# Patient Record
Sex: Female | Born: 1960 | Hispanic: Yes | Smoking: Former smoker
Health system: Southern US, Community
[De-identification: ages and names within clinical notes are randomized; demographics above are authoritative.]

## PROBLEM LIST (undated history)

## (undated) DIAGNOSIS — G20 Parkinson's disease: Secondary | ICD-10-CM

## (undated) DIAGNOSIS — E079 Disorder of thyroid, unspecified: Secondary | ICD-10-CM

## (undated) DIAGNOSIS — I10 Essential (primary) hypertension: Secondary | ICD-10-CM

## (undated) DIAGNOSIS — G20A1 Parkinson's disease without dyskinesia, without mention of fluctuations: Secondary | ICD-10-CM

## (undated) DIAGNOSIS — G43909 Migraine, unspecified, not intractable, without status migrainosus: Secondary | ICD-10-CM

## (undated) DIAGNOSIS — N809 Endometriosis, unspecified: Secondary | ICD-10-CM

## (undated) HISTORY — DX: Disorder of thyroid, unspecified: E07.9

## (undated) HISTORY — PX: OTHER SURGICAL HISTORY: SHX169

## (undated) HISTORY — PX: APPENDECTOMY: SHX54

## (undated) HISTORY — PX: CERVICAL SPINE SURGERY: SHX589

## (undated) HISTORY — PX: RIGHT OOPHORECTOMY: SHX2359

## (undated) HISTORY — DX: Essential (primary) hypertension: I10

## (undated) HISTORY — DX: Migraine, unspecified, not intractable, without status migrainosus: G43.909

## (undated) HISTORY — DX: Parkinson's disease: G20

## (undated) HISTORY — DX: Parkinson's disease without dyskinesia, without mention of fluctuations: G20.A1

## (undated) HISTORY — DX: Endometriosis, unspecified: N80.9

---

## 2020-08-06 ENCOUNTER — Telehealth: Payer: Self-pay | Admitting: Orthopedic Surgery

## 2020-08-06 NOTE — Telephone Encounter (Signed)
Pt called and is wondering if you have received her medical records?  Cb [id number]

## 2020-08-11 NOTE — Telephone Encounter (Signed)
IC confirmed had paper records. She will bring CD with images to her appt

## 2020-08-23 ENCOUNTER — Ambulatory Visit: Payer: Self-pay | Admitting: Orthopedic Surgery

## 2020-08-25 ENCOUNTER — Ambulatory Visit (INDEPENDENT_AMBULATORY_CARE_PROVIDER_SITE_OTHER): Payer: 59 | Admitting: Orthopedic Surgery

## 2020-08-25 ENCOUNTER — Other Ambulatory Visit: Payer: Self-pay

## 2020-08-25 DIAGNOSIS — M792 Neuralgia and neuritis, unspecified: Secondary | ICD-10-CM

## 2020-08-25 MED ORDER — HYDROCODONE-ACETAMINOPHEN 7.5-325 MG PO TABS: ORAL_TABLET | ORAL | 0 refills | Status: DC

## 2020-08-27 ENCOUNTER — Encounter: Payer: Self-pay | Admitting: Orthopedic Surgery

## 2020-08-27 NOTE — Progress Notes (Signed)
Office Visit Note   Patient: Bianca Goodman           Date of Birth: 14-Nov-1960           MRN: [id number] Visit Date: 08/25/2020 Requested by: No referring provider defined for this encounter. PCP: No primary care provider on file.  Subjective: Chief Complaint  Patient presents with  . Neck - Pain    With left radicular arm pain Prior neck surgery done in [place]     HPI: Patient presents for evaluation of neck pain and left-sided radicular pain.  Pain has been going on for about 6 months.  She has had prior cervical surgery in 2005.  MRI scan done of the cervical spine in [place] from September 2021 is reviewed.  That scan is relatively low magnet strength in not the greatest quality.  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  Patient describes radicular pain with numbness and tingling in the left hand.  She also has been taking pain medicine 3 times a day last prescribed by physician in [place] 07/12/2020.  I did tell her that we do not do chronic long-term pain management.  She will need to either find pain medicine here or begin tapering with a primary care provider.  She also describes having an injury in her youth which required some type of surgery in the axilla.  She describes some weakness in the left hand since that time.              ROS: All systems reviewed are negative as they relate to the chief complaint within the history of present illness.  Patient denies  fevers or chills.   Assessment & Plan: Visit Diagnoses:  1. 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  I am not sure if she is interested in more surgical intervention.  I think she is interested in continuing with her pain medicine regimen which we do not do on a chronic basis here at this clinic which is described to her in detail.  Nonetheless I think she does need a better  MRI scan so that we could potentially consider injections for her.  Nerve conduction to evaluate ulnar neuropathy is indicated.  MRI C-spine on an open scanner to evaluate left-sided radiculopathy with referral to pain medicine management is also performed.  One-time prescription for Norco provided which will not be refilled.  Follow-Up Instructions: Return for after MRI.   Orders:  Orders Placed This Encounter  Procedures  . MR Cervical Spine w/o contrast  . Ambulatory referral to Physical Medicine Rehab  . Ambulatory referral to Pain Clinic   Meds ordered this encounter  Medications  . HYDROcodone-acetaminophen (NORCO) 7.5-325 MG tablet    Sig: 1 po bid prn pain    Dispense:  30 tablet    Refill:  0      Procedures: No procedures performed   Clinical Data: No additional findings.  Objective: Vital Signs: There were no vitals taken for this visit.  Physical Exam:   Constitutional: Patient appears well-developed HEENT:  Head: Normocephalic Eyes:EOM are normal Neck: Normal range of motion Cardiovascular: Normal rate Pulmonary/chest: Effort normal Neurologic: Patient is alert Skin: Skin is warm Psychiatric: Patient has normal mood and affect    Ortho Exam: Ortho exam demonstrates well-healed surgical incision in the axilla on the left-hand side.  She does  have weakness with small finger abduction on the left compared to the right.  Otherwise her wrist flexion and extension strength is symmetric between arms.  Radial pulses intact.  Does have some paresthesias in the C6 distribution on the left-hand side as well.  Bicep tricep deltoid strength intact.  Rotator cuff strength intact bilaterally.  Well-healed surgical incision in the neck but her range of motion is more restricted than I would expect from a single level fusion.  Flexion is about 2 inches chin to chest extension is about 20 degrees rotation is 35 degrees bilaterally.  Specialty Comments:  No specialty  comments available.  Imaging: No results found.   PMFS History: There are no problems to display for this patient.  History reviewed. No pertinent past medical history.  History reviewed. No pertinent family history.  History reviewed. No pertinent surgical history. Social History   Occupational History  . Not on file  Tobacco Use  . Smoking status: Not on file  . Smokeless tobacco: Not on file  Substance and Sexual Activity  . Alcohol use: Not on file  . Drug use: Not on file  . Sexual activity: Not on file

## 2020-08-30 ENCOUNTER — Telehealth: Payer: Self-pay | Admitting: Orthopedic Surgery

## 2020-08-30 ENCOUNTER — Telehealth: Payer: Self-pay

## 2020-08-30 NOTE — Telephone Encounter (Signed)
Patient called she is requesting rx refill for hydrocodone until her appointment with Alvester Morin, patient also stated her insurance told her they have received any information from [place] regarding a referral, she is requesting the referral to be resubmitted call back:[phone]

## 2020-08-30 NOTE — Telephone Encounter (Signed)
Patient called requesting pain medication. Please send to pharmacy [facility]. Patient phone number is [phone].

## 2020-08-30 NOTE — Telephone Encounter (Signed)
Patient calling again.  Please advise

## 2020-08-30 NOTE — Telephone Encounter (Signed)
Please advise on pain medication. 

## 2020-08-30 NOTE — Telephone Encounter (Signed)
Okay to resubmit referrals from last visit if needed. No refill of pain medication per Dr. Diamantina Providence last note

## 2020-08-31 ENCOUNTER — Telehealth: Payer: Self-pay

## 2020-08-31 ENCOUNTER — Encounter: Payer: Self-pay | Admitting: Physical Medicine & Rehabilitation

## 2020-08-31 NOTE — Telephone Encounter (Signed)
She wanted me to let you know she doesn't have an appointment with her PCP until the 26th And not with pain mgmt until 5/10

## 2020-08-31 NOTE — Telephone Encounter (Signed)
Her last prescription was only supposed to be a one-time RX, she will need to discuss pain medication with her PCP or when she has her pain management appointment from pain management referral.

## 2020-08-31 NOTE — Telephone Encounter (Signed)
Pt called again asking about her medication refill

## 2020-08-31 NOTE — Telephone Encounter (Signed)
Can you please call and advise of below? I tried calling but patient does not speak [place]. Thanks.

## 2020-08-31 NOTE — Telephone Encounter (Signed)
Patient called she wanted to let Dr.Dean know that she has a appointment with Dr.Kristeins may 10th . Patient is also asking for rx refill for hydrocodone call back:[phone] ** patient speaks spanish and would like a call back when rx has been sent to pharmacy

## 2020-09-01 NOTE — Telephone Encounter (Signed)
States she just move from [place]. Has new  upcomming appointments.  1. PCP 09/14/2020  2. Pain management  Sep 28, 2020   Pharmacy: [facility] [address]   Would like hydrocodone RF

## 2020-09-02 NOTE — Telephone Encounter (Signed)
Ok for norco 1 po q bid for 10 days # 20 thx

## 2020-09-06 ENCOUNTER — Other Ambulatory Visit: Payer: Self-pay | Admitting: Surgical

## 2020-09-06 MED ORDER — HYDROCODONE-ACETAMINOPHEN 5-325 MG PO TABS: 1.0000 | ORAL_TABLET | Freq: Two times a day (BID) | ORAL | 0 refills | Status: DC | PRN

## 2020-09-06 NOTE — Telephone Encounter (Signed)
See below. Can you send this in?

## 2020-09-06 NOTE — Telephone Encounter (Signed)
Sent in

## 2020-09-13 ENCOUNTER — Other Ambulatory Visit: Payer: Self-pay

## 2020-09-14 ENCOUNTER — Ambulatory Visit: Payer: Self-pay | Admitting: Family Medicine

## 2020-09-14 DIAGNOSIS — M792 Neuralgia and neuritis, unspecified: Secondary | ICD-10-CM | POA: Insufficient documentation

## 2020-09-14 DIAGNOSIS — Z0289 Encounter for other administrative examinations: Secondary | ICD-10-CM

## 2020-09-28 ENCOUNTER — Encounter: Payer: 59 | Attending: Physical Medicine & Rehabilitation | Admitting: Physical Medicine & Rehabilitation

## 2020-09-28 ENCOUNTER — Encounter: Payer: Self-pay | Admitting: Physical Medicine & Rehabilitation

## 2020-09-28 ENCOUNTER — Other Ambulatory Visit: Payer: Self-pay

## 2020-09-28 VITALS — BP 138/84 | HR 88 | Temp 98.6°F | Ht 64.5 in | Wt 162.6 lb

## 2020-09-28 DIAGNOSIS — G8928 Other chronic postprocedural pain: Secondary | ICD-10-CM | POA: Diagnosis present

## 2020-09-28 DIAGNOSIS — R2232 Localized swelling, mass and lump, left upper limb: Secondary | ICD-10-CM | POA: Diagnosis not present

## 2020-09-28 MED ORDER — GABAPENTIN 300 MG PO CAPS: 300.0000 mg | ORAL_CAPSULE | Freq: Two times a day (BID) | ORAL | 1 refills | Status: DC

## 2020-09-28 NOTE — Patient Instructions (Signed)
Sndrome de dolor regional complejo Complex Regional Pain Syndrome El sndrome de dolor regional complejo [facility]) es un trastorno nervioso que causa dolor a largo plazo (crnico). Generalmente ocurre en [employer], un brazo, un pie o una pierna. El SDRC generalmente ocurre despus de una lesin o un traumatismo, como una fractura o un esguince. Hay dos tipos de SDRC:  Tipo1. Este tipo ocurre despus de una lesin sin que se produzca un dao conocido a un nervio.  Tipo2. Este tipo ocurre a causa de un dao a un nervio despus de una lesin. La afeccin tiene tres fases:  Fase 1. Esta fase, denominada fase aguda, puede durar hasta 3 meses.  Fase 2. Esta fase, denominada fase distrfica, puede durar de 3 a 12 meses.  Fase 3. Esta fase, denominada fase atrfica, puede comenzar despus de un ao. El SDRC vara de leve a grave. En la [employer], [place] SDRC es leve y la recuperacin se produce con el [place]. En otras, el SDRC dura [employer], y IT trainer la realizacin de las tareas cotidianas. Cules son las causas? Se desconoce la causa exacta de esta afeccin. Generalmente el desencadenante es una lesin. Qu incrementa el riesgo? Es ms probable que sufra esta afeccin si:  Es mujer.  Tiene alguna de las siguientes lesiones: ? Market researcher de la mueca que abarca la parte inferior del hueso del brazo (fractura distal del radio). ? Una luxacin o fractura del tobillo. ? Una ciruga que dura [employer]. ? Una posible lesin en los nervios durante [place]. Cules son los signos o los sntomas? Los signos y sntomas en la mano, brazo, pie o pierna afectados son diferentes en cada fase. Los signos y sntomas de la fase 1 incluyen los siguientes:  Dolor urente.  Picazn, sensacin de hormigueo (sensacin de pinchazos).  Piel extremadamente sensible.  Hinchazn.  Rigidez articular.  Enrojecimiento y Airline pilot.  Sudoracin excesiva.  El crecimiento del cabello y las  uas es ms rpido que lo normal. Los signos y sntomas de la fase 2 incluyen los siguientes:  Propagacin del Engineer, mining a todo el brazo o la pierna.  Mayor sensibilidad de la piel.  Aumento de la hinchazn y la rigidez.  Piel fra.  Pigmentacin azulada de la piel.  Prdida de arrugas de la piel.  Uas quebradizas. Los signos y sntomas de la fase 3 incluyen los siguientes:  El dolor se propaga a otras reas del cuerpo pero se torna menos intenso.  Mayor rigidez, que causa la prdida de [place].  Piel plida, seca, brillante o tirante. Cmo se diagnostica? Esta afeccin se puede diagnosticar en funcin de lo siguiente:  Sus signos y sntomas.  Un examen fsico. No existe ninguna prueba para diagnosticar el SDRC, pero es posible que le realicen estudios:  Para Armed forces logistics/support/administrative officer en los huesos que podran indicar el SDRC. Estos estudios pueden incluir una resonancia magntica (RM) o una gammagrafa sea.  Estudios para Teacher, early years/pre posibles causas de los sntomas. Cmo se trata? El tratamiento temprano puede evitar que el SDRC supere la fase 1. No hay un solo tratamiento que funcione para todos. Las opciones de tratamiento pueden incluir:  Medicamentos, que [employer] siguientes: ? Antiinflamatorios no esteroideos (AINE), como el ibuprofeno. ? Corticoesteroides. ? Medicamentos para la presin arterial. ? Antidepresivos. ? Anticonvulsivos. ? Analgsicos.  Realizar actividad fsica.  Terapia ocupacional (TO) y fisioterapia (FT).  Biorretroalimentacin.  Asesoramiento en salud mental.  Inyecciones de anestsicos.  Ciruga espinal para implantar un estimulador de  mdula espinal o una bomba para Engineer, materials.   Siga estas instrucciones en su casa: Medicamentos  Use los medicamentos de venta libre y los recetados solamente como se lo haya indicado el mdico.  No conduzca ni use maquinaria pesada mientras toma analgsicos recetados.  Si toma analgsicos  recetados, adopte medidas para prevenir o tratar el estreimiento. El mdico podra recomendarle que haga lo siguiente: ? Beber suficiente lquido como para mantener la orina de color amarillo plido. ? Consumir alimentos ricos en fibra, como frutas y verduras frescas, cereales integrales y frijoles. ? Limitar el consumo de alimentos ricos en grasa y azcares procesados, como los alimentos fritos o dulces. ? Tomar un medicamento recetado o de venta libre para el estreimiento. Instrucciones generales  No consuma ningn producto que contenga nicotina o tabaco, como cigarrillos y Administrator, Civil Service. Si necesita ayuda para dejar de fumar, consulte al mdico.  Mantn un peso saludable.  Retome sus actividades normales segn lo indicado por el mdico. Pregntele al mdico qu actividades son seguras para usted.  Siga un programa de ejercicios como se lo haya indicado el mdico.  Concurra a todas las visitas de seguimiento como se lo haya indicado el mdico. Esto es importante. Comunquese con un mdico si:  Los sntomas [place].  Sus sntomas empeoran.  Desarrolla ansiedad o depresin. Resumen  El sndrome de dolor regional complejo [facility]) es un trastorno nervioso que causa dolor a largo plazo (crnico), generalmente en Bianca Goodman, un brazo, una pierna o un pie.  El SDRC generalmente ocurre despus de una lesin o un traumatismo, como una fractura o un esguince.  El SDRC vara de leve a grave. El tratamiento temprano puede evitar que el SDRC progrese a fases ms graves. Esta informacin no tiene Theme park manager el consejo del mdico. Asegrese de hacerle al mdico cualquier pregunta que tenga. Document Revised: 03/17/2020 Document Reviewed: 03/17/2020 Elsevier Patient Education  2021 [employer].

## 2020-09-28 NOTE — Progress Notes (Signed)
Subjective:    Patient ID: Bianca Goodman, female    DOB: May 22, 1961, 60 y.o.   MRN: [id number]  HPI  CC:  Left sided neck pain and Left hand pain 60 year old female with a long history of chronic pain that involves the left upper extremity.  She states that her pain has worsened over the last 6 to 12 months . Spanish interpreter is assisting since patient has limited [place].  Anterior scalene syndrome diagnosed in 1985 , 1st rib resection left side in the [employer] 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 Sensation reduced in Left little finger chronically since the first rib resection  The patient is also concerned that she has a hand tremor on the left side, this is new over the last 6 months.   Also had left neck pain and had C spine surgery in 2005, the patient indicates that she had screws placed as well as bone but does not remember what level.  The patient relocated to [facility] in the last 6 months.  She has been going to a pain management clinic in [place] where she tried several medications including tramadol, hydrocodone, pregabalin and gabapentin.  She states that the combination between tramadol and gabapentin or hydrocodone and gabapentin were helpful. The patient has noted swelling in the knuckles of the left index and middle finger denies any swelling in other joints of the body.  Pain Inventory Average Pain 8 Pain Right Now 8 My pain is constant and sharp  In the last 24 hours, has pain interfered with the following? General activity 7 Relation with others 7 Enjoyment of life 7 What TIME of day is your pain at its worst? daytime Sleep (in general) Poor  Pain is worse with: walking and some activites Pain improves with: rest and medication Relief from Meds: 5  ability to climb steps?  no do you drive?  no  not employed: date last employed . I need assistance with the following:   dressing and meal prep  numbness tremor tingling  Any changes since last visit?  yes  Any changes since last visit?  yes    No family history on file. Social History   Socioeconomic History  . Marital status: Single    Spouse name: Not on file  . Number of children: Not on file  . Years of education: Not on file  . Highest education level: Not on file  Occupational History  . Not on file  Tobacco Use  . Smoking status: Not on file  . Smokeless tobacco: Not on file  Substance and Sexual Activity  . Alcohol use: Not on file  . Drug use: Not on file  . Sexual activity: Not on file  Other Topics Concern  . Not on file  Social History Narrative  . Not on file   Social Determinants of Health   Financial Resource Strain: Not on file  Food Insecurity: Not on file  Transportation Needs: Not on file  Physical Activity: Not on file  Stress: Not on file  Social Connections: Not on file   No past surgical history on file. No past medical history on file. BP 138/84   Pulse 88   Temp 98.6 F (37 C)   Ht 5' 4.5" (1.638 m)   Wt 162 lb 9.6 oz (73.8 kg)   SpO2 97%   BMI 27.48 kg/m   Opioid  Risk Score:   Fall Risk Score:  `1  Depression screen PHQ 2/9  Depression screen PHQ 2/9 09/28/2020  Decreased Interest 1  Down, Depressed, Hopeless 2  PHQ - 2 Score 3  Altered sleeping 0  Tired, decreased energy 1  Change in appetite 1  Feeling bad or failure about yourself  0  Trouble concentrating 0  Moving slowly or fidgety/restless 0  Suicidal thoughts 0  PHQ-9 Score 5  Difficult doing work/chores Very difficult     Review of Systems  Musculoskeletal:       Left arm  Neurological: Positive for tremors and numbness.       Tingling  All other systems reviewed and are negative.      Objective:   Physical Exam Vitals and nursing note reviewed.  Constitutional:      Appearance: She is normal weight.  HENT:     Head: Normocephalic and atraumatic.  Eyes:      Extraocular Movements: Extraocular movements intact.     Conjunctiva/sclera: Conjunctivae normal.     Pupils: Pupils are equal, round, and reactive to light.  Cardiovascular:     Heart sounds: Normal heart sounds. No murmur heard.   Pulmonary:     Effort: Pulmonary effort is normal. No respiratory distress.     Breath sounds: Normal breath sounds. No stridor.  Abdominal:     General: Abdomen is flat. Bowel sounds are normal. There is no distension.     Palpations: Abdomen is soft. There is no mass.  Musculoskeletal:     Comments: There is swelling of the left second and third MCPs.  Tenderness to palpation no erythema there is pain with range of motion at those joints as well patient also has decreased PIP flexion in the left hand.  Neurological:     General: No focal deficit present.     Mental Status: She is alert and oriented to person, place, and time.     Cranial Nerves: No facial asymmetry.     Sensory: Sensory deficit present.     Motor: Weakness and tremor present.     Coordination: Coordination abnormal.     Gait: Gait normal.     Comments: Left C8 reduce sensitivity to pinprick and light touch  Motor strength is 5/5 in the right deltoid bicep tricep grip hip flexor knee extensor ankle dorsiflexor as well as left hip flexor knee extensor ankle dorsiflexor Left upper extremity 3 - at the deltoid related to pain 4 at the biceps triceps 3 - at the wrist flexors and extensors as well as finger flexors and extensors   Psychiatric:        Mood and Affect: Mood normal.        Behavior: Behavior normal.    Hypersensitive to touch over the left hand and movement allodynia left hand  There is tenderness palpation left upper trapezius left occipital area, left levator area left periscapular area as well as left latissimus     Assessment & Plan:  1.  Left upper extremity pain multifactorial The patient has a history of thoracic outlet syndrome and has undergone first rib  resection as well as anterior scalene resection in 1985 and has had some numbness of her left little finger since that time. 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  It is difficult to tell whether her symptoms are new or whether she just came off her medications and now is more symptomatic in the left upper extremity.  She states that she was actually working in the fall of last year but now is having difficulty even getting her close on and off using her left hand.  Agree with EMG/NCV left upper extremity  In addition patient has MCP swelling in second and third MCPs on the left side.  Will check arthritis panel.  We discussed medication management with this patient will start gabapentin 300 mg twice daily, will check urine drug screen and if there are no undisclosed opiates or illicit drugs, or other controlled substances we can start tramadol 50 mg twice daily and titrate from there.  I will see back in 1 month hopefully will have additional diagnostic work-up by that time

## 2020-09-29 LAB — ARTHRITIS PANEL
Anti Nuclear Antibody (ANA): NEGATIVE
Rheumatoid fact SerPl-aCnc: <10 IU/mL (ref <14.0)
Sed Rate: 22 mm/hr (ref 0–40)
Uric Acid: 3.9 mg/dL (ref 3.0–7.2)

## 2020-10-13 ENCOUNTER — Ambulatory Visit (INDEPENDENT_AMBULATORY_CARE_PROVIDER_SITE_OTHER): Payer: 59 | Admitting: Physical Medicine and Rehabilitation

## 2020-10-13 ENCOUNTER — Encounter: Payer: Self-pay | Admitting: Physical Medicine and Rehabilitation

## 2020-10-13 ENCOUNTER — Telehealth: Payer: Self-pay

## 2020-10-13 ENCOUNTER — Other Ambulatory Visit: Payer: Self-pay

## 2020-10-13 DIAGNOSIS — R202 Paresthesia of skin: Secondary | ICD-10-CM | POA: Diagnosis not present

## 2020-10-13 NOTE — Telephone Encounter (Signed)
Patient aware Dr. Wynn Banker is not in the office today.  Bianca Goodman called back.: Patient stated that she still has pain in her left hand with swelling, & left arm pain. She wanted to know if she could have something for the pain?   The Gabapentin is not helping. Pain is still a at level 9/10.  Patient also advised to seek help at an Urgent Care or PCP.  Since Dr. Wynn Banker is not in the office at this time.   Call back phone [phone].

## 2020-10-13 NOTE — Progress Notes (Signed)
Pt state left handed pain and shaking. Pt state she can't use her left hand due to not having any strength and hand is swollen. Pt state she uses heat and pain meds to help ease her pain. Pt state she right handed.  Numeric Pain Rating Scale and Functional Assessment Average Pain 8   In the last MONTH (on 0-10 scale) has pain interfered with the following?  1. General activity like being  able to carry out your everyday physical activities such as walking, climbing stairs, carrying groceries, or moving a chair?  Rating(8)

## 2020-10-14 NOTE — Progress Notes (Signed)
Bianca Goodman - 60 y.o. female MRN [id number]  Date of birth: Apr 13, 1961  Office Visit Note: Visit Date: 10/13/2020 PCP: Pcp, No Referred by: Cammy Copa, MD  Subjective: Chief Complaint  Patient presents with  . Left Hand - Pain   HPI:  Bianca Goodman is a 60 y.o. female who comes in today at the request of Dr. Burnard Bunting for electrodiagnostic study of the Left upper extremities.  Patient is Right hand dominant.  Patient does have Spanish interpreter today but still has somewhat difficult interview today.  Notes reviewed from Dr. Claudette Laws and Dr. Burnard Bunting in our office.  Patient had history of left-sided thoracic outlet syndrome with some type of first rib removal surgery in the 1980s to early 90s.  Since that time has had left arm pain with paresthesia.  Went on to have cervical fusion which was an ACDF at C4-5.  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  Dr. Raphael Gibney felt like she was having some radicular component to the pain or possibly ulnar nerve as most of the symptoms are in the fourth and fifth digit.  She describes tremors and shaking in the left hand and weakness and the inability to move her arm because of the pain and weakness.  She has noted some swelling in the hands and knuckles particularly the first 2-3 MCP joints.  Dr. Wynn Banker felt like she may be had some level of complex regional pain syndrome as well as potential for radicular pain or ulnar pain.  He has no prior electrodiagnostic study to review.  She was started on gabapentin by Dr. Wynn Banker but she reports today that she is not taking that.   ROS Otherwise per HPI.  Assessment & Plan: Visit Diagnoses:    ICD-10-CM   1. Paresthesia of skin  R20.2 NCV with EMG (electromyography)    Plan: Impression: Essentially NORMAL electrodiagnostic study of the right upper limb.  There is no significant electrodiagnostic evidence of nerve entrapment, brachial plexopathy or cervical radiculopathy.  As you  know, purely sensory or demyelinating radiculopathies and chemical radiculitis may not be detected with this particular electrodiagnostic study. **This electrodiagnostic study cannot rule out small fiber polyneuropathy and dysesthesias from central pain syndromes such as stroke or central pain sensitization syndromes such as fibromyalgia.  Myotomal referral pain from trigger points is also not excluded.  Could have some symptoms of complex regional pain syndrome but does not fully meet the [place] criteria.  Recommendations: 1.  Follow-up with referring physician. 2.  Continue current management of symptoms.  Consider consultation with neurosurgery or neurology.  Meds & Orders: No orders of the defined types were placed in this encounter.   Orders Placed This Encounter  Procedures  . NCV with EMG (electromyography)    Follow-up: Return in about 2 weeks (around 10/27/2020) for  G. Dorene Grebe, MD.   Procedures: No procedures performed  EMG & NCV Findings: Evaluation of the left median (across palm) sensory nerve showed reduced amplitude (6.2 V).  All remaining nerves (as indicated in the following tables) were within normal limits.    All examined muscles (as indicated in the following table) showed no evidence of electrical instability.    Impression: Essentially NORMAL electrodiagnostic study of the right upper limb.  There is no significant electrodiagnostic evidence of nerve entrapment, brachial plexopathy or cervical radiculopathy.  As you know, purely sensory or demyelinating radiculopathies and chemical radiculitis may not be detected with this particular electrodiagnostic study. **This  electrodiagnostic study cannot rule out small fiber polyneuropathy and dysesthesias from central pain syndromes such as stroke or central pain sensitization syndromes such as fibromyalgia.  Myotomal referral pain from trigger points is also not excluded.  Could have some symptoms of complex regional  pain syndrome but does not fully meet the [place] criteria.  Recommendations: 1.  Follow-up with referring physician. 2.  Continue current management of symptoms.  Consider consultation with neurosurgery or neurology.  ___________________________ Naaman Plummer FAAPMR Board Certified, American Board of Physical Medicine and Rehabilitation    Nerve Conduction Studies Anti Sensory Summary Table   Stim Site NR Peak (ms) Norm Peak (ms) P-T Amp (V) Norm P-T Amp Site1 Site2 Delta-P (ms) Dist (cm) Vel (m/s) Norm Vel (m/s)  Left Median Acr Palm Anti Sensory (2nd Digit)  29.9C  Wrist    3.1 <3.6 *6.2 >10 Wrist Palm 1.5 0.0    Palm    1.6 <2.0 19.7         Left Radial Anti Sensory (Base 1st Digit)  30.4C  Wrist    1.9 <3.1 14.6  Wrist Base 1st Digit 1.9 0.0    Left Ulnar Anti Sensory (5th Digit)  30.4C  Wrist    3.2 <3.7 22.6 >15.0 Wrist 5th Digit 3.2 14.0 44 >38   Motor Summary Table   Stim Site NR Onset (ms) Norm Onset (ms) O-P Amp (mV) Norm O-P Amp Site1 Site2 Delta-0 (ms) Dist (cm) Vel (m/s) Norm Vel (m/s)  Left Median Motor (Abd Poll Brev)  30.5C  Wrist    3.2 <4.2 7.7 >5 Elbow Wrist 2.4 17.5 73 >50  Elbow    5.6  7.9         Left Ulnar Motor (Abd Dig Min)  30.5C  Wrist    3.0 <4.2 9.3 >3 B Elbow Wrist 2.7 17.0 63 >53  B Elbow    5.7  3.8  A Elbow B Elbow 1.6 9.0 56 >53  A Elbow    7.3  4.5          EMG   Side Muscle Nerve Root Ins Act Fibs Psw Amp Dur Poly Recrt Int Dennie Bible Comment  Left 1stDorInt Ulnar C8-T1 Nml Nml Nml Nml Nml 0 Nml Nml   Left Abd Poll Brev Median C8-T1 Nml Nml Nml Nml Nml 0 Nml Nml   Left ExtDigCom   Nml Nml Nml Nml Nml 0 Nml Nml   Left Triceps Radial C6-7-8 Nml Nml Nml Nml Nml 0 Nml Nml   Left Deltoid Axillary C5-6 Nml Nml Nml Nml Nml 0 Nml Nml     Nerve Conduction Studies Anti Sensory Left/Right Comparison   Stim Site L Lat (ms) R Lat (ms) L-R Lat (ms) L Amp (V) R Amp (V) L-R Amp (%) Site1 Site2 L Vel (m/s) R Vel (m/s) L-R Vel (m/s)  Median Acr  Palm Anti Sensory (2nd Digit)  29.9C  Wrist 3.1   *6.2   Wrist Palm     Palm 1.6   19.7         Radial Anti Sensory (Base 1st Digit)  30.4C  Wrist 1.9   14.6   Wrist Base 1st Digit     Ulnar Anti Sensory (5th Digit)  30.4C  Wrist 3.2   22.6   Wrist 5th Digit 44     Motor Left/Right Comparison   Stim Site L Lat (ms) R Lat (ms) L-R Lat (ms) L Amp (mV) R Amp (mV) L-R Amp (%) Site1 Site2 L Vel (  m/s) R Vel (m/s) L-R Vel (m/s)  Median Motor (Abd Poll Brev)  30.5C  Wrist 3.2   7.7   Elbow Wrist 73    Elbow 5.6   7.9         Ulnar Motor (Abd Dig Min)  30.5C  Wrist 3.0   9.3   B Elbow Wrist 63    B Elbow 5.7   3.8   A Elbow B Elbow 56    A Elbow 7.3   4.5            Waveforms:             Clinical History: No specialty comments available.     Objective:  VS:  HT:    WT:   BMI:     BP:   HR: bpm  TEMP: ( )  RESP:  Physical Exam Musculoskeletal:        General: Tenderness present. No swelling or deformity.     Comments: Inspection reveals no atrophy of the bilateral APB or FDI or hand intrinsics. There is no swelling, color changes or dystrophic changes but there is hyperesthesia and some allodynia with the patient really avoiding moving the arm and hand at all.  Exam is somewhat difficult.  Strength exam is very difficult any movement of the arm even at the shoulder or elbow at the hand causes pain.  The fourth and fifth digit crossover with the fifth digit crossing over the top of the fourth digit.  There is significant swelling or enlargement of the first 2-3 knuckles of the hand.  No redness associated with that.  She does not exhibit Wartenberg's sign or Benedictine sign or clawing.  There is some level of hypoesthesia or allodynia.   Skin:    General: Skin is warm and dry.     Findings: No erythema or rash.  Neurological:     General: No focal deficit present.     Mental Status: She is alert and oriented to person, place, and time.     Cranial Nerves: No cranial  nerve deficit.     Sensory: Sensory deficit present.     Motor: No weakness or abnormal muscle tone.     Coordination: Coordination abnormal.  Psychiatric:        Mood and Affect: Mood normal.        Behavior: Behavior normal.      Imaging: No results found.

## 2020-10-14 NOTE — Procedures (Signed)
EMG & NCV Findings: Evaluation of the left median (across palm) sensory nerve showed reduced amplitude (6.2 V).  All remaining nerves (as indicated in the following tables) were within normal limits.    All examined muscles (as indicated in the following table) showed no evidence of electrical instability.    Impression: Essentially NORMAL electrodiagnostic study of the right upper limb.  There is no significant electrodiagnostic evidence of nerve entrapment, brachial plexopathy or cervical radiculopathy.  As you know, purely sensory or demyelinating radiculopathies and chemical radiculitis may not be detected with this particular electrodiagnostic study. **This electrodiagnostic study cannot rule out small fiber polyneuropathy and dysesthesias from central pain syndromes such as stroke or central pain sensitization syndromes such as fibromyalgia.  Myotomal referral pain from trigger points is also not excluded.  Could have some symptoms of complex regional pain syndrome but does not fully meet the [place] criteria.  Recommendations: 1.  Follow-up with referring physician. 2.  Continue current management of symptoms.  Consider consultation with neurosurgery or neurology.  ___________________________ Naaman Plummer FAAPMR Board Certified, American Board of Physical Medicine and Rehabilitation    Nerve Conduction Studies Anti Sensory Summary Table   Stim Site NR Peak (ms) Norm Peak (ms) P-T Amp (V) Norm P-T Amp Site1 Site2 Delta-P (ms) Dist (cm) Vel (m/s) Norm Vel (m/s)  Left Median Acr Palm Anti Sensory (2nd Digit)  29.9C  Wrist    3.1 <3.6 *6.2 >10 Wrist Palm 1.5 0.0    Palm    1.6 <2.0 19.7         Left Radial Anti Sensory (Base 1st Digit)  30.4C  Wrist    1.9 <3.1 14.6  Wrist Base 1st Digit 1.9 0.0    Left Ulnar Anti Sensory (5th Digit)  30.4C  Wrist    3.2 <3.7 22.6 >15.0 Wrist 5th Digit 3.2 14.0 44 >38   Motor Summary Table   Stim Site NR Onset (ms) Norm Onset (ms) O-P Amp  (mV) Norm O-P Amp Site1 Site2 Delta-0 (ms) Dist (cm) Vel (m/s) Norm Vel (m/s)  Left Median Motor (Abd Poll Brev)  30.5C  Wrist    3.2 <4.2 7.7 >5 Elbow Wrist 2.4 17.5 73 >50  Elbow    5.6  7.9         Left Ulnar Motor (Abd Dig Min)  30.5C  Wrist    3.0 <4.2 9.3 >3 B Elbow Wrist 2.7 17.0 63 >53  B Elbow    5.7  3.8  A Elbow B Elbow 1.6 9.0 56 >53  A Elbow    7.3  4.5          EMG   Side Muscle Nerve Root Ins Act Fibs Psw Amp Dur Poly Recrt Int Dennie Bible Comment  Left 1stDorInt Ulnar C8-T1 Nml Nml Nml Nml Nml 0 Nml Nml   Left Abd Poll Brev Median C8-T1 Nml Nml Nml Nml Nml 0 Nml Nml   Left ExtDigCom   Nml Nml Nml Nml Nml 0 Nml Nml   Left Triceps Radial C6-7-8 Nml Nml Nml Nml Nml 0 Nml Nml   Left Deltoid Axillary C5-6 Nml Nml Nml Nml Nml 0 Nml Nml     Nerve Conduction Studies Anti Sensory Left/Right Comparison   Stim Site L Lat (ms) R Lat (ms) L-R Lat (ms) L Amp (V) R Amp (V) L-R Amp (%) Site1 Site2 L Vel (m/s) R Vel (m/s) L-R Vel (m/s)  Median Acr Palm Anti Sensory (2nd Digit)  29.9C  Wrist 3.1   *  6.2   Wrist Palm     Palm 1.6   19.7         Radial Anti Sensory (Base 1st Digit)  30.4C  Wrist 1.9   14.6   Wrist Base 1st Digit     Ulnar Anti Sensory (5th Digit)  30.4C  Wrist 3.2   22.6   Wrist 5th Digit 44     Motor Left/Right Comparison   Stim Site L Lat (ms) R Lat (ms) L-R Lat (ms) L Amp (mV) R Amp (mV) L-R Amp (%) Site1 Site2 L Vel (m/s) R Vel (m/s) L-R Vel (m/s)  Median Motor (Abd Poll Brev)  30.5C  Wrist 3.2   7.7   Elbow Wrist 73    Elbow 5.6   7.9         Ulnar Motor (Abd Dig Min)  30.5C  Wrist 3.0   9.3   B Elbow Wrist 63    B Elbow 5.7   3.8   A Elbow B Elbow 56    A Elbow 7.3   4.5            Waveforms:

## 2020-10-19 NOTE — Telephone Encounter (Signed)
Patient informed. 

## 2020-10-26 ENCOUNTER — Encounter: Payer: 59 | Attending: Registered Nurse | Admitting: Registered Nurse

## 2020-10-26 ENCOUNTER — Other Ambulatory Visit: Payer: Self-pay

## 2020-10-26 VITALS — BP 114/76 | HR 77 | Temp 98.0°F | Ht 64.0 in | Wt 163.4 lb

## 2020-10-26 DIAGNOSIS — Z79899 Other long term (current) drug therapy: Secondary | ICD-10-CM | POA: Insufficient documentation

## 2020-10-26 DIAGNOSIS — M542 Cervicalgia: Secondary | ICD-10-CM | POA: Diagnosis present

## 2020-10-26 DIAGNOSIS — G894 Chronic pain syndrome: Secondary | ICD-10-CM | POA: Insufficient documentation

## 2020-10-26 DIAGNOSIS — Z5181 Encounter for therapeutic drug level monitoring: Secondary | ICD-10-CM | POA: Insufficient documentation

## 2020-10-26 DIAGNOSIS — M792 Neuralgia and neuritis, unspecified: Secondary | ICD-10-CM | POA: Diagnosis not present

## 2020-10-26 DIAGNOSIS — M5412 Radiculopathy, cervical region: Secondary | ICD-10-CM | POA: Diagnosis not present

## 2020-10-26 MED ORDER — GABAPENTIN 100 MG PO CAPS: 100.0000 mg | ORAL_CAPSULE | Freq: Two times a day (BID) | ORAL | 3 refills | Status: DC

## 2020-10-26 NOTE — Progress Notes (Signed)
Subjective:    Patient ID: Bianca Goodman, female    DOB: March 01, 1961, 60 y.o.   MRN: [id number]  HPI: Bianca Goodman is a 60 y.o. female who returns for follow up appointment for chronic pain and medication refill. She states her pain is located in her neck radiating into her left shoulder and left hand with tingling . [name] states she is being scheduled for MRI, waiting on date. Also stated when she increased her frequency of her Gabapentin 300 mg , she began experiencing losing her balanced. We will change her Gabapentin to 100 mg BID during the day and 300 mg at bedtime. She was instructed to call office in two weeks with an update, she verbalizes understanding.  She  rates her pain 8. Her current exercise regime is walking and performing stretching exercises.     Pain Inventory Average Pain 8 Pain Right Now 8 My pain is constant and sharp  In the last 24 hours, has pain interfered with the following? General activity 8 Relation with others 8 Enjoyment of life 9 What TIME of day is your pain at its worst? daytime Sleep (in general) Poor  Pain is worse with: walking, bending, sitting, inactivity and standing Pain improves with: rest, heat/ice and medication Relief from Meds:  not sure  Family History  Problem Relation Age of Onset   Cancer Mother    Heart disease Father    Heart disease Brother    Social History   Socioeconomic History   Marital status: Single    Spouse name: Not on file   Number of children: Not on file   Years of education: Not on file   Highest education level: Not on file  Occupational History   Not on file  Tobacco Use   Smoking status: Former Smoker    Quit date: 09/20/2019    Years since quitting: 1.1   Smokeless tobacco: Not on file  Vaping Use   Vaping Use: Never used  Substance and Sexual Activity   Alcohol use: Not Currently   Drug use: Never   Sexual activity: Not on file  Other Topics Concern   Not on file  Social History Narrative    Not on file   Social Determinants of Health   Financial Resource Strain: Not on file  Food Insecurity: Not on file  Transportation Needs: Not on file  Physical Activity: Not on file  Stress: Not on file  Social Connections: Not on file   Past Surgical History:  Procedure Laterality Date   APPENDECTOMY     1997   scalenectomy     1985   SPINE SURGERY     Cervical   Past Surgical History:  Procedure Laterality Date   APPENDECTOMY     1997   scalenectomy     1985   SPINE SURGERY     Cervical   Past Medical History:  Diagnosis Date   Endometriosis    Hypertension    Thyroid disease    BP 114/76   Pulse 77   Temp 98 F (36.7 C)   Ht 5\' 4"  (1.626 m)   Wt 163 lb 6.4 oz (74.1 kg)   SpO2 98%   BMI 28.05 kg/m   Opioid Risk Score:   Fall Risk Score:  `1  Depression screen PHQ 2/9  Depression screen PHQ 2/9 09/28/2020  Decreased Interest 1  Down, Depressed, Hopeless 2  PHQ - 2 Score 3  Altered sleeping 0  Tired, decreased energy  1  Change in appetite 1  Feeling bad or failure about yourself  0  Trouble concentrating 0  Moving slowly or fidgety/restless 0  Suicidal thoughts 0  PHQ-9 Score 5  Difficult doing work/chores Very difficult    Review of Systems  Constitutional: Negative.   HENT: Negative.    Eyes: Negative.   Respiratory: Negative.    Cardiovascular: Negative.   Gastrointestinal: Negative.   Endocrine: Negative.   Genitourinary: Negative.   Musculoskeletal:  Positive for back pain and neck pain.       Left shoulder pain , left hand   Skin: Negative.   Allergic/Immunologic: Negative.   Neurological: Negative.   Hematological: Negative.   Psychiatric/Behavioral: Negative.         Objective:   Physical Exam Vitals and nursing note reviewed.  Constitutional:      Appearance: Normal appearance.  Neck:     Comments: Cervical Paraspinal Tenderness: C-5-C-6 Cardiovascular:     Rate and Rhythm: Normal rate and regular rhythm.     Pulses:  Normal pulses.     Heart sounds: Normal heart sounds.  Pulmonary:     Effort: Pulmonary effort is normal.     Breath sounds: Normal breath sounds.  Musculoskeletal:     Cervical back: Normal range of motion and neck supple.     Comments: Normal Muscle Bulk and Muscle Testing Reveals:  Upper Extremities: Right: Full ROM and Muscle Strength  5/5 Left Upper Extremity: Decreased ROM 45 Degrees and Muscle Strength 2/5 Left AC Joint Tenderness Lower Extremities: Full ROM and Muscle Strength 5/5 Right Lower Extremity Flexion Produces Pain into Her Patella Arises from chair with ease Narrow Based  Gait     Skin:    General: Skin is warm and dry.  Neurological:     Mental Status: She is alert and oriented to person, place, and time.  Psychiatric:        Mood and Affect: Mood normal.        Behavior: Behavior normal.         Assessment & Plan:  Cervicalgia/ Cervical Radiculitis: Continue HEP as Tolerated. Continue Gabapentin 100 mg BID during the day and Gabapentin 300 mg at HS. Continue to monitor. [name] reports MRI was ordered she is waiting for scheduled date.  Left Arm Radicular Pain: Continue Gabapentin . Continue to Monitor.  Chronic Pain Syndrome: Continue current medication regimen. Continue to monitor.   F/U in 1 month

## 2020-10-26 NOTE — Patient Instructions (Signed)
Call office on Friday 06/10 or 11/01/2020 for urine drug screen results .   Not able to prescribe until results are in.   Call [place] or Dr Wynn Banker at [phone]

## 2020-10-28 ENCOUNTER — Telehealth: Payer: Self-pay | Admitting: Orthopedic Surgery

## 2020-10-28 ENCOUNTER — Encounter: Payer: Self-pay | Admitting: Family Medicine

## 2020-10-28 ENCOUNTER — Ambulatory Visit (INDEPENDENT_AMBULATORY_CARE_PROVIDER_SITE_OTHER): Payer: 59 | Admitting: Family Medicine

## 2020-10-28 ENCOUNTER — Other Ambulatory Visit: Payer: Self-pay

## 2020-10-28 VITALS — BP 112/72 | HR 80 | Temp 97.6°F | Ht 65.5 in | Wt 161.8 lb

## 2020-10-28 DIAGNOSIS — M792 Neuralgia and neuritis, unspecified: Secondary | ICD-10-CM

## 2020-10-28 DIAGNOSIS — E039 Hypothyroidism, unspecified: Secondary | ICD-10-CM | POA: Diagnosis not present

## 2020-10-28 DIAGNOSIS — I10 Essential (primary) hypertension: Secondary | ICD-10-CM | POA: Diagnosis not present

## 2020-10-28 DIAGNOSIS — Z131 Encounter for screening for diabetes mellitus: Secondary | ICD-10-CM

## 2020-10-28 DIAGNOSIS — Z1211 Encounter for screening for malignant neoplasm of colon: Secondary | ICD-10-CM

## 2020-10-28 DIAGNOSIS — Z1231 Encounter for screening mammogram for malignant neoplasm of breast: Secondary | ICD-10-CM

## 2020-10-28 DIAGNOSIS — Z23 Encounter for immunization: Secondary | ICD-10-CM

## 2020-10-28 DIAGNOSIS — Z8742 Personal history of other diseases of the female genital tract: Secondary | ICD-10-CM | POA: Insufficient documentation

## 2020-10-28 DIAGNOSIS — Z13220 Encounter for screening for lipoid disorders: Secondary | ICD-10-CM | POA: Diagnosis not present

## 2020-10-28 DIAGNOSIS — Z1159 Encounter for screening for other viral diseases: Secondary | ICD-10-CM

## 2020-10-28 DIAGNOSIS — Z114 Encounter for screening for human immunodeficiency virus [HIV]: Secondary | ICD-10-CM

## 2020-10-28 DIAGNOSIS — R7303 Prediabetes: Secondary | ICD-10-CM | POA: Insufficient documentation

## 2020-10-28 LAB — LIPID PANEL
Cholesterol: 151 mg/dL (ref 0–200)
HDL: 40.4 mg/dL (ref >39.00)
LDL Cholesterol: 87 mg/dL (ref 0–99)
NonHDL: 111.05
Total CHOL/HDL Ratio: 4
Triglycerides: 119 mg/dL (ref 0.0–149.0)
VLDL: 23.8 mg/dL (ref 0.0–40.0)

## 2020-10-28 LAB — TSH: TSH: 0.01 u[IU]/mL — ABNORMAL LOW (ref 0.35–4.50)

## 2020-10-28 LAB — HEMOGLOBIN A1C: Hgb A1c MFr Bld: 6.5 % (ref 4.6–6.5)

## 2020-10-28 MED ORDER — GABAPENTIN 300 MG PO CAPS: 300.0000 mg | ORAL_CAPSULE | Freq: Every day | ORAL | 1 refills | Status: DC

## 2020-10-28 MED ORDER — HYDROCHLOROTHIAZIDE 12.5 MG PO CAPS: 12.5000 mg | ORAL_CAPSULE | Freq: Every day | ORAL | 3 refills | Status: DC

## 2020-10-28 NOTE — Progress Notes (Signed)
HbA1c suggests possible diabetes. I recommend we repeat this at the next visit, along with a fasting blood glucose.  Lipids are excellent.  TSH is low. We will discuss with the patient about decreasing her levothyroxine dose at her next visit. This could be exacerbating her hand tremor.

## 2020-10-28 NOTE — Progress Notes (Signed)
[facility] [address] [place] [address] Dept: [phone] Dept Fax: [phone]  New Patient Office Visit  Subjective:    Patient ID: Bianca Goodman, female    DOB: Jul 28, 1960, 60 y.o..   MRN: [id number]  Chief Complaint  Patient presents with   Establish Care    NP- establish care. C/o having tremors/pain/swelling in LT hand and difficulty moving it as well.  HX of spinal surgery and scheduled for MRI. She was needed a urine check to make sure a medication is safe for her to take.      History of Present Illness:  Patient is in today to establish care. Ms. Lisbon is originally from the [place]. She has lived in the [place] since 1990. She is a widow and has a 58 yo son who she lives with. They are currently residing in her sister's home, but are looking to get into their own place. Apparently, she has an issue with talking in her sleep that bothers others. She has a past history of tobacco use, but none currently. She denies alcohol or drug use.  Ms. Wooley has a history of chronic pain in her left upper arm. She has a history of thoracic outlet syndrome and underwent a first rib resection as well as anterior scalene resection in 1985. She has numbness of her left little finger since that time and a resting tremor of her left arm. 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 She has been treated fdor chronic pain in the past with Vicodin, tramadol, and gabapentin. In the past two months, she has been seen by Dr. August Saucer (orthopedics) and Dr. Wynn Banker (physical medicine). Dr. Alfonso Patten plans an MRI scan. Dr. Wynn Banker has started her back on gabapentin. She is taking 100 mg in the morning and 300 mg at bedtime. She is being evaluated for possible resumption of tramadol. Dr. Wynn Banker is considering whether her symptoms may represent a complex regional pain syndrome.  Ms. Dreibelbis has a history of  hypertension. She is managed on HCTZ, losartan, and verapamil.  Ms. Haworth has a history of hypothyroidism and is on levothyroxine.  Past Medical History: Patient Active Problem List   Diagnosis Date Noted   Essential hypertension 10/28/2020   Hypothyroidism 10/28/2020   History of endometriosis 10/28/2020   Radicular pain in left arm 09/14/2020   Past Surgical History:  Procedure Laterality Date   APPENDECTOMY     1997   CERVICAL SPINE SURGERY     C4-5 spinal fusion   RIGHT OOPHORECTOMY     scalenectomy     1985   Family History  Problem Relation Age of Onset   Cancer Mother    Heart disease Father    Heart disease Brother    Outpatient Medications Prior to Visit  Medication Sig Dispense Refill   gabapentin (NEURONTIN) 100 MG capsule Take 1 capsule (100 mg total) by mouth 2 (two) times daily. 60 capsule 3   HYDROcodone-acetaminophen (NORCO/VICODIN) 5-325 MG tablet Take 1 tablet by mouth every 12 (twelve) hours as needed for moderate pain. 20 tablet 0   levothyroxine (SYNTHROID) 112 MCG tablet Take 112 mcg by mouth daily before breakfast.     losartan (COZAAR) 50 MG tablet Take 50 mg by mouth daily.     verapamil (CALAN) 120 MG tablet Take 120 mg by mouth 2 (two) times daily.     hydrochlorothiazide (MICROZIDE) 12.5 MG capsule Take 12.5 mg by mouth  daily.     gabapentin (NEURONTIN) 300 MG capsule Take 1 capsule (300 mg total) by mouth 2 (two) times daily. (Patient not taking: Reported on 10/28/2020) 60 capsule 1   No facility-administered medications prior to visit.   Allergies  Allergen Reactions   Cortizone-10 [Hydrocortisone] Rash   Objective:   Today's Vitals   10/28/20 1029  BP: 112/72  Pulse: 80  Temp: 97.6 F (36.4 C)  TempSrc: Temporal  SpO2: 99%  Weight: 161 lb 12.8 oz (73.4 kg)  Height: 5' 5.5" (1.664 m)   Body mass index is 26.52 kg/m.   General: Well developed, well nourished. No acute distress. Neuro: Resting tremor of left hand. Psych: Alert  and oriented x3. Normal mood and affect.  Health Maintenance Due  Topic Date Due   Pneumococcal Vaccine 79-30 Years old (1 - PCV) Never done   Hepatitis C Screening  Never done   TETANUS/TDAP  Never done   Zoster Vaccines- Shingrix (1 of 2) Never done   PAP SMEAR-Modifier  Never done   COLONOSCOPY (Pts 45-52yrs Insurance coverage will need to be confirmed)  Never done   MAMMOGRAM  Never done   COVID-19 Vaccine (3 - Moderna risk series) 06/15/2020     Assessment & Plan:   1. Essential hypertension Blood pressure is at goal today. I will continue her on verapamil, losartan, and HCTZ. She will return in the next month for her pap smear.  - hydrochlorothiazide (MICROZIDE) 12.5 MG capsule; Take 1 capsule (12.5 mg total) by mouth daily.  Dispense: 90 capsule; Refill: 3  2. Acquired hypothyroidism We will assess TSH to determine adequacy of thyroid replacement.  - TSH  3. Radicular pain in left arm I will have my MA check with Dr. Diamantina [place] office on the status of scheduling Mr. Ribaudo for an MRI scan. She will continue on gabapentin and follow-up with Dr. Alfonso Patten and Dr. Wynn Banker.  4. Screening for diabetes mellitus (DM)  - Hemoglobin A1c  5. Screening for lipid disorders  - Lipid panel  6. Encounter for hepatitis C screening test for low risk patient  - HCV Ab w Reflex to Quant PCR  7. Screening for HIV (human immunodeficiency virus)  - HIV Antibody (routine testing w rflx)  8. Screening for colon cancer  - Ambulatory referral to Gastroenterology  9. Encounter for screening mammogram for malignant neoplasm of breast  - MM DIGITAL SCREENING BILATERAL; Future    [name], MD

## 2020-10-28 NOTE — Telephone Encounter (Signed)
Pt primary care doctor called and states pt has been waiting for MRI. She would like to know if you guys could call and get her scheduled because [facility] called and states they never got an order?   CB [phone]

## 2020-10-29 LAB — HCV AB W REFLEX TO QUANT PCR: HCV Ab: <0.1 s/co ratio (ref 0.0–0.9)

## 2020-10-29 LAB — HCV INTERPRETATION

## 2020-10-29 LAB — HIV ANTIBODY (ROUTINE TESTING W REFLEX): HIV 1&2 Ab, 4th Generation: NONREACTIVE

## 2020-10-29 NOTE — Telephone Encounter (Signed)
Looks like the interpreter was supposed to call imaging back per these  notes:  5/17: Called pt w/ interpreter @1852  and [facility] saying we will call back later w/ interpreter again. RG  [facility]  5/4: Pt called and MH talked to pt. Pt states she got an 7/4 in the mail. Waiting to hear from office. Message sent. RG

## 2020-10-29 NOTE — Telephone Encounter (Signed)
What's the status of this?

## 2020-11-01 ENCOUNTER — Encounter: Payer: Self-pay | Admitting: Registered Nurse

## 2020-11-01 ENCOUNTER — Telehealth: Payer: Self-pay

## 2020-11-01 NOTE — Telephone Encounter (Signed)
Yes

## 2020-11-01 NOTE — Telephone Encounter (Signed)
Patient calling stating that she is waiting to get her medication refilled. Took urine sample last week. I explained to pt that results of UDS not back yet and once they came in [place] will look and if its appropriate she will send in prescription.

## 2020-11-01 NOTE — Telephone Encounter (Signed)
Should they call [facility] to schedule MRI?

## 2020-11-02 ENCOUNTER — Telehealth: Payer: Self-pay

## 2020-11-02 ENCOUNTER — Other Ambulatory Visit: Payer: Self-pay

## 2020-11-02 NOTE — Telephone Encounter (Signed)
Bianca Goodman stated that NP Jacalyn Lefevre told her to call the office in a few days for we can fill her medication . She didn't specify which medication . She just stated she would like to speak to Bianca Goodman .

## 2020-11-02 NOTE — Telephone Encounter (Signed)
She would like MRI OPEN- [phone]

## 2020-11-02 NOTE — Telephone Encounter (Signed)
Per patient ins will approve [facility] to have open MRI. Bianca Goodman will send info.order to them. They should call patient in spanish to schedule appt. Patient aware.

## 2020-11-03 ENCOUNTER — Other Ambulatory Visit (HOSPITAL_COMMUNITY)
Admission: RE | Admit: 2020-11-03 | Discharge: 2020-11-03 | Disposition: A | Discharge status: Home / Self Care | Payer: 59 | Source: Ambulatory Visit | Attending: Diagnostic Radiology | Admitting: Diagnostic Radiology

## 2020-11-03 ENCOUNTER — Ambulatory Visit (INDEPENDENT_AMBULATORY_CARE_PROVIDER_SITE_OTHER): Payer: 59 | Admitting: Family Medicine

## 2020-11-03 ENCOUNTER — Encounter: Payer: Self-pay | Admitting: Family Medicine

## 2020-11-03 VITALS — BP 118/74 | HR 89 | Temp 97.8°F | Ht 65.5 in | Wt 162.0 lb

## 2020-11-03 DIAGNOSIS — E039 Hypothyroidism, unspecified: Secondary | ICD-10-CM

## 2020-11-03 DIAGNOSIS — I10 Essential (primary) hypertension: Secondary | ICD-10-CM

## 2020-11-03 DIAGNOSIS — R739 Hyperglycemia, unspecified: Secondary | ICD-10-CM

## 2020-11-03 DIAGNOSIS — Z124 Encounter for screening for malignant neoplasm of cervix: Secondary | ICD-10-CM | POA: Insufficient documentation

## 2020-11-03 LAB — TOXASSURE SELECT,+ANTIDEPR,UR

## 2020-11-03 MED ORDER — TRAMADOL HCL 50 MG PO TABS: 50.0000 mg | ORAL_TABLET | Freq: Two times a day (BID) | ORAL | 0 refills | Status: DC | PRN

## 2020-11-03 MED ORDER — LEVOTHYROXINE SODIUM 100 MCG PO TABS: 100.0000 ug | ORAL_TABLET | Freq: Every day | ORAL | 3 refills | Status: DC

## 2020-11-03 NOTE — Telephone Encounter (Signed)
Urine drug screen for this encounter is consistent for  having no medication present. This is expected.

## 2020-11-03 NOTE — Telephone Encounter (Signed)
Return [name] call, Dr Bianca Goodman note was reviewed and UDS results consistent. Tramadol 50 mg BID as needed for pain will be e-scribed tody, she verbalizes understanding.  [name] was instructed to call or send a My-Chart message with a update in two weeks, she verbalizes understanding.

## 2020-11-03 NOTE — Telephone Encounter (Signed)
Patient is in our office today for appt with Dr Veto Kemps and is asking about the Hydrocodone RX and when it will be sent to the pharmacy for her. Can someone please reach out to her? Thanks. Dm/cma

## 2020-11-03 NOTE — Progress Notes (Signed)
[facility] [address] [place] [address] Dept: [phone] Dept Fax: [phone]  Office Visit  Subjective:    Patient ID: Bianca Goodman, female    DOB: 1961-04-21, 60 y.o..   MRN: [id number]  Chief Complaint  Patient presents with   Follow-up    F/u for pap and to review lab results.   History of Present Illness:  Patient is in today for to complete her pap smear and to review the results form her recent lab tests.   Interpretative services were provided by Bianca Goodman [phone])  [name] continues to have radicular pain int h left arm with an associated tremor. She continues to follow with her pain specialists. Recently, she was switched from hydrocodone to tramadol, but has not yet picked up the new medication.  Past Medical History: Patient Active Problem List   Diagnosis Date Noted   Essential hypertension 10/28/2020   Hypothyroidism 10/28/2020   History of endometriosis 10/28/2020   Hyperglycemia 10/28/2020   Radicular pain in left arm 09/14/2020   Past Surgical History:  Procedure Laterality Date   APPENDECTOMY     1997   CERVICAL SPINE SURGERY     C4-5 spinal fusion   RIGHT OOPHORECTOMY     scalenectomy     1985   Family History  Problem Relation Age of Onset   Cancer Mother    Heart disease Father    Heart disease Brother    Outpatient Medications Prior to Visit  Medication Sig Dispense Refill   gabapentin (NEURONTIN) 100 MG capsule Take 1 capsule (100 mg total) by mouth 2 (two) times daily. 60 capsule 3   gabapentin (NEURONTIN) 300 MG capsule Take 1 capsule (300 mg total) by mouth at bedtime. 60 capsule 1   hydrochlorothiazide (MICROZIDE) 12.5 MG capsule Take 1 capsule (12.5 mg total) by mouth daily. 90 capsule 3   HYDROcodone-acetaminophen (NORCO/VICODIN) 5-325 MG tablet Take 1 tablet by mouth every 12 (twelve) hours as needed for moderate pain. 20 tablet 0   losartan (COZAAR) 50 MG tablet Take  50 mg by mouth daily.     traMADol (ULTRAM) 50 MG tablet Take 1 tablet (50 mg total) by mouth 2 (two) times daily as needed. 60 tablet 0   verapamil (CALAN) 120 MG tablet Take 120 mg by mouth 2 (two) times daily.     levothyroxine (SYNTHROID) 112 MCG tablet Take 112 mcg by mouth daily before breakfast.     No facility-administered medications prior to visit.   Allergies  Allergen Reactions   Cortizone-10 [Hydrocortisone] Rash   Objective:   Today's Vitals   11/03/20 1334  BP: 118/74  Pulse: 89  Temp: 97.8 F (36.6 C)  TempSrc: Temporal  SpO2: 98%  Weight: 162 lb (73.5 kg)  Height: 5' 5.5" (1.664 m)   Body mass index is 26.55 kg/m.   General: Well developed, well nourished. No acute distress. GU: Normal eternal genitalia. Vaginal mucosa is pink. The cervix is pink, though mildly friable. There is no discharge in the   vagina or at the cervical os. Uterus is of normal size. It is unclear if she may have some pelvic tenderness, as the patient    was experiencing pain from lying on the table. There were no adnexal masses. Psych: Alert and oriented. Normal mood and affect.  Health Maintenance Due  Topic Date Due   Pneumococcal Vaccine 58-21 Years old (1 - PCV) Never done   Zoster Vaccines- Shingrix (1 of 2)  Never done   PAP SMEAR-Modifier  Never done   COLONOSCOPY (Pts 45-38yrs Insurance coverage will need to be confirmed)  Never done   MAMMOGRAM  Never done   COVID-19 Vaccine (3 - Moderna risk series) 06/15/2020   Lab Results Lab Results  Component Value Date   HGBA1C 6.5 10/28/2020   Lab Results  Component Value Date   CHOL 151 10/28/2020   HDL 40.40 10/28/2020   LDLCALC 87 10/28/2020   TRIG 119.0 10/28/2020   CHOLHDL 4 10/28/2020   Lab Results  Component Value Date   TSH 0.01 (L) 10/28/2020     Assessment & Plan:   1. Hyperglycemia [name]'s HbA1c is elevated. She has a family history of diabetes. I recommend we have her return in 1 month for a repeat  HbA1c and fasting glucose to determine is she has Type 2 diabetes.  2. Acquired hypothyroidism [name] has a history of hypothyroidism. It appears she is on too high of a dose of Synthroid. I will reduce her daily dose and plan to reassess her TSH in 3 months.  - levothyroxine (SYNTHROID) 100 MCG tablet; Take 1 tablet (100 mcg total) by mouth daily.  Dispense: 90 tablet; Refill: 3  3. Essential hypertension Blood pressure is at goal today. She will continue losartan and HCTZ.  4. Screening for cervical cancer  - Cytology - PAP  [name], MD

## 2020-11-09 ENCOUNTER — Telehealth: Payer: Self-pay

## 2020-11-09 ENCOUNTER — Encounter: Payer: Self-pay | Admitting: Family Medicine

## 2020-11-09 DIAGNOSIS — R87610 Atypical squamous cells of undetermined significance on cytologic smear of cervix (ASC-US): Secondary | ICD-10-CM | POA: Insufficient documentation

## 2020-11-09 LAB — CYTOLOGY - PAP
Comment: NEGATIVE
Diagnosis: UNDETERMINED — AB
High risk HPV: NEGATIVE

## 2020-11-09 NOTE — Telephone Encounter (Signed)
Bianca Goodman is calling regarding the medication we prescribed I believe shes talking about the tramadol she said its making her nauseous and would like to speak to the provider about her concern.

## 2020-11-10 ENCOUNTER — Encounter: Payer: Self-pay | Admitting: *Deleted

## 2020-11-10 MED ORDER — HYDROCODONE-ACETAMINOPHEN 5-325 MG PO TABS: 1.0000 | ORAL_TABLET | Freq: Two times a day (BID) | ORAL | 0 refills | Status: DC | PRN

## 2020-11-10 NOTE — Telephone Encounter (Signed)
Return Bianca Goodman call, she reports everytime she was taking herTramadol she was nauseous. She made sure she was eating prior to taking the medication. She has experience the same side effect years ago. Tramadol discontinued and Hydrocodone e-scribed today. She verbalizes understanding.

## 2020-11-13 ENCOUNTER — Other Ambulatory Visit: Payer: Self-pay

## 2020-11-13 ENCOUNTER — Ambulatory Visit (INDEPENDENT_AMBULATORY_CARE_PROVIDER_SITE_OTHER): Payer: 59

## 2020-11-13 DIAGNOSIS — M5021 Other cervical disc displacement,  high cervical region: Secondary | ICD-10-CM

## 2020-11-13 DIAGNOSIS — M792 Neuralgia and neuritis, unspecified: Secondary | ICD-10-CM

## 2020-11-13 DIAGNOSIS — M50322 Other cervical disc degeneration at C5-C6 level: Secondary | ICD-10-CM | POA: Diagnosis not present

## 2020-11-13 DIAGNOSIS — M4802 Spinal stenosis, cervical region: Secondary | ICD-10-CM

## 2020-11-15 NOTE — Progress Notes (Signed)
This yours ?

## 2020-11-23 ENCOUNTER — Encounter: Payer: 59 | Attending: Registered Nurse | Admitting: Registered Nurse

## 2020-11-23 ENCOUNTER — Encounter: Payer: Self-pay | Admitting: Registered Nurse

## 2020-11-23 ENCOUNTER — Telehealth: Payer: Self-pay

## 2020-11-23 ENCOUNTER — Other Ambulatory Visit: Payer: Self-pay

## 2020-11-23 VITALS — BP 109/77 | HR 93 | Temp 98.8°F | Ht 65.0 in | Wt 161.8 lb

## 2020-11-23 DIAGNOSIS — M545 Low back pain, unspecified: Secondary | ICD-10-CM | POA: Insufficient documentation

## 2020-11-23 DIAGNOSIS — R2232 Localized swelling, mass and lump, left upper limb: Secondary | ICD-10-CM | POA: Insufficient documentation

## 2020-11-23 DIAGNOSIS — G8929 Other chronic pain: Secondary | ICD-10-CM | POA: Diagnosis present

## 2020-11-23 DIAGNOSIS — M5412 Radiculopathy, cervical region: Secondary | ICD-10-CM | POA: Diagnosis present

## 2020-11-23 DIAGNOSIS — M792 Neuralgia and neuritis, unspecified: Secondary | ICD-10-CM | POA: Diagnosis present

## 2020-11-23 DIAGNOSIS — M79642 Pain in left hand: Secondary | ICD-10-CM | POA: Diagnosis present

## 2020-11-23 DIAGNOSIS — R251 Tremor, unspecified: Secondary | ICD-10-CM | POA: Diagnosis present

## 2020-11-23 DIAGNOSIS — G894 Chronic pain syndrome: Secondary | ICD-10-CM | POA: Insufficient documentation

## 2020-11-23 DIAGNOSIS — Z5181 Encounter for therapeutic drug level monitoring: Secondary | ICD-10-CM | POA: Diagnosis present

## 2020-11-23 DIAGNOSIS — M542 Cervicalgia: Secondary | ICD-10-CM | POA: Insufficient documentation

## 2020-11-23 MED ORDER — HYDROCODONE-ACETAMINOPHEN 5-325 MG PO TABS: 1.0000 | ORAL_TABLET | Freq: Two times a day (BID) | ORAL | 0 refills | Status: DC | PRN

## 2020-11-23 NOTE — Telephone Encounter (Signed)
Patient would like her MRI results. She is having increased pain. There was not an opening until the 21st of July. Would Dr.Dean be willing to call patient to discuss? I have also scheduled her for the 21st just in case. Call back is #[phone]  Thanks!

## 2020-11-23 NOTE — Patient Instructions (Addendum)
Dr August Saucer Office was called this Morning for Ms. Marszalek regarding her MRI:   Ms. Blackwell has a scheduled appointment with Dr August Saucer on 12/09/2020 at 09:15 am   She needs to be there at 9: 00 am   Team Member Role and Specialty Contact Info Address Start End Comments  August Saucer, Corrie Mckusick, MD Consulting Physician (Orthopedic Surgery) Phone: [phone] Fax: [phone]  [address] [place] [address] 10/28/2020 - -    [facility]: For  X-ray   [address] [place],  [place]  [address] Get Driving Directions Main: [phone]

## 2020-11-23 NOTE — Progress Notes (Signed)
Subjective:    Patient ID: Bianca Goodman, female    DOB: 21-Nov-1960, 59 y.o.   MRN: [id number]  HPI: Bianca Goodman is a 60 y.o. female who returns for follow up appointment for chronic pain and medication refill. She states her  pain is located in her neck radiating into left shoulder, left arm and left hand with tingling and numbness. Also reports left hand tremor and left hand pain and  mid-back pain. Bianca Goodman reports her pain has increased in intensity and she had a MRI on 11/13/2020, Dr August Saucer ordered the MRI. This provider called Dr August Saucer office, the representative will see if Dr August Saucer can call Bianca Goodman on tomorrow and she was given a F/U appointment for 12/09/2020, she verbalizes understanding.  Left hand X-ray ordered, Bianca Goodman denies falling and referral placed to neurology regarding left hand tremor. Bianca Goodman also states she had tremor prior to being prescribe gabapentin she has noticed an increase in frequency.   Translator was in room and all questions were answered and Bianca Goodman verbalizes understanding.   She rates her pain 8. Her current exercise regime is walking and performing stretching exercises.    Pain Inventory Average Pain 8 Pain Right Now 8 My pain is sharp and burning  In the last 24 hours, has pain interfered with the following? General activity 8 Relation with others 9 Enjoyment of life 8 What TIME of day is your pain at its worst? daytime Sleep (in general) Fair  Pain is worse with: walking, bending, and some activites Pain improves with: rest, heat/ice, and medication Relief from Meds: 4  Family History  Problem Relation Age of Onset   Cancer Mother    Heart disease Father    Heart disease Brother    Social History   Socioeconomic History   Marital status: Widowed    Spouse name: Not on file   Number of children: 1   Years of education: Not on file   Highest education level: Not on file  Occupational History   Not on file  Tobacco Use   Smoking  status: Former    Pack years: 0.00    Types: Cigarettes    Quit date: 09/20/2019    Years since quitting: 1.1   Smokeless tobacco: Never  Vaping Use   Vaping Use: Never used  Substance and Sexual Activity   Alcohol use: Not Currently   Drug use: Never   Sexual activity: Yes  Other Topics Concern   Not on file  Social History Narrative   Not on file   Social Determinants of Health   Financial Resource Strain: Not on file  Food Insecurity: Not on file  Transportation Needs: Not on file  Physical Activity: Not on file  Stress: Not on file  Social Connections: Not on file   Past Surgical History:  Procedure Laterality Date   APPENDECTOMY     1997   CERVICAL SPINE SURGERY     C4-5 spinal fusion   RIGHT OOPHORECTOMY     scalenectomy     1985   Past Surgical History:  Procedure Laterality Date   APPENDECTOMY     1997   CERVICAL SPINE SURGERY     C4-5 spinal fusion   RIGHT OOPHORECTOMY     scalenectomy     1985   Past Medical History:  Diagnosis Date   Endometriosis    Hypertension    Thyroid disease    BP 109/77 (BP Location: Right Arm, Patient Position:  Sitting, Cuff Size: Large)   Pulse 93   Temp 98.8 F (37.1 C) (Oral)   Ht 5\' 5"  (1.651 m)   Wt 161 lb 12.8 oz (73.4 kg)   SpO2 97%   BMI 26.92 kg/m   Opioid Risk Score:   Fall Risk Score:  `1  Depression screen PHQ 2/9  Depression screen [facility] 2/9 10/26/2020 09/28/2020  Decreased Interest 1 1  Down, Depressed, Hopeless 1 2  PHQ - 2 Score 2 3  Altered sleeping 1 0  Tired, decreased energy - 1  Change in appetite - 1  Feeling bad or failure about yourself  - 0  Trouble concentrating - 0  Moving slowly or fidgety/restless - 0  Suicidal thoughts - 0  PHQ-9 Score 3 5  Difficult doing work/chores - Very difficult     Review of Systems  Constitutional: Negative.   HENT: Negative.    Eyes: Negative.   Respiratory: Negative.    Cardiovascular: Negative.   Gastrointestinal: Negative.   Endocrine:  Negative.   Genitourinary: Negative.   Musculoskeletal:  Positive for back pain and gait problem.       Left shoulder pain , pain in left arm   Skin: Negative.   Allergic/Immunologic: Negative.   Psychiatric/Behavioral: Negative.        Objective:   Physical Exam Vitals and nursing note reviewed.  Constitutional:      Appearance: Normal appearance.  Cardiovascular:     Rate and Rhythm: Normal rate and regular rhythm.     Pulses: Normal pulses.     Heart sounds: Normal heart sounds.  Pulmonary:     Effort: Pulmonary effort is normal.     Breath sounds: Normal breath sounds.  Musculoskeletal:     Cervical back: Normal range of motion and neck supple.     Comments: Normal Muscle Bulk and Muscle Testing Reveals:  Upper Extremities: Right: Full ROM and Muscle Strength  5/5 Left Upper Extremity: Decreased ROM 45 Degrees and Muscle Strength 3/5 Left AC Joint Tenderness Thoracic Hypersensitivity: T-1-T-7 Mainly Left Side Left Greater Trochanter Tenderness  Lower Extremities: Full ROM and Muscle Strength 5/5 Arises from chair with ease Narrow Based  Gait     Skin:    General: Skin is warm and dry.  Neurological:     Mental Status: She is alert and oriented to person, place, and time.  Psychiatric:        Mood and Affect: Mood normal.        Behavior: Behavior normal.         Assessment & Plan:  Cervicalgia/ Cervical Radiculitis: Continue HEP as Tolerated. Continue Gabapentin 100 mg BID during the day and Gabapentin 300 mg at HS. Continue to monitor. Bianca Goodman has a scheduled appointment with Dr Carlynn Purl on 12/09/2020.  Left Arm Radicular Pain: Continue Gabapentin . Continue to Monitor.12/09/2020 Chronic Pain Syndrome: Continue current medication regimen. Continue to monitor. 12/09/2020  4. Left Hand Pain: Left Hand Swelling:  Denies Falling: RX: Left Hand X-ray 5. Left Hand Tremor: RX: Neurology Referral  6. Chronic Thoracic Pain: Continue current medication regimen. Continue to  monitor.   F/U in 1 month

## 2020-11-23 NOTE — Telephone Encounter (Signed)
Please see below.

## 2020-11-24 NOTE — Addendum Note (Signed)
Addended by: Prescott Parma on: 11/24/2020 11:03 AM   Modules accepted: Orders

## 2020-11-24 NOTE — Telephone Encounter (Signed)
I called the patient left a message on her phone.  Can you get her set up with an injection with Dr. Alvester Morin for her neck.  Thanks

## 2020-11-25 ENCOUNTER — Telehealth: Payer: Self-pay | Admitting: *Deleted

## 2020-11-25 NOTE — Telephone Encounter (Signed)
Return Bianca Goodman call, no answer. Left message to return the call.

## 2020-11-25 NOTE — Telephone Encounter (Signed)
Mrs Sapp called requesting to speak with Bianca Goodman and would like a call back.

## 2020-11-29 ENCOUNTER — Telehealth: Payer: Self-pay

## 2020-11-29 NOTE — Telephone Encounter (Signed)
Bianca Goodman is calling to speak to [name] about her MRI she stated Dr.Deans office contacted her back . Thank you.

## 2020-11-30 NOTE — Telephone Encounter (Signed)
Return Bianca Goodman call, she wanted to give this provider an update. She states Dr August Saucer called her and reviewed the MRI with her. She has a scheduled appointment with Dr August Saucer on 12/09/2020, to discuss cervical injection, she reports.

## 2020-12-01 ENCOUNTER — Ambulatory Visit: Payer: 59 | Admitting: Family Medicine

## 2020-12-08 ENCOUNTER — Encounter: Payer: Self-pay | Admitting: Physical Medicine and Rehabilitation

## 2020-12-08 ENCOUNTER — Ambulatory Visit (INDEPENDENT_AMBULATORY_CARE_PROVIDER_SITE_OTHER): Payer: 59 | Admitting: Physical Medicine and Rehabilitation

## 2020-12-08 VITALS — BP 121/81 | HR 85

## 2020-12-08 DIAGNOSIS — M79642 Pain in left hand: Secondary | ICD-10-CM | POA: Diagnosis not present

## 2020-12-08 DIAGNOSIS — G894 Chronic pain syndrome: Secondary | ICD-10-CM

## 2020-12-08 DIAGNOSIS — M792 Neuralgia and neuritis, unspecified: Secondary | ICD-10-CM

## 2020-12-08 DIAGNOSIS — M5412 Radiculopathy, cervical region: Secondary | ICD-10-CM | POA: Diagnosis not present

## 2020-12-08 NOTE — Progress Notes (Signed)
Bianca Goodman - 60 y.o. female MRN [id number]  Date of birth: [birth year]  Office Visit Note: Visit Date: 12/08/2020 PCP: [name], MD Referred by: [name], MD  Subjective: Chief Complaint  Patient presents with   Neck - Pain   Left Shoulder - Pain   Left Hand - Pain   Left Arm - Pain   HPI: Bianca Goodman is a 60 y.o. female who comes in today per request of Dr. Cammy Copa for evaluation and management and consideration of interventional spine procedure for severe left-sided neck pain radiating down the arm and into the hand for 6 months.  Patient reports pain has become progressively worse over the last several weeks and rates at 10 out of 10 at present.  Patient reports pain worsens with activity and is relieved by rest.  Patient also reports some relief of pain with medications such as Gabapentin and Hydrocodone at home. Patient states she had physical therapy sessions in [place] many years ago that made her neck pain worse. Patient's recent cervical MRI exhibits moderate left C7 foraminal stenosis. Patient had normal electrodiagnostic study in our office of the left upper limb in May 2022. Patient seems very concerned about her left hand today, more so than her neck pain. Patient is requesting a brace for her left hand and an x-ray. Patient is currently seeing Dr. Claudette Laws for chronic pain management. Patient denies numbness and tingling. Patient denies recent trauma or falls.       Review of Systems  Musculoskeletal:  Positive for myalgias and neck pain.  Neurological:  Negative for tingling.  All other systems reviewed and are negative. Otherwise per HPI.  Assessment & Plan: Visit Diagnoses:    ICD-10-CM   1. Cervical radiculitis  M54.12     2. Radicular pain in left arm  M79.2     3. Left hand pain  M79.642     4. Chronic pain syndrome  G89.4        Plan: Findings:  Chronic, worsening and severe left sided neck pain radiating down arm and  hand, which are somewhat consistent with C5 vs C6 nerve pattern. Although patient continues to have severe left hand pain and tremors.  Clinically the tremors would not be from the cervical spine.  Also clinically the hand pain just does not seem consistent with radicular pain but cannot be ruled out at this point..  The next step is to perform a diagnostic and hopefully therapeutic left C7-T1 interlaminar epidural steroid injection. We offered patient oral sedation prior to procedure such as Valium or Halcion, however she expressed concerns about increased anxiety related to the injection and emphatically verbalized that she would like IV sedation.  If the injection is to proceed I would recommend one of the following physicians: Dr. Sheran Luz at [facility], Dr. Letta Kocher at [facility] and Dr. Steffanie Rainwater at [facility] would all be appropriate options for injection with IV sedation at a surgery center. Patient instructed to continue to follow-up with Dr. Cammy Copa for further management of hand pain.  I would ask Dr. August Saucer if he wants to proceed with that injection to schedule with 1 of those physicians or if he needs [place] to make that referral I would be happy to do that.  Patient encouraged to continue with pain management and conservative therapies at home.    Meds & Orders: No orders of the defined types were placed in this encounter.  No orders of the defined types were placed in this encounter.   Follow-up: Return if symptoms worsen or fail to improve.   Procedures: No procedures performed      Clinical History: EXAM: MRI CERVICAL SPINE WITHOUT CONTRAST   TECHNIQUE: Multiplanar, multisequence MR imaging of the cervical spine was performed. No intravenous contrast was administered.   COMPARISON:  None available.   FINDINGS: Alignment: Dextroscoliosis. Straightening with reversal of the normal cervical lordosis, apex at C3-4. No listhesis.    Vertebrae: Prior ACDF at C4-5 with solid arthrodesis. Vertebral body height maintained without acute or chronic fracture. Bone marrow signal intensity within normal limits. No discrete or worrisome osseous lesions or abnormal marrow edema.   Cord: Normal signal morphology.   Posterior Fossa, vertebral arteries, paraspinal tissues: Probable mild chronic microvascular ischemic disease noted within the pons. Visualized brain and posterior fossa otherwise unremarkable. Craniocervical junction normal. Paraspinous and prevertebral soft tissues within normal limits. Normal flow voids seen within the vertebral arteries bilaterally.   Disc levels:   C2-C3: Negative interspace. Minimal facet hypertrophy. No canal or foraminal stenosis.   C3-C4: Diffuse disc bulge with bilateral uncovertebral hypertrophy. Bulging disc flattens and partially faces the ventral thecal sac with resultant mild spinal stenosis. Minimal flattening of the ventral cord without cord signal changes. Mild uncovertebral hypertrophy without significant foraminal encroachment.   C4-C5:  Prior fusion.  No residual canal or foraminal stenosis.   C5-C6: Degenerative intervertebral disc space narrowing with diffuse disc bulge and bilateral uncovertebral spurring. Flattening of the ventral thecal sac without significant spinal stenosis or cord deformity. Resultant mild to moderate left with mild right C6 foraminal narrowing.   C6-C7: Disc bulge with left greater than right uncinate spurring. No spinal stenosis. Moderate left C7 foraminal narrowing. This is suspected to be the symptomatic level. Right neural foramina remains widely patent.   C7-T1: Negative interspace. Mild left greater than right facet hypertrophy. No canal or foraminal stenosis.   Visualized upper thoracic spine demonstrates no significant finding.   IMPRESSION: 1. Disc bulge with uncovertebral spurring at C6-7 with resultant moderate left C7  foraminal stenosis. This is suspected to be the symptomatic level. 2. Degenerative disc bulge with uncovertebral spurring at C5-6 with resultant mild to moderate left and mild right C6 foraminal stenosis. 3. Broad-based disc bulge at C3-4 with resultant mild spinal stenosis. 4. Prior ACDF at C4-5 without residual or recurrent stenosis.     Electronically Signed   By: Rise Mu M.D.   On: 11/14/2020 19:10   She reports that she quit smoking about 14 months ago. Her smoking use included cigarettes. She has never used smokeless tobacco.  Recent Labs    09/28/20 1106 10/28/20 1134  HGBA1C  --  6.5  LABURIC 3.9  --     Objective:  VS:  HT:    WT:   BMI:     BP:121/81  HR:85bpm  TEMP: ( )  RESP:  Physical Exam HENT:     Head: Normocephalic.     Right Ear: Tympanic membrane normal.     Left Ear: Tympanic membrane normal.     Nose: Nose normal.     Mouth/Throat:     Mouth: Mucous membranes are moist.  Eyes:     Pupils: Pupils are equal, round, and reactive to light.  Cardiovascular:     Rate and Rhythm: Normal rate.  Pulmonary:     Effort: Pulmonary effort is normal.  Abdominal:     General: Abdomen is flat.  There is no distension.  Musculoskeletal:     Cervical back: Tenderness present.     Comments: Discomfort noted with flexion, extension and side-to-side rotation. Strength testing limited by patient effort. Sensation intact bilaterally. Negative Hoffman's sign.    Skin:    General: Skin is warm and dry.     Capillary Refill: Capillary refill takes less than 2 seconds.  Neurological:     General: No focal deficit present.     Mental Status: She is alert.  Psychiatric:        Mood and Affect: Mood normal.    Ortho Exam  Imaging: No results found.  Past Medical/Family/Surgical/Social History: Medications & Allergies reviewed per EMR, new medications updated. Patient Active Problem List   Diagnosis Date Noted   Atypical squamous cell changes of  undetermined significance (ASCUS) on cervical cytology with negative high risk human papilloma virus (HPV) test result 11/09/2020   Essential hypertension 10/28/2020   Hypothyroidism 10/28/2020   History of endometriosis 10/28/2020   Hyperglycemia 10/28/2020   Radicular pain in left arm 09/14/2020   Past Medical History:  Diagnosis Date   Endometriosis    Hypertension    Thyroid disease    Family History  Problem Relation Age of Onset   Cancer Mother    Heart disease Father    Heart disease Brother    Past Surgical History:  Procedure Laterality Date   APPENDECTOMY     1997   CERVICAL SPINE SURGERY     C4-5 spinal fusion   RIGHT OOPHORECTOMY     scalenectomy     1985   Social History   Occupational History   Not on file  Tobacco Use   Smoking status: Former    Types: Cigarettes    Quit date: 09/20/2019    Years since quitting: 1.2   Smokeless tobacco: Never  Vaping Use   Vaping Use: Never used  Substance and Sexual Activity   Alcohol use: Not Currently   Drug use: Never   Sexual activity: Yes

## 2020-12-08 NOTE — Progress Notes (Signed)
Pt state Neck pain that travels to left shoulder down her left arm. Pt state her left hand as pain. Pt state it hard for her to sleep at night. Pt state she would like a brace or support for her hand.Pt state she has an hard time spreading her fingers. Pt state she is allergic to Cortizone. Pt state everything makes the pain worse. Pt state sometime the pain gets worse during the day. Pt state she takes pain meds and it only work when she not moving.  Numeric Pain Rating Scale and Functional Assessment Average Pain 8 Pain Right Now 10 My pain is constant, sharp, burning, stabbing, and aching Pain is worse with: some activites Pain improves with: heat/ice and medication   In the last MONTH (on 0-10 scale) has pain interfered with the following?  1. General activity like being  able to carry out your everyday physical activities such as walking, climbing stairs, carrying groceries, or moving a chair?  Rating(8)  2. Relation with others like being able to carry out your usual social activities and roles such as  activities at home, at work and in your community. Rating(8)  3. Enjoyment of life such that you have  been bothered by emotional problems such as feeling anxious, depressed or irritable?  Rating(8)

## 2020-12-09 ENCOUNTER — Ambulatory Visit: Payer: 59 | Admitting: Orthopedic Surgery

## 2020-12-14 ENCOUNTER — Other Ambulatory Visit: Payer: Self-pay

## 2020-12-14 ENCOUNTER — Encounter: Payer: Self-pay | Admitting: Family Medicine

## 2020-12-14 ENCOUNTER — Ambulatory Visit (INDEPENDENT_AMBULATORY_CARE_PROVIDER_SITE_OTHER): Payer: 59 | Admitting: Family Medicine

## 2020-12-14 VITALS — BP 118/70 | HR 85 | Temp 97.3°F | Ht 65.0 in | Wt 162.2 lb

## 2020-12-14 DIAGNOSIS — I10 Essential (primary) hypertension: Secondary | ICD-10-CM

## 2020-12-14 DIAGNOSIS — R739 Hyperglycemia, unspecified: Secondary | ICD-10-CM

## 2020-12-14 DIAGNOSIS — M792 Neuralgia and neuritis, unspecified: Secondary | ICD-10-CM

## 2020-12-14 DIAGNOSIS — R87610 Atypical squamous cells of undetermined significance on cytologic smear of cervix (ASC-US): Secondary | ICD-10-CM

## 2020-12-14 DIAGNOSIS — E039 Hypothyroidism, unspecified: Secondary | ICD-10-CM

## 2020-12-14 LAB — GLUCOSE, RANDOM: Glucose, Bld: 102 mg/dL — ABNORMAL HIGH (ref 70–99)

## 2020-12-14 LAB — HEMOGLOBIN A1C: Hgb A1c MFr Bld: 6.3 % (ref 4.6–6.5)

## 2020-12-14 MED ORDER — LOSARTAN POTASSIUM 50 MG PO TABS: 50.0000 mg | ORAL_TABLET | Freq: Every day | ORAL | 3 refills | Status: DC

## 2020-12-14 MED ORDER — VERAPAMIL HCL 120 MG PO TABS: 120.0000 mg | ORAL_TABLET | Freq: Two times a day (BID) | ORAL | 3 refills | Status: DC

## 2020-12-14 NOTE — Progress Notes (Signed)
[facility] [address] [place] [address] Dept: [phone] Dept Fax: [phone]  Chronic Care Office Visit  Subjective:    Patient ID: Bianca Goodman, female    DOB: [birth year], 60 y.o..   MRN: [id number]  Chief Complaint  Patient presents with   Follow-up    4 week f/u hyperglycemia.  Needs refills on the losartan and verapamil.     History of Present Illness:  Patient is in today for reassessment of chronic medical issues.  Ms. Strehl continues to work with orthopedics related to her left arm pain. She notes they plan to try a cervical steroid injection for this.  Ms. Bolda is on losartan, verapamil, and HCTZ for hypertension control. She is pleased to see her BP doing well on this.  Ms. Vivian had a recent HbA1c which was mildly elevated. She has a positive family history for DM.   Ms. Yin has a history of hypothyroidism. We decreased her levothyroxine dose at her last visit, due to a low TSH level.  Ms. Wangerin had a recent pap smear. Her result showed ASCUS, HPV negative.  Past Medical History: Patient Active Problem List   Diagnosis Date Noted   Atypical squamous cell changes of undetermined significance (ASCUS) on cervical cytology with negative high risk human papilloma virus (HPV) test result 11/09/2020   Essential hypertension 10/28/2020   Hypothyroidism 10/28/2020   History of endometriosis 10/28/2020   Hyperglycemia 10/28/2020   Radicular pain in left arm 09/14/2020   Past Surgical History:  Procedure Laterality Date   APPENDECTOMY     1997   CERVICAL SPINE SURGERY     C4-5 spinal fusion   RIGHT OOPHORECTOMY     scalenectomy     1985   Family History  Problem Relation Age of Onset   Cancer Mother    Heart disease Father    Heart disease Brother    Outpatient Medications Prior to Visit  Medication Sig Dispense Refill   gabapentin (NEURONTIN) 100 MG capsule Take 1 capsule (100 mg total) by  mouth 2 (two) times daily. 60 capsule 3   gabapentin (NEURONTIN) 300 MG capsule Take 1 capsule (300 mg total) by mouth at bedtime. 60 capsule 1   hydrochlorothiazide (MICROZIDE) 12.5 MG capsule Take 1 capsule (12.5 mg total) by mouth daily. 90 capsule 3   HYDROcodone-acetaminophen (NORCO/VICODIN) 5-325 MG tablet Take 1 tablet by mouth 2 (two) times daily as needed for moderate pain. Do Not Fill Before 12/08/2020 60 tablet 0   levothyroxine (SYNTHROID) 100 MCG tablet Take 1 tablet (100 mcg total) by mouth daily. 90 tablet 3   losartan (COZAAR) 50 MG tablet Take 50 mg by mouth daily.     verapamil (CALAN) 120 MG tablet Take 120 mg by mouth 2 (two) times daily.     No facility-administered medications prior to visit.   Allergies  Allergen Reactions   Cortizone-10 [Hydrocortisone] Rash   Tramadol Nausea Only   Objective:   Today's Vitals   12/14/20 1013  BP: 118/70  Pulse: 85  Temp: (!) 97.3 F (36.3 C)  TempSrc: Temporal  SpO2: 99%  Weight: 162 lb 3.2 oz (73.6 kg)  Height: 5\' 5"  (1.651 m)   Body mass index is 26.99 kg/m.   General: Well developed, well nourished. No acute distress. Psych: Alert and oriented. Normal mood and affect.  Health Maintenance Due  Topic Date Due   Pneumococcal Vaccine 53-44 Years old (1 - PCV) Never done  Zoster Vaccines- Shingrix (1 of 2) Never done   COLONOSCOPY (Pts 45-60yrs Insurance coverage will need to be confirmed)  Never done   MAMMOGRAM  Never done   COVID-19 Vaccine (3 - Moderna risk series) 06/15/2020   Lab Results Lab Results  Component Value Date   HGBA1C 6.5 10/28/2020   Lab Results  Component Value Date   TSH 0.01 (L) 10/28/2020   Lab Results  Component Value Date   CHOL 151 10/28/2020   HDL 40.40 10/28/2020   LDLCALC 87 10/28/2020   TRIG 119.0 10/28/2020   CHOLHDL 4 10/28/2020      Component Value Date/Time   DIAGPAP (A) 11/03/2020 1407    - Atypical squamous cells of undetermined significance (ASC-US)   HPVHIGH  Negative 11/03/2020 1407   ADEQPAP  11/03/2020 1407    Satisfactory for evaluation; transformation zone component PRESENT.      Assessment & Plan:   1. Essential hypertension Blood pressure is at goal. We will continue 3-drug therapy.  - losartan (COZAAR) 50 MG tablet; Take 1 tablet (50 mg total) by mouth daily.  Dispense: 90 tablet; Refill: 3 - verapamil (CALAN) 120 MG tablet; Take 1 tablet (120 mg total) by mouth 2 (two) times daily.  Dispense: 180 tablet; Refill: 3  2. Acquired hypothyroidism Due for repeat TSH in Sept.  3. Hyperglycemia Discussed with Ms. Aguinaga about elevated HbA1c. She is fasting today. We will repeat her HbA1c and glucose today. If this confirms diabetes, I told her to expect me to start metformin. I will then follow-up with her in Sept. on this.  - Glucose, random - Hemoglobin A1c  4. Atypical squamous cell changes of undetermined significance (ASCUS) on cervical cytology with negative high risk human papilloma virus (HPV) test result Discussed pap test results. Recommendation is to repeat her pap in 3 years, in light of the negative HPV.  5. Radicular pain in left arm Continue with Dr. August Saucer and Dr. Wynn Banker. Plan for epidural steroid injection soon. COT being managed by Dr. Wynn Banker.  [name], MD

## 2020-12-16 ENCOUNTER — Telehealth: Payer: Self-pay | Admitting: Family Medicine

## 2020-12-16 NOTE — Telephone Encounter (Signed)
Patient notified VIA phone.  No questions.  Dm/cma ? ?

## 2020-12-16 NOTE — Telephone Encounter (Signed)
Pt returning call for lab results - call back # [phone]

## 2020-12-22 ENCOUNTER — Encounter: Payer: Self-pay | Admitting: Orthopedic Surgery

## 2020-12-22 ENCOUNTER — Ambulatory Visit (INDEPENDENT_AMBULATORY_CARE_PROVIDER_SITE_OTHER): Payer: 59 | Admitting: Orthopedic Surgery

## 2020-12-22 ENCOUNTER — Other Ambulatory Visit: Payer: Self-pay

## 2020-12-22 DIAGNOSIS — M792 Neuralgia and neuritis, unspecified: Secondary | ICD-10-CM | POA: Diagnosis not present

## 2020-12-22 NOTE — Progress Notes (Signed)
Office Visit Note   Patient: Bianca Goodman           Date of Birth: [birth year]           MRN: [id number] Visit Date: 12/22/2020 Requested by: [name], MD [address] [place],  [place] [address] PCP: [name], MD  Subjective: Chief Complaint  Patient presents with   Other    Scan review    HPI: Bianca Goodman is a 60 y.o. female who presents to the office complaining of left arm pain and left hand tremor.  Patient returns to discuss MRI of the cervical spine results.  She complains of no change in her symptoms since previous office visit.  MRI of the cervical spine revealed disc bulge with moderate left C7 foraminal stenosis as well as mild to moderate left and mild right C6 foraminal stenosis.  Also noted was prior ACDF at C4-C5 without residual or recurrent stenosis.  Patient also had EMG/nerve conduction study by Dr. Alvester Morin that was essentially normal.  She was seen by Dr. Alvester Morin but declined scheduling cervical spine ESI with him due to lack of IV sedation as she has had anxiety.              ROS: All systems reviewed are negative as they relate to the chief complaint within the history of present illness.  Patient denies fevers or chills.  Assessment & Plan: Visit Diagnoses:  1. Radicular pain in left arm     Plan: Patient is a 60 year old female who presents complaining of left arm and left hand pain with left hand tremor.  Plan to refer patient for cervical spine ESI to one of the physicians that was recommended by Dr. Alvester Morin.  Provided wrist brace to see if this will help with the tremor per patient's request.  Also plan to refer patient to neurology for evaluation of left hand tremor.  Follow-up as needed if no improvement in left arm symptoms following injection.  Follow-Up Instructions: No follow-ups on file.   Orders:  Orders Placed This Encounter  Procedures   Epidural Steroid Injection - Cervical/Thoracic (Ancillary Performed)   No orders of the  defined types were placed in this encounter.     Procedures: No procedures performed   Clinical Data: No additional findings.  Objective: Vital Signs: There were no vitals taken for this visit.  Physical Exam:  Constitutional: Patient appears well-developed HEENT:  Head: Normocephalic Eyes:EOM are normal Neck: Normal range of motion Cardiovascular: Normal rate Pulmonary/chest: Effort normal Neurologic: Patient is alert Skin: Skin is warm Psychiatric: Patient has normal mood and affect  Ortho Exam: Ortho exam demonstrates 5/5 motor strength of bilateral bicep, pronation/supination, right grip strength, right finger abduction, right tricep.  4/5 motor strength of left tricep, finger abduction, grip strength.  Negative Hoffmann sign.  Pain noted with cervical spine range of motion.  Tremor of the left hand constantly noted.  Slight muscle wasting of the ulnar aspect of the dorsum of the left hand noted.  Specialty Comments:  No specialty comments available.  Imaging: No results found.   PMFS History: Patient Active Problem List   Diagnosis Date Noted   Atypical squamous cell changes of undetermined significance (ASCUS) on cervical cytology with negative high risk human papilloma virus (HPV) Goodman result 11/09/2020   Essential hypertension 10/28/2020   Hypothyroidism 10/28/2020   History of endometriosis 10/28/2020   Prediabetes 10/28/2020   Radicular pain in left arm 09/14/2020   Past Medical History:  Diagnosis Date   Endometriosis    Hypertension    Thyroid disease     Family History  Problem Relation Age of Onset   Cancer Mother    Heart disease Father    Heart disease Brother     Past Surgical History:  Procedure Laterality Date   APPENDECTOMY     1997   CERVICAL SPINE SURGERY     C4-5 spinal fusion   RIGHT OOPHORECTOMY     scalenectomy     1985   Social History   Occupational History   Not on file  Tobacco Use   Smoking status: Former     Types: Cigarettes    Quit date: 09/20/2019    Years since quitting: 1.2   Smokeless tobacco: Never  Vaping Use   Vaping Use: Never used  Substance and Sexual Activity   Alcohol use: Not Currently   Drug use: Never   Sexual activity: Yes

## 2020-12-27 NOTE — Addendum Note (Signed)
Addended by: Doreene Eland on: 12/27/2020 08:51 AM   Modules accepted: Orders

## 2020-12-28 ENCOUNTER — Encounter: Payer: 59 | Attending: Registered Nurse | Admitting: Registered Nurse

## 2020-12-28 ENCOUNTER — Encounter: Payer: Self-pay | Admitting: Registered Nurse

## 2020-12-28 ENCOUNTER — Other Ambulatory Visit: Payer: Self-pay

## 2020-12-28 VITALS — BP 120/83 | HR 94 | Temp 98.5°F | Ht 65.0 in | Wt 159.0 lb

## 2020-12-28 DIAGNOSIS — Z79899 Other long term (current) drug therapy: Secondary | ICD-10-CM | POA: Diagnosis present

## 2020-12-28 DIAGNOSIS — M5412 Radiculopathy, cervical region: Secondary | ICD-10-CM

## 2020-12-28 DIAGNOSIS — G894 Chronic pain syndrome: Secondary | ICD-10-CM

## 2020-12-28 DIAGNOSIS — Z5181 Encounter for therapeutic drug level monitoring: Secondary | ICD-10-CM

## 2020-12-28 DIAGNOSIS — M792 Neuralgia and neuritis, unspecified: Secondary | ICD-10-CM | POA: Diagnosis present

## 2020-12-28 DIAGNOSIS — M25552 Pain in left hip: Secondary | ICD-10-CM

## 2020-12-28 DIAGNOSIS — M542 Cervicalgia: Secondary | ICD-10-CM | POA: Diagnosis present

## 2020-12-28 DIAGNOSIS — M7062 Trochanteric bursitis, left hip: Secondary | ICD-10-CM

## 2020-12-28 MED ORDER — HYDROCODONE-ACETAMINOPHEN 5-325 MG PO TABS: 1.0000 | ORAL_TABLET | Freq: Two times a day (BID) | ORAL | 0 refills | Status: DC | PRN

## 2020-12-28 NOTE — Progress Notes (Signed)
Subjective:    Patient ID: Bianca Goodman, female    DOB: [birth year], 60 y.o.   MRN: [id number]  HPI: Bianca Goodman is a 60 y.o. female who returns for follow up appointment for chronic pain and medication refill. She states her  pain is located in her neck radiating into her left shoulder and left hip pain, she denies falling. Reports increase intensity and frequency left hip pain. She rates her pain 7. Her current exercise regime is walking and performing stretching exercises.  Interpreter In Room, all questions answered.    Ms. Tillis Morphine equivalent is 10.00 MME.   UDS ordered today   Pain Inventory Average Pain 7 Pain Right Now 7 My pain is constant, sharp, and burning  In the last 24 hours, has pain interfered with the following? General activity 8 Relation with others 8 Enjoyment of life 9 What TIME of day is your pain at its worst? daytime Sleep (in general) Fair  Pain is worse with: walking Pain improves with: medication Relief from Meds: 5  Family History  Problem Relation Age of Onset   Cancer Mother    Heart disease Father    Heart disease Brother    Social History   Socioeconomic History   Marital status: Widowed    Spouse name: Not on file   Number of children: 1   Years of education: Not on file   Highest education level: Not on file  Occupational History   Not on file  Tobacco Use   Smoking status: Former    Types: Cigarettes    Quit date: 09/20/2019    Years since quitting: 1.2   Smokeless tobacco: Never  Vaping Use   Vaping Use: Never used  Substance and Sexual Activity   Alcohol use: Not Currently   Drug use: Never   Sexual activity: Yes  Other Topics Concern   Not on file  Social History Narrative   Not on file   Social Determinants of Health   Financial Resource Strain: Not on file  Food Insecurity: Not on file  Transportation Needs: Not on file  Physical Activity: Not on file  Stress: Not on file  Social Connections: Not on file    Past Surgical History:  Procedure Laterality Date   APPENDECTOMY     1997   CERVICAL SPINE SURGERY     C4-5 spinal fusion   RIGHT OOPHORECTOMY     scalenectomy     1985   Past Surgical History:  Procedure Laterality Date   APPENDECTOMY     1997   CERVICAL SPINE SURGERY     C4-5 spinal fusion   RIGHT OOPHORECTOMY     scalenectomy     1985   Past Medical History:  Diagnosis Date   Endometriosis    Hypertension    Thyroid disease    BP 120/83 (BP Location: Right Arm)   Pulse 94   Temp 98.5 F (36.9 C) (Oral)   Ht 5\' 5"  (1.651 m)   Wt 159 lb (72.1 kg)   SpO2 96%   BMI 26.46 kg/m   Opioid Risk Score:   Fall Risk Score:  `1  Depression screen PHQ 2/9  Depression screen [facility] 2/9 10/26/2020 09/28/2020  Decreased Interest 1 1  Down, Depressed, Hopeless 1 2  PHQ - 2 Score 2 3  Altered sleeping 1 0  Tired, decreased energy - 1  Change in appetite - 1  Feeling bad or failure about yourself  - 0  Trouble  concentrating - 0  Moving slowly or fidgety/restless - 0  Suicidal thoughts - 0  PHQ-9 Score 3 5  Difficult doing work/chores - Very difficult     Review of Systems  Constitutional: Negative.   HENT: Negative.    Eyes: Negative.   Respiratory: Negative.    Cardiovascular: Negative.   Gastrointestinal: Negative.   Endocrine: Negative.   Genitourinary: Negative.   Musculoskeletal:        Pain in left shoulder and left arm .   Skin: Negative.   Allergic/Immunologic: Negative.   Neurological: Negative.   Hematological: Negative.   Psychiatric/Behavioral: Negative.        Objective:   Physical Exam Vitals and nursing note reviewed.  Constitutional:      Appearance: Normal appearance.  Neck:     Comments: Cervical Paraspinal Tenderness: C-5-C-6 Cardiovascular:     Rate and Rhythm: Normal rate and regular rhythm.     Pulses: Normal pulses.     Heart sounds: Normal heart sounds.  Pulmonary:     Effort: Pulmonary effort is normal.     Breath sounds:  Normal breath sounds.  Musculoskeletal:     Cervical back: Normal range of motion and neck supple.     Comments: Normal Muscle Bulk and Muscle Testing Reveals:  Upper Extremities: Full ROM and Muscle Strength 5/5 Wearing Left Wrist Splint Left AC Joint Tenderness Thoracic Paraspinal Tenderness: T-1-T-3 Mainly Left Side Left Greater Trochanter Tenderness Lower Extremities: Full ROM and Muscle Strength 5/5 Arises from Table with ease Narrow Based  Gait     Skin:    General: Skin is warm and dry.  Neurological:     Mental Status: She is alert and oriented to person, place, and time.  Psychiatric:        Mood and Affect: Mood normal.        Behavior: Behavior normal.         Assessment & Plan:  Cervicalgia/ Cervical Radiculitis: Continue HEP as Tolerated. Continue Gabapentin 100 mg BID during the day and Gabapentin 300 mg at HS. Continue to monitor. Ms. Woodham has a scheduled appointment with Dr August Saucer on 12/28/2020.  Left Arm Radicular Pain: Continue Gabapentin . Continue to Monitor.12/28/2020 Chronic Pain Syndrome: Continue current medication regimen. Continue to monitor. 12/28/2020 4. Left Hand Pain: Ortho Following. Continue to monitor.  5. Chronic Thoracic Pain: No complaints today. Continue current medication regimen. Continue to monitor.  6. Left Hip Pain/Left Greater Trochanter Bursitis: RX: X-ray . Continue alternate Ice and Heat Therapy . Continue t monitor.   F/U in 1 month

## 2020-12-30 LAB — TOXASSURE SELECT,+ANTIDEPR,UR

## 2021-01-03 ENCOUNTER — Telehealth: Payer: Self-pay | Admitting: *Deleted

## 2021-01-03 NOTE — Telephone Encounter (Signed)
Urine drug screen for this encounter is consistent for prescribed medication (metabolites).

## 2021-01-05 ENCOUNTER — Telehealth: Payer: Self-pay

## 2021-01-05 NOTE — Telephone Encounter (Signed)
Pt called stated transportation has not contacted her back she stated you told her to call you if she hasn't got a response pt aware your out of office and will be back Monday .

## 2021-01-10 ENCOUNTER — Telehealth: Payer: Self-pay

## 2021-01-10 NOTE — Telephone Encounter (Signed)
   Bianca Goodman DOB: Mar 31, 1961 MRN: [id number]   RIDER WAIVER AND RELEASE OF LIABILITY  For purposes of improving physical access to our facilities, [facility] is pleased to partner with third parties to provide [facility] patients or other authorized individuals the option of convenient, on-demand ground transportation services (the [employer]") through use of the technology service that enables users to request on-demand ground transportation from independent third-party providers.  By opting to use and accept these [employer], I, the undersigned, hereby agree on behalf of myself, and on behalf of any minor child using the Science writer for whom I am the parent or legal guardian, as follows:  Science writer provided to me are provided by independent third-party transportation providers who are not [employer] or employees and who are unaffiliated with [employer]. [facility] is neither a transportation carrier nor a common or public carrier. [facility] has no control over the quality or safety of the transportation that occurs as a result of the [employer]. [facility] cannot guarantee that any third-party transportation provider will complete any arranged transportation service. [facility] makes no representation, warranty, or guarantee regarding the reliability, timeliness, quality, safety, suitability, or availability of any of the Transport Services or that they will be error free. I fully understand that traveling by vehicle involves risks and dangers of serious bodily injury, including permanent disability, paralysis, and death. I agree, on behalf of myself and on behalf of any minor child using the Transport Services for whom I am the parent or legal guardian, that the entire risk arising out of my use of the [employer] remains solely with me, to the maximum extent permitted under applicable law. The [employer] are provided "as is"  and "as available." [facility] disclaims all representations and warranties, express, implied or statutory, not expressly set out in these terms, including the implied warranties of merchantability and fitness for a particular purpose. I hereby waive and release [facility], its agents, employees, officers, directors, representatives, insurers, attorneys, assigns, successors, subsidiaries, and affiliates from any and all past, present, or future claims, demands, liabilities, actions, causes of action, or suits of any kind directly or indirectly arising from acceptance and use of the [employer]. I further waive and release [facility] and its affiliates from all present and future liability and responsibility for any injury or death to persons or damages to property caused by or related to the use of the [employer]. I have read this Waiver and Release of Liability, and I understand the terms used in it and their legal significance. This Waiver is freely and voluntarily given with the understanding that my right (as well as the right of any minor child for whom I am the parent or legal guardian using the [employer]) to legal recourse against [facility] in connection with the [employer] is knowingly surrendered in return for use of these services.   I attest that I read the consent document to [name], gave Ms. Vanderlinden the opportunity to ask questions and answered the questions asked (if any). I affirm that Yamil Dougher then provided consent for she's participation in this program.     Farley Ly

## 2021-01-10 NOTE — Telephone Encounter (Signed)
Placed a call to [facility] Transportation: The representative states they tried calling [name]. Bianca Goodman states she hasn't received a call. The representative in Transportation stated" To have Bianca Goodman call them. Placed a call to Bianca Goodman, she will give them a call. She was instructed to call back with any questions or concerns, she verbalizes understanding.

## 2021-01-31 ENCOUNTER — Other Ambulatory Visit: Payer: Self-pay

## 2021-02-01 ENCOUNTER — Encounter: Payer: Self-pay | Admitting: Family Medicine

## 2021-02-01 ENCOUNTER — Ambulatory Visit (INDEPENDENT_AMBULATORY_CARE_PROVIDER_SITE_OTHER): Payer: 59 | Admitting: Family Medicine

## 2021-02-01 VITALS — BP 102/74 | HR 83 | Temp 97.9°F | Ht 65.0 in | Wt 158.6 lb

## 2021-02-01 DIAGNOSIS — R251 Tremor, unspecified: Secondary | ICD-10-CM | POA: Insufficient documentation

## 2021-02-01 DIAGNOSIS — I10 Essential (primary) hypertension: Secondary | ICD-10-CM

## 2021-02-01 DIAGNOSIS — E039 Hypothyroidism, unspecified: Secondary | ICD-10-CM

## 2021-02-01 DIAGNOSIS — Z1231 Encounter for screening mammogram for malignant neoplasm of breast: Secondary | ICD-10-CM | POA: Diagnosis not present

## 2021-02-01 DIAGNOSIS — M792 Neuralgia and neuritis, unspecified: Secondary | ICD-10-CM | POA: Diagnosis not present

## 2021-02-01 LAB — TSH: TSH: 0.14 u[IU]/mL — ABNORMAL LOW (ref 0.35–5.50)

## 2021-02-01 MED ORDER — LEVOTHYROXINE SODIUM 88 MCG PO TABS: 88.0000 ug | ORAL_TABLET | Freq: Every day | ORAL | 6 refills | Status: DC

## 2021-02-01 NOTE — Addendum Note (Signed)
Addended by: [name] on: 02/01/2021 06:33 PM   Modules accepted: Orders

## 2021-02-01 NOTE — Progress Notes (Signed)
[facility] [address] [place] [address] Dept: [phone] Dept Fax: [phone]  Chronic Care Office Visit  Subjective:    Patient ID: Bianca Goodman, female    DOB: Apr 27, 1961, 60 y.o..   MRN: [id number]  Chief Complaint  Patient presents with   Follow-up    7 wk f/u.    History of Present Illness:  Patient is in today for reassessment of chronic medical issues.  Bianca Goodman continues to work with orthopedics August Saucer) related to her left arm pain. Although there had been plans to refer her for an epidural steroid injection, she has had difficulties finding a facility that her insurance will cover. She also notes there is a need for her to take something for anxiety prior to the procedure. She is engaged now with [facility] Maisie Fus) for her chronic pain management. She also has an upcoming neurology appointment.   Bianca Goodman is on losartan, verapamil, and HCTZ for hypertension control. She is pleased to see she has lost some weight. She has increased her consumption of salads.   Bianca Goodman has a history of hypothyroidism. We decreased her levothyroxine dose earlier this summer due to a low TSH level.  Past Medical History: Patient Active Problem List   Diagnosis Date Noted   Tremor of left hand 02/01/2021   Atypical squamous cell changes of undetermined significance (ASCUS) on cervical cytology with negative high risk human papilloma virus (HPV) test result 11/09/2020   Essential hypertension 10/28/2020   Hypothyroidism 10/28/2020   History of endometriosis 10/28/2020   Prediabetes 10/28/2020   Radicular pain in left arm 09/14/2020   Past Surgical History:  Procedure Laterality Date   APPENDECTOMY     1997   CERVICAL SPINE SURGERY     C4-5 spinal fusion   RIGHT OOPHORECTOMY     scalenectomy     1985   Family History  Problem Relation Age of Onset   Cancer Mother    Heart  disease Father    Heart disease Brother    Outpatient Medications Prior to Visit  Medication Sig Dispense Refill   gabapentin (NEURONTIN) 100 MG capsule Take 1 capsule (100 mg total) by mouth 2 (two) times daily. 60 capsule 3   gabapentin (NEURONTIN) 300 MG capsule Take 1 capsule (300 mg total) by mouth at bedtime. 60 capsule 1   hydrochlorothiazide (MICROZIDE) 12.5 MG capsule Take 1 capsule (12.5 mg total) by mouth daily. 90 capsule 3   HYDROcodone-acetaminophen (NORCO/VICODIN) 5-325 MG tablet Take 1 tablet by mouth 2 (two) times daily as needed for moderate pain. Do Not Fill Before 01/07/2021 60 tablet 0   levothyroxine (SYNTHROID) 100 MCG tablet Take 1 tablet (100 mcg total) by mouth daily. 90 tablet 3   losartan (COZAAR) 50 MG tablet Take 1 tablet (50 mg total) by mouth daily. 90 tablet 3   verapamil (CALAN) 120 MG tablet Take 1 tablet (120 mg total) by mouth 2 (two) times daily. 180 tablet 3   No facility-administered medications prior to visit.   Allergies  Allergen Reactions   Cortizone-10 [Hydrocortisone] Rash   Tramadol Nausea Only   Objective:   Today's Vitals   02/01/21 1111  BP: 102/74  Pulse: 83  Temp: 97.9 F (36.6 C)  TempSrc: Temporal  SpO2: 96%  Weight: 158 lb 9.6 oz (71.9 kg)  Height: 5\' 5"  (1.651 m)   Body mass index is 26.39 kg/m.   General: Well developed, well nourished.  No acute distress. Psych: Alert and oriented. Normal mood and affect.  Health Maintenance Due  Topic Date Due   Pneumococcal Vaccine 39-12 Years old (1 - PCV) Never done   Zoster Vaccines- Shingrix (1 of 2) Never done   COLONOSCOPY (Pts 45-30yrs Insurance coverage will need to be confirmed)  Never done   MAMMOGRAM  Never done   COVID-19 Vaccine (3 - Moderna risk series) 06/15/2020   INFLUENZA VACCINE  Never done   Lab Results Lab Results  Component Value Date   TSH 0.01 (L) 10/28/2020   Lab Results  Component Value Date   CHOL 151 10/28/2020   HDL 40.40 10/28/2020    LDLCALC 87 10/28/2020   TRIG 119.0 10/28/2020   CHOLHDL 4 10/28/2020   Lab Results  Component Value Date   HGBA1C 6.3 12/14/2020     Assessment & Plan:   1. Essential hypertension Blood pressure is at goal and doing quite well on her current triple therapy. Her weight loss has likely helped with this. We will continue her current therapy and reassess in 3 months.  2. Acquired hypothyroidism We will reassess her TSH today.  - TSH  3. Radicular pain in left arm Reviewed consult notes from Dr. August Saucer and Ms. Thomas. Continue to work with orthopedics and physiatry.  4. Encounter for screening mammogram for malignant neoplasm of breast  - MM DIGITAL SCREENING BILATERAL; Future  [name], MD

## 2021-02-04 ENCOUNTER — Encounter: Payer: Self-pay | Admitting: Family Medicine

## 2021-02-04 ENCOUNTER — Encounter: Payer: 59 | Attending: Registered Nurse | Admitting: Registered Nurse

## 2021-02-04 ENCOUNTER — Encounter: Payer: Self-pay | Admitting: Registered Nurse

## 2021-02-04 ENCOUNTER — Other Ambulatory Visit: Payer: Self-pay

## 2021-02-04 VITALS — BP 111/78 | HR 100 | Temp 98.7°F | Ht 65.0 in | Wt 160.0 lb

## 2021-02-04 DIAGNOSIS — M792 Neuralgia and neuritis, unspecified: Secondary | ICD-10-CM

## 2021-02-04 DIAGNOSIS — G894 Chronic pain syndrome: Secondary | ICD-10-CM | POA: Diagnosis present

## 2021-02-04 DIAGNOSIS — M7061 Trochanteric bursitis, right hip: Secondary | ICD-10-CM

## 2021-02-04 DIAGNOSIS — M7062 Trochanteric bursitis, left hip: Secondary | ICD-10-CM | POA: Diagnosis present

## 2021-02-04 DIAGNOSIS — G8929 Other chronic pain: Secondary | ICD-10-CM

## 2021-02-04 DIAGNOSIS — R5381 Other malaise: Secondary | ICD-10-CM

## 2021-02-04 DIAGNOSIS — M542 Cervicalgia: Secondary | ICD-10-CM

## 2021-02-04 DIAGNOSIS — Z79899 Other long term (current) drug therapy: Secondary | ICD-10-CM | POA: Diagnosis present

## 2021-02-04 DIAGNOSIS — M545 Low back pain, unspecified: Secondary | ICD-10-CM | POA: Insufficient documentation

## 2021-02-04 DIAGNOSIS — Z5181 Encounter for therapeutic drug level monitoring: Secondary | ICD-10-CM

## 2021-02-04 DIAGNOSIS — M5412 Radiculopathy, cervical region: Secondary | ICD-10-CM

## 2021-02-04 MED ORDER — HYDROCODONE-ACETAMINOPHEN 5-325 MG PO TABS: 1.0000 | ORAL_TABLET | Freq: Three times a day (TID) | ORAL | 0 refills | Status: DC | PRN

## 2021-02-04 NOTE — Progress Notes (Signed)
Subjective:    Patient ID: Bianca Goodman, female    DOB: [birth year], 60 y.o.   MRN: [id number]  IFO:YDXAJ Bianca Goodman is a 60 y.o. female who returns for follow up appointment for chronic pain and medication refill. She states her pain is located in her neck radiaiting into her bilateral shoulders, mid- lower back pain and bilateral hip pain. She also reports she is only receiving 7- 8 hours of relief of her pain with current medication regimen. Assessment performed, he medication frequency will be changed to every 8 hours , she verbalizes understanding. She rates her pain 7. Her current exercise regime is walking and performing stretching exercises.  Bianca Goodman will call her insurance company, to see if X-ray is covered by her plan, it was denied in the past.   This provider spoke with Bianca Goodman via translator  Bianca Goodman Morphine equivalent is  10.00 MME.   Last UDS was Performed on 12/28/2020, it was consistent.      Pain Inventory Average Pain 10 Pain Right Now 7 My pain is constant, sharp, and stabbing  In the last 24 hours, has pain interfered with the following? General activity 10 Relation with others 10 Enjoyment of life 10 What TIME of day is your pain at its worst? morning  and evening Sleep (in general) Fair  Pain is worse with: walking, bending, sitting, inactivity, standing, and some activites Pain improves with: rest, medication, and heat, brace support Relief from Meds:   Family History  Problem Relation Age of Onset   Cancer Mother    Heart disease Father    Heart disease Brother    Social History   Socioeconomic History   Marital status: Widowed    Spouse name: Not on file   Number of children: 1   Years of education: Not on file   Highest education level: Not on file  Occupational History   Not on file  Tobacco Use   Smoking status: Former    Types: Cigarettes    Quit date: 09/20/2019    Years since quitting: 1.3   Smokeless tobacco: Never  Vaping Use    Vaping Use: Never used  Substance and Sexual Activity   Alcohol use: Not Currently   Drug use: Never   Sexual activity: Yes  Other Topics Concern   Not on file  Social History Narrative   Not on file   Social Determinants of Health   Financial Resource Strain: Not on file  Food Insecurity: Not on file  Transportation Needs: Not on file  Physical Activity: Not on file  Stress: Not on file  Social Connections: Not on file   Past Surgical History:  Procedure Laterality Date   APPENDECTOMY     1997   CERVICAL SPINE SURGERY     C4-5 spinal fusion   RIGHT OOPHORECTOMY     scalenectomy     1985   Past Surgical History:  Procedure Laterality Date   APPENDECTOMY     1997   CERVICAL SPINE SURGERY     C4-5 spinal fusion   RIGHT OOPHORECTOMY     scalenectomy     1985   Past Medical History:  Diagnosis Date   Endometriosis    Hypertension    Thyroid disease    BP 111/78   Pulse 100   Temp 98.7 F (37.1 C)   Ht 5\' 5"  (1.651 m)   Wt 160 lb (72.6 kg)   SpO2 98%   BMI 26.63 kg/m  Opioid Risk Score:   Fall Risk Score:  `1  Depression screen PHQ 2/9  Depression screen [facility] 2/9 12/28/2020 10/26/2020 09/28/2020  Decreased Interest 1 1 1   Down, Depressed, Hopeless 1 1 2   PHQ - 2 Score 2 2 3   Altered sleeping - 1 0  Tired, decreased energy - - 1  Change in appetite - - 1  Feeling bad or failure about yourself  - - 0  Trouble concentrating - - 0  Moving slowly or fidgety/restless - - 0  Suicidal thoughts - - 0  PHQ-9 Score - 3 5  Difficult doing work/chores - - Very difficult    Review of Systems  Musculoskeletal:  Positive for neck pain.       Left hand pain  All other systems reviewed and are negative.     Objective:   Physical Exam Vitals and nursing note reviewed.  Constitutional:      Appearance: Normal appearance.  Neck:     Comments: Cervical Paraspinal Tenderness: C-5-C-6 Cardiovascular:     Rate and Rhythm: Normal rate and regular rhythm.      Pulses: Normal pulses.     Heart sounds: Normal heart sounds.  Pulmonary:     Effort: Pulmonary effort is normal.     Breath sounds: Normal breath sounds.  Musculoskeletal:     Cervical back: Normal range of motion and neck supple.     Comments: Normal Muscle Bulk and Muscle Testing Reveals:  Upper Extremities: Right: Full ROM and Muscle Strength  5/5 Left Upper Extremity: Decreased ROM 90 Degrees and Muscle Strength 4/5 Bilateral AC Joint Tenderness Thoracic and Lumbar Hypersensitivity Bilateral Greater Trochanter Tenderness Lower Extremities: Decreased ROM and Muscle Strength 5/5 Bilateral Lower Extremities Flexion Produces Pain into her Bilateral Hips Arises from Table Slowly Antalgic  Gait     Skin:    General: Skin is warm and dry.  Neurological:     Mental Status: She is alert and oriented to person, place, and time.  Psychiatric:        Mood and Affect: Mood normal.        Behavior: Behavior normal.         Assessment & Plan:  Acute Exacerbation of Chronic Low Back Pain: RX: Lumbar X-ray> Bianca Goodman will call her insurance company to see if Xray is covered under plan. Continue to monitor.  Cervicalgia/ Cervical Radiculitis: Continue HEP as Tolerated. Continue Gabapentin 100 mg BID during the day and Gabapentin 300 mg at HS. Continue to monitor. Bianca Goodman has a scheduled appointment with Dr on 02/04/2021.  Left Arm Radicular Pain: Continue Gabapentin . Continue to Monitor.02/04/2021 Chronic Pain Syndrome: Refilled Hydrocodone 5/325 mg one tablet three times a day as needed for pain #90. Continue current medication regimen. Continue to monitor. 02/04/2021 4. Left Hand Pain: Ortho Following. Continue to monitor. 02/04/2021 5. Chronic Thoracic Pain: Continue HEP as Tolerated. Continue current medication regimen. Continue to monitor. 02/04/2021 6.Bilateral  Hip Pain/Left Greater Trochanter Bursitis: RX: X-ray . Bianca Goodman will call her insurance company to see if X-rays is  covered. Continue to alternate Ice and Heat Therapy . Continue t monitor. 02/04/2021 7. Physical Deconditioning: RX: Physical Therapy. Continue to Monitor.    F/U in 1 month

## 2021-02-21 ENCOUNTER — Ambulatory Visit (INDEPENDENT_AMBULATORY_CARE_PROVIDER_SITE_OTHER): Payer: 59 | Admitting: Neurology

## 2021-02-21 ENCOUNTER — Encounter: Payer: Self-pay | Admitting: Neurology

## 2021-02-21 ENCOUNTER — Other Ambulatory Visit: Payer: Self-pay

## 2021-02-21 VITALS — BP 148/95 | HR 96 | Ht 62.0 in | Wt 163.0 lb

## 2021-02-21 DIAGNOSIS — G20 Parkinson's disease: Secondary | ICD-10-CM | POA: Diagnosis not present

## 2021-02-21 MED ORDER — PRAMIPEXOLE DIHYDROCHLORIDE 0.125 MG PO TABS: 0.3750 mg | ORAL_TABLET | Freq: Three times a day (TID) | ORAL | 5 refills | Status: DC

## 2021-02-21 NOTE — Progress Notes (Signed)
Subjective:    Patient ID: Bianca Goodman is a 60 y.o. female.  HPI    [name], MD, PhD [facility] [address] [address] [place], [place] [address]  Dear Bianca Goodman,   I saw your patient, Bianca Goodman, upon your kind request in my neurologic clinic today for initial consultation of her tremor.  The patient is accompanied by a [place] interpreter Engineer, manufacturing) today.  As you know, [name] is a 60 year old right-handed woman with an underlying medical history of chronic neck pain, on chronic narcotic pain medication, hypertension, hypothyroidism, and mildly overweight state, who reports an approximately 30-month history of tremors affecting her left upper extremity.  In addition, she has noticed stiffness and pain in her hands, inability to fully extend or close her hand, she has had slowness and changes in her walk, she feels slower, sometimes she feels "stupid".  She moved from [place] some 7 or 8 months ago with her son.  She lives with her only son and her sister.  She has a total of 1 sister and 3 brothers, none of whom have tremors, no family history of Parkinson's disease.  She fell about a month ago in the parking lot at the doctor's office.  She hit her lower back, she did not hit her head or passed out thankfully.  She had fallen some years ago, she was still residing in [place] at the time, she was on [place] at the time.  She has chronic neck pain, she has had neck problems since 2005, she is status post ACDF in 2005.  She had a recent cervical spine MRI without contrast on 11/13/2020 and I reviewed the results: IMPRESSION: 1. Disc bulge with uncovertebral spurring at C6-7 with resultant moderate left C7 foraminal stenosis. This is suspected to be the symptomatic level. 2. Degenerative disc bulge with uncovertebral spurring at C5-6 with resultant mild to moderate left and mild right C6 foraminal stenosis. 3. Broad-based disc bulge at C3-4 with resultant mild  spinal stenosis. 4. Prior ACDF at C4-5 without residual or recurrent stenosis.   I reviewed your office note from 11/23/2020.   She is a non-smoker and does not drink alcohol, she limits her caffeine to 1 cup of coffee in the morning.  She hydrates well with water.  She is widowed, she is not currently working.  She has worked in [employer] before.  She is always been independent.  She gets tearful towards the end of our visit as we discussed her possible diagnosis.  She indicates that her sister-in-law who is a physician in the [place], indicated to her that she may have Parkinson's disease and she did not believe her sister-in-law.  Her Past Medical History Is Significant For: Past Medical History:  Diagnosis Date   Endometriosis    Hypertension    Thyroid disease     Her Past Surgical History Is Significant For: Past Surgical History:  Procedure Laterality Date   APPENDECTOMY     1997   CERVICAL SPINE SURGERY     C4-5 spinal fusion   RIGHT OOPHORECTOMY     scalenectomy     1985    Her Family History Is Significant For: Family History  Problem Relation Age of Onset   Cancer Mother    Heart disease Father    Heart disease Brother     Her Social History Is Significant For: Social History   Socioeconomic History   Marital status: Widowed    Spouse name:  Not on file   Number of children: 1   Years of education: Not on file   Highest education level: Not on file  Occupational History   Not on file  Tobacco Use   Smoking status: Former    Types: Cigarettes    Quit date: 09/20/2019    Years since quitting: 1.4   Smokeless tobacco: Never  Vaping Use   Vaping Use: Never used  Substance and Sexual Activity   Alcohol use: Not Currently   Drug use: Never   Sexual activity: Yes  Other Topics Concern   Not on file  Social History Narrative   Caffeine one cup daily.   Lives with son and her sister.  Widow.  Education HS.     Social Determinants of Health    Financial Resource Strain: Not on file  Food Insecurity: Not on file  Transportation Needs: Not on file  Physical Activity: Not on file  Stress: Not on file  Social Connections: Not on file    Her Allergies Are:  Allergies  Allergen Reactions   Cortizone-10 [Hydrocortisone] Rash   Tramadol Nausea Only  :   Her Current Medications Are:  Outpatient Encounter Medications as of 02/21/2021  Medication Sig   gabapentin (NEURONTIN) 100 MG capsule Take 1 capsule (100 mg total) by mouth 2 (two) times daily.   gabapentin (NEURONTIN) 300 MG capsule Take 1 capsule (300 mg total) by mouth at bedtime.   hydrochlorothiazide (MICROZIDE) 12.5 MG capsule Take 1 capsule (12.5 mg total) by mouth daily.   HYDROcodone-acetaminophen (NORCO/VICODIN) 5-325 MG tablet Take 1 tablet by mouth 3 (three) times daily as needed for moderate pain.   levothyroxine (SYNTHROID) 88 MCG tablet Take 1 tablet (88 mcg total) by mouth daily.   losartan (COZAAR) 50 MG tablet Take 1 tablet (50 mg total) by mouth daily.   verapamil (CALAN) 120 MG tablet Take 1 tablet (120 mg total) by mouth 2 (two) times daily.   No facility-administered encounter medications on file as of 02/21/2021.  :   Review of Systems:  Out of a complete 14 point review of systems, all are reviewed and negative with the exception of these symptoms as listed below:  Review of Systems  Neurological:        Her with ANA interpreter.  Pt has had Tremor in L hand for about 7 months.  Progressively owrse.  Pain in knuckles And pinky/ring finger stuck together.  Notes some freezing, (not being able to do what her brain tells her to do movement), also fall one month ago, hard to get up.     Objective:  Neurological Exam  Physical Exam Physical Examination:   Vitals:   02/21/21 1446  BP: (!) 148/95  Pulse: 96    General Examination: The patient is a very pleasant 60 y.o. female in no acute distress. She appears well-developed and well-nourished  and well groomed.   HEENT: Normocephalic, atraumatic, pupils are equal, round and reactive to light, extraocular tracking is fairly well-preserved, face is symmetric with mild facial masking, mild nuchal rigidity, mild limitation to neck turns to both sides.  She has no lip, neck or jaw tremor.  She has no significant hypophonia, no dysarthria or voice tremor.  Airway examination reveals normal findings, tongue protrudes centrally and palate elevates symmetrically.  Hearing is grossly intact.   Chest: Clear to auscultation without wheezing, rhonchi or crackles noted.  Heart: S1+S2+0, regular and normal without murmurs, rubs or gallops noted.   Abdomen: Soft,  non-tender and non-distended with normal bowel sounds appreciated on auscultation.  Extremities: There is no pitting edema in the distal lower extremities bilaterally.   Skin: Warm and dry without trophic changes noted.   Musculoskeletal: exam reveals no obvious joint deformities, with the exception of tenderness in the left hand in the MCP areas.  She feels like it is hard for her to straighten her fingers and open and close her hand.  She reports neck pain as well.    Neurologically:  Mental status: The patient is awake, alert and oriented in all 4 spheres. Her immediate and remote memory, attention, language skills and fund of knowledge are appropriate. There is no evidence of aphasia, agnosia, apraxia or anomia. Speech is clear with normal prosody and enunciation. Thought process is linear. Mood is normal and affect is normal.  Cranial nerves II - XII are as described above under HEENT exam.  Her right shoulder is mildly higher than left at baseline.    On 02/21/2021: On Archimedes spiral drawing she was unable to do the task with the left hand, with the right hand there was no trembling.  Handwriting with the right hand was legible, not micrographic, not tremulous.  Motor exam: Normal bulk, and strength is noted with the exception of  pain limitation in the left hand.  She has an increased tone in the left upper extremity with mild cogwheeling noted, slight increase in tone in the right upper extremity.  She has fairly normal tone in the right lower extremity, perhaps mild increase in tone in the left lower extremity.  She has a fairly consistent mild to moderate resting tremor in the left upper extremity.  She has no significant postural or action tremor.  She does have difficulty extending her fingers and reports stiffness and pain in the left hand.  Romberg is not tested due to safety concerns.  Reflexes are symmetric, but overall diminished.  On fine motor testing, she has no significant difficulty in the right upper extremity with finger taps and hand movements and rapid alternating patting.  She has fairly good foot taps on the right side.  On the left side she has moderate difficulty with fine motor control including finger taps and hand movements.  Foot tapping is moderately impaired on the left.  Cerebellar testing: No dysmetria or intention tremor. There is no truncal or gait ataxia.  Sensory exam: intact to light touch in the upper and lower extremities.  Gait, station and balance: She stands stands without difficulty, posture is mildly stooped for age.  She has a tendency to flex her left hand and arm while walking and standing.  She walks with slowness no telltale shuffling but significantly decreased arm swing on the left.   Assessment and plan:   In summary, Anaria Kroner is a very pleasant 60 y.o.-year old female with an underlying medical history of chronic neck pain, on chronic narcotic pain medication, hypertension, hypothyroidism, and mildly overweight state, who presents for evaluation of her tremor disorder of approximately 8 months duration.  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  I talked with the patient at length about this possible  diagnosis, its prognosis and symptomatic treatment options.  She did get quite tearful today towards the end of the visit but was able to compose herself and is amenable to trying medication.  We talked about the importance of healthy lifestyle, good outlook on things, trying to stay well-hydrated and well rested.  She is advised to start a trial of pramipexole 0.125 mg strength with gradual titration to up to 3 pills 3 times daily in the next several weeks.  We talked about expectations, possible common side effects and limitations including the rare possibility of impulse control disorder.  I explained this blackbox warning to her.  She is agreeable to starting medication.  We will continue to follow her clinically.  She is advised to follow-up routinely in this clinic in 3 months, sooner if needed.  For now, I do not recommend any new scans.  She had a recent cervical spine MRI.  At some point, I may consider a brain MRI.  She was given detailed written instructions and a new prescription which was sent electronically.  I answered all her questions today and she was in agreement with the plan.  Thank you very much for allowing me to participate in the care of this nice patient. If I can be of any further assistance to you please do not hesitate to call me at [phone].  Sincerely,   [name], MD, PhD

## 2021-02-21 NOTE — Patient Instructions (Signed)
It was nice to meet you today.  I do believe you may have Parkinson's disease, affecting your left side.  This disease does progress with time. It can affect your balance, your memory, your mood, your bowel and bladder function, your posture, balance and walking and your activities of daily living. However, there are good supportive treatments and symptomatic treatments available, so most patients have a change to a good quality life and life expectancy is not typically altered.   I do want to suggest a few things today:  Remember to drink plenty of fluid at least 6 glasses (8 oz each), eat healthy meals and do not skip any meals. Try to eat protein with a every meal and eat a healthy snack such as fruit or nuts in between meals. Try to keep a regular sleep-wake schedule and try to exercise daily, particularly in the form of walking, 20-30 minutes a day, if you can.   As discussed, I would like for you to start a medication called Mirapex (generic name: pramipexole) 0.125 mg: Take 1 pill once daily for 3 days, then 1 pill twice daily for 3 days, then 1 pill three times a day thereafter.  After 1 week increase to 2 pills 3 times a day, after another week increase to 3 pills 3 times a day.    Common side effects reported are: Sedation, sleepiness, nausea, vomiting, and rare side effects are confusion, hallucinations, swelling in legs, and abnormal behaviors, including impulse control problems, which can manifest as excessive eating, obsessions with food or gambling, or hypersexuality.  Please remember, no medication is without potential side effects and not every medication is right for every patient with PD.   Try to stay active physically and mentally. Engage in social activities in your community and with your family and try to keep up with current events by reading the newspaper or watching the news. Try to do word puzzles and you may like to do puzzles and brain games on the computer.   You can  consider taking coenzyme Q 10 over the counter: 400 mg 3 times a day.   Please follow-up in about 3 months in this clinic.    For any emergencies you know to call 911 or go to the nearest emergency room.

## 2021-02-28 ENCOUNTER — Ambulatory Visit: Payer: 59 | Attending: Registered Nurse | Admitting: Physical Therapy

## 2021-02-28 ENCOUNTER — Other Ambulatory Visit: Payer: Self-pay

## 2021-02-28 ENCOUNTER — Encounter: Payer: Self-pay | Admitting: Physical Therapy

## 2021-02-28 DIAGNOSIS — M25551 Pain in right hip: Secondary | ICD-10-CM

## 2021-02-28 DIAGNOSIS — M25552 Pain in left hip: Secondary | ICD-10-CM

## 2021-02-28 DIAGNOSIS — R2689 Other abnormalities of gait and mobility: Secondary | ICD-10-CM

## 2021-02-28 DIAGNOSIS — M79642 Pain in left hand: Secondary | ICD-10-CM | POA: Diagnosis present

## 2021-02-28 DIAGNOSIS — R2681 Unsteadiness on feet: Secondary | ICD-10-CM

## 2021-02-28 DIAGNOSIS — M542 Cervicalgia: Secondary | ICD-10-CM | POA: Diagnosis present

## 2021-02-28 DIAGNOSIS — M546 Pain in thoracic spine: Secondary | ICD-10-CM | POA: Diagnosis present

## 2021-02-28 DIAGNOSIS — M6281 Muscle weakness (generalized): Secondary | ICD-10-CM | POA: Diagnosis present

## 2021-02-28 DIAGNOSIS — Z9181 History of falling: Secondary | ICD-10-CM
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
## 2021-03-04 ENCOUNTER — Ambulatory Visit: Payer: 59 | Admitting: Physical Therapy

## 2021-03-07 ENCOUNTER — Encounter: Payer: Self-pay | Admitting: Registered Nurse

## 2021-03-07 ENCOUNTER — Encounter: Payer: 59 | Attending: Registered Nurse | Admitting: Registered Nurse

## 2021-03-07 ENCOUNTER — Other Ambulatory Visit: Payer: Self-pay

## 2021-03-07 VITALS — BP 119/79 | HR 93 | Temp 98.9°F | Ht 62.0 in | Wt 158.6 lb

## 2021-03-07 DIAGNOSIS — M7061 Trochanteric bursitis, right hip: Secondary | ICD-10-CM | POA: Insufficient documentation

## 2021-03-07 DIAGNOSIS — G894 Chronic pain syndrome: Secondary | ICD-10-CM | POA: Insufficient documentation

## 2021-03-07 DIAGNOSIS — M542 Cervicalgia: Secondary | ICD-10-CM | POA: Insufficient documentation

## 2021-03-07 DIAGNOSIS — Z5181 Encounter for therapeutic drug level monitoring: Secondary | ICD-10-CM | POA: Insufficient documentation

## 2021-03-07 DIAGNOSIS — M7062 Trochanteric bursitis, left hip: Secondary | ICD-10-CM | POA: Insufficient documentation

## 2021-03-07 DIAGNOSIS — M5412 Radiculopathy, cervical region: Secondary | ICD-10-CM | POA: Insufficient documentation

## 2021-03-07 DIAGNOSIS — M792 Neuralgia and neuritis, unspecified: Secondary | ICD-10-CM | POA: Diagnosis not present

## 2021-03-07 DIAGNOSIS — Z79899 Other long term (current) drug therapy: Secondary | ICD-10-CM | POA: Insufficient documentation

## 2021-03-07 MED ORDER — GABAPENTIN 100 MG PO CAPS: 100.0000 mg | ORAL_CAPSULE | Freq: Two times a day (BID) | ORAL | 3 refills | Status: DC

## 2021-03-07 MED ORDER — HYDROCODONE-ACETAMINOPHEN 5-325 MG PO TABS: 1.0000 | ORAL_TABLET | Freq: Three times a day (TID) | ORAL | 0 refills | Status: DC | PRN

## 2021-03-07 NOTE — Progress Notes (Signed)
Subjective:    Patient ID: Bianca Goodman, female    DOB: Dec 09, 1960, 60 y.o.   MRN: [id number]  HPI: Bianca Goodman is a 60 y.o. female who returns for follow up appointment for chronic pain and medication refill. She states her pain is located in her neck radiating into her left shoulder, left arm to her elbow. She also reports bilateral hip pain.She rates her pain 9. Her current exercise regime is walking and performing stretching exercises.  [name] Morphine equivalent is 15.00 MME.   Last UDS was Performed on 12/28/2020, it was consistent.    Pain Inventory Average Pain 9 Pain Right Now 9 My pain is sharp, burning, and stabbing  In the last 24 hours, has pain interfered with the following? General activity 9 Relation with others 9 Enjoyment of life 9 What TIME of day is your pain at its worst? daytime Sleep (in general) Poor  Pain is worse with: walking, sitting, and some activites Pain improves with: rest, heat/ice, and medication Relief from Meds: 7  Family History  Problem Relation Age of Onset   Cancer Mother    Heart disease Father    Heart disease Brother    Social History   Socioeconomic History   Marital status: Widowed    Spouse name: Not on file   Number of children: 1   Years of education: Not on file   Highest education level: Not on file  Occupational History   Not on file  Tobacco Use   Smoking status: Former    Types: Cigarettes    Quit date: 09/20/2019    Years since quitting: 1.4   Smokeless tobacco: Never  Vaping Use   Vaping Use: Never used  Substance and Sexual Activity   Alcohol use: Not Currently   Drug use: Never   Sexual activity: Yes  Other Topics Concern   Not on file  Social History Narrative   Caffeine one cup daily.   Lives with son and her sister.  Widow.  Education HS.     Social Determinants of Health   Financial Resource Strain: Not on file  Food Insecurity: Not on file  Transportation Needs: Not on file  Physical  Activity: Not on file  Stress: Not on file  Social Connections: Not on file   Past Surgical History:  Procedure Laterality Date   APPENDECTOMY     1997   CERVICAL SPINE SURGERY     C4-5 spinal fusion   RIGHT OOPHORECTOMY     scalenectomy     1985   Past Surgical History:  Procedure Laterality Date   APPENDECTOMY     1997   CERVICAL SPINE SURGERY     C4-5 spinal fusion   RIGHT OOPHORECTOMY     scalenectomy     1985   Past Medical History:  Diagnosis Date   Endometriosis    Hypertension    Parkinson's disease (HCC)    Thyroid disease    Temp 98.9 F (37.2 C)   Ht 5\' 2"  (1.575 m)   Wt 158 lb 9.6 oz (71.9 kg)   BMI 29.01 kg/m   Opioid Risk Score:   Fall Risk Score:  `1  Depression screen PHQ 2/9  Depression screen [facility] 2/9 02/04/2021 12/28/2020 10/26/2020 09/28/2020  Decreased Interest 1 1 1 1   Down, Depressed, Hopeless 1 1 1 2   PHQ - 2 Score 2 2 2 3   Altered sleeping - - 1 0  Tired, decreased energy - - - 1  Change in appetite - - - 1  Feeling bad or failure about yourself  - - - 0  Trouble concentrating - - - 0  Moving slowly or fidgety/restless - - - 0  Suicidal thoughts - - - 0  PHQ-9 Score - - 3 5  Difficult doing work/chores - - - Very difficult      Review of Systems  Constitutional: Negative.   HENT: Negative.    Eyes: Negative.   Respiratory: Negative.    Cardiovascular: Negative.   Gastrointestinal: Negative.   Endocrine: Negative.   Genitourinary: Negative.   Skin: Negative.   Allergic/Immunologic: Negative.   Neurological: Negative.   Hematological: Negative.   Psychiatric/Behavioral:  Positive for dysphoric mood.       Objective:   Physical Exam Vitals and nursing note reviewed.  Constitutional:      Appearance: Normal appearance.  Cardiovascular:     Rate and Rhythm: Normal rate and regular rhythm.     Pulses: Normal pulses.     Heart sounds: Normal heart sounds.  Pulmonary:     Effort: Pulmonary effort is normal.     Breath  sounds: Normal breath sounds.  Musculoskeletal:     Cervical back: Normal range of motion and neck supple.     Comments: Normal Muscle Bulk and Muscle Testing Reveals:  Upper Extremities: Right: Full ROM and Muscle Strength 5/5 Left Upper Extremity: Decreased ROM 45 Degrees  and Muscle Strength 1/5 Lower Extremities: Full ROM and Muscle Strength 5/5 Bilateral Lower Extremities Flexion Produces Pain into her Groin Arises from Table with ease  Narrow Based  Gait     Skin:    General: Skin is warm and dry.  Neurological:     Mental Status: She is alert and oriented to person, place, and time.  Psychiatric:        Mood and Affect: Mood normal.        Behavior: Behavior normal.         Assessment & Plan:  Cervicalgia/ Cervical Radiculitis: Continue HEP as Tolerated. Continue Gabapentin 100 mg BID .Continue to monitor. 03/07/2021 Left Arm Radicular Pain: Continue Gabapentin . Continue to Monitor.03/07/2021 Chronic Pain Syndrome: Continue current medication regimen. Continue to monitor. 03/07/2021 4. Left Hand Pain: Ortho Following. Continue to monitor. 03/07/2021 5. Chronic Thoracic Pain: No complaints today. Continue current medication regimen. Continue to monitor. 03/07/2021 6. Bilateral Greater Trochanter Bursitis:  Continue alternate Ice and Heat Therapy . Continue to monitor. 03/07/2021   F/U in 1 month

## 2021-03-08 ENCOUNTER — Telehealth: Payer: Self-pay

## 2021-03-08 NOTE — Telephone Encounter (Signed)
Placed a call to [facility],  They will update Bianca Goodman address into their system.  Placed a call to Bianca Goodman regarding the above, she verbalizes understanding. Bianca Goodman states she's not able to attend physical therapy on 03/09/2021, due to increase intensity of pain, She was moving, out patient therapy and [facility] was informed of the above.

## 2021-03-08 NOTE — Telephone Encounter (Signed)
[name] called back.   Per patient you advised her to call you back with her new address. Address has been corrected in Epic. She wants to talk to you about transportation and future appointments.   Call back phone [phone].

## 2021-03-09 ENCOUNTER — Ambulatory Visit: Payer: 59

## 2021-03-15 ENCOUNTER — Ambulatory Visit: Payer: 59 | Admitting: Physical Therapy

## 2021-03-15 ENCOUNTER — Encounter: Payer: Self-pay | Admitting: Physical Therapy

## 2021-03-15 ENCOUNTER — Other Ambulatory Visit: Payer: Self-pay

## 2021-03-15 DIAGNOSIS — M542 Cervicalgia: Secondary | ICD-10-CM

## 2021-03-15 DIAGNOSIS — M25552 Pain in left hip: Secondary | ICD-10-CM

## 2021-03-15 DIAGNOSIS — M79642 Pain in left hand: Secondary | ICD-10-CM

## 2021-03-15 DIAGNOSIS — R2689 Other abnormalities of gait and mobility: Secondary | ICD-10-CM

## 2021-03-15 DIAGNOSIS — M25551 Pain in right hip: Secondary | ICD-10-CM

## 2021-03-15 DIAGNOSIS — R2681 Unsteadiness on feet: Secondary | ICD-10-CM | POA: Diagnosis not present

## 2021-03-15 DIAGNOSIS — Z9181 History of falling: Secondary | ICD-10-CM

## 2021-03-15 DIAGNOSIS — M546 Pain in thoracic spine: Secondary | ICD-10-CM

## 2021-03-15 DIAGNOSIS — M6281 Muscle weakness (generalized): Secondary | ICD-10-CM
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
## 2021-03-22 ENCOUNTER — Ambulatory Visit: Payer: 59 | Admitting: Physical Therapy

## 2021-03-24 ENCOUNTER — Ambulatory Visit: Payer: 59 | Attending: Registered Nurse

## 2021-03-30 ENCOUNTER — Ambulatory Visit: Payer: 59 | Admitting: Physical Therapy

## 2021-04-01 ENCOUNTER — Ambulatory Visit: Payer: 59

## 2021-04-04 ENCOUNTER — Ambulatory Visit: Payer: 59 | Admitting: Physical Therapy

## 2021-04-07 ENCOUNTER — Telehealth: Payer: Self-pay

## 2021-04-07 ENCOUNTER — Encounter: Payer: 59 | Attending: Registered Nurse | Admitting: Registered Nurse

## 2021-04-07 ENCOUNTER — Encounter: Payer: Self-pay | Admitting: Registered Nurse

## 2021-04-07 ENCOUNTER — Other Ambulatory Visit: Payer: Self-pay

## 2021-04-07 VITALS — BP 129/85 | HR 85 | Temp 98.1°F | Ht 62.0 in | Wt 160.0 lb

## 2021-04-07 DIAGNOSIS — M546 Pain in thoracic spine: Secondary | ICD-10-CM | POA: Insufficient documentation

## 2021-04-07 DIAGNOSIS — M545 Low back pain, unspecified: Secondary | ICD-10-CM | POA: Insufficient documentation

## 2021-04-07 DIAGNOSIS — G894 Chronic pain syndrome: Secondary | ICD-10-CM | POA: Insufficient documentation

## 2021-04-07 DIAGNOSIS — M792 Neuralgia and neuritis, unspecified: Secondary | ICD-10-CM | POA: Diagnosis present

## 2021-04-07 DIAGNOSIS — M542 Cervicalgia: Secondary | ICD-10-CM | POA: Diagnosis present

## 2021-04-07 DIAGNOSIS — Z79899 Other long term (current) drug therapy: Secondary | ICD-10-CM | POA: Diagnosis present

## 2021-04-07 DIAGNOSIS — Z5181 Encounter for therapeutic drug level monitoring: Secondary | ICD-10-CM | POA: Insufficient documentation

## 2021-04-07 DIAGNOSIS — G8929 Other chronic pain: Secondary | ICD-10-CM | POA: Diagnosis present

## 2021-04-07 DIAGNOSIS — M7061 Trochanteric bursitis, right hip: Secondary | ICD-10-CM | POA: Diagnosis present

## 2021-04-07 DIAGNOSIS — M5412 Radiculopathy, cervical region: Secondary | ICD-10-CM | POA: Diagnosis present

## 2021-04-07 DIAGNOSIS — M7062 Trochanteric bursitis, left hip: Secondary | ICD-10-CM | POA: Insufficient documentation

## 2021-04-07 MED ORDER — HYDROCODONE-ACETAMINOPHEN 7.5-325 MG PO TABS: 1.0000 | ORAL_TABLET | Freq: Three times a day (TID) | ORAL | 0 refills | Status: DC | PRN

## 2021-04-07 NOTE — Telephone Encounter (Signed)
Patient called stating that pharmacy didn't have enough medication or something like that. She didn't understand. I tried calling the pharmacy to see what was going on but could not get a hold of someone. Will try again.

## 2021-04-07 NOTE — Telephone Encounter (Signed)
PMP was Reviewed.  Hydrocodone e-scribed today. Sybil RN called Ms. Caspers regarding the above

## 2021-04-07 NOTE — Progress Notes (Signed)
Subjective:    Patient ID: Bianca Goodman, female    DOB: May 17, 1961, 60 y.o.   MRN: [id number]  HPI: Bianca Goodman is a 60 y.o. female who returns for follow up appointment for chronic pain and medication refill. She states her pain is located in her neck radiating into her left shoulder, lower back pain and bilateral hip pain. She also reports she experiencing increase intensity of pain and only receiving about 4 hours of pain relief with her current medication regimen. We will increase her hydrocodone to 7.5 mg one tablet three times a day as needed for pain, she verbalizes understanding. She rates her pain 10. Her current exercise regime is walking and performing stretching exercises.  Bianca Goodman Morphine equivalent is 15.00 MME.   Last UDS was Performed on 12/28/2020, it was consistent.     Pain Inventory Average Pain 10 Pain Right Now 10 My pain is sharp and burning  In the last 24 hours, has pain interfered with the following? General activity 10 Relation with others 8 Enjoyment of life 8 What TIME of day is your pain at its worst? daytime Sleep (in general) Poor  Pain is worse with: walking, inactivity, and standing Pain improves with: medication Relief from Meds: 8  Family History  Problem Relation Age of Onset   Cancer Mother    Heart disease Father    Heart disease Brother    Social History   Socioeconomic History   Marital status: Widowed    Spouse name: Not on file   Number of children: 1   Years of education: Not on file   Highest education level: Not on file  Occupational History   Not on file  Tobacco Use   Smoking status: Former    Types: Cigarettes    Quit date: 09/20/2019    Years since quitting: 1.5   Smokeless tobacco: Never  Vaping Use   Vaping Use: Never used  Substance and Sexual Activity   Alcohol use: Not Currently   Drug use: Never   Sexual activity: Yes  Other Topics Concern   Not on file  Social History Narrative   Caffeine one cup daily.    Lives with son and her sister.  Widow.  Education HS.     Social Determinants of Health   Financial Resource Strain: Not on file  Food Insecurity: Not on file  Transportation Needs: Not on file  Physical Activity: Not on file  Stress: Not on file  Social Connections: Not on file   Past Surgical History:  Procedure Laterality Date   APPENDECTOMY     1997   CERVICAL SPINE SURGERY     C4-5 spinal fusion   RIGHT OOPHORECTOMY     scalenectomy     1985   Past Surgical History:  Procedure Laterality Date   APPENDECTOMY     1997   CERVICAL SPINE SURGERY     C4-5 spinal fusion   RIGHT OOPHORECTOMY     scalenectomy     1985   Past Medical History:  Diagnosis Date   Endometriosis    Hypertension    Parkinson's disease (HCC)    Thyroid disease    Ht 5\' 2"  (1.575 m)   Wt 160 lb (72.6 kg)   BMI 29.26 kg/m   Opioid Risk Score:   Fall Risk Score:  `1  Depression screen PHQ 2/9  Depression screen [facility] 2/9 04/07/2021 03/07/2021 02/04/2021 12/28/2020 10/26/2020 09/28/2020  Decreased Interest 3 3 1 1 1  1  Down, Depressed, Hopeless 3 3 1 1 1 2   PHQ - 2 Score 6 6 2 2 2 3   Altered sleeping - - - - 1 0  Tired, decreased energy - - - - - 1  Change in appetite - - - - - 1  Feeling bad or failure about yourself  - - - - - 0  Trouble concentrating - - - - - 0  Moving slowly or fidgety/restless - - - - - 0  Suicidal thoughts - - - - - 0  PHQ-9 Score - - - - 3 5  Difficult doing work/chores - - - - - Very difficult     Review of Systems  Constitutional:  Positive for appetite change.  HENT: Negative.    Eyes: Negative.   Respiratory: Negative.    Cardiovascular: Negative.   Gastrointestinal: Negative.   Endocrine: Negative.   Genitourinary: Negative.   Musculoskeletal:  Positive for gait problem.  Skin: Negative.   Allergic/Immunologic: Negative.   Neurological:  Positive for weakness and numbness.  Hematological: Negative.   Psychiatric/Behavioral:  Positive for dysphoric  mood and sleep disturbance.       Objective:   Physical Exam Vitals and nursing note reviewed.  Constitutional:      Appearance: Normal appearance.  Cardiovascular:     Rate and Rhythm: Normal rate and regular rhythm.     Pulses: Normal pulses.     Heart sounds: Normal heart sounds.  Pulmonary:     Effort: Pulmonary effort is normal.     Breath sounds: Normal breath sounds.  Musculoskeletal:     Cervical back: Normal range of motion and neck supple.     Comments: Normal Muscle Bulk and Muscle Testing Reveals:  Upper Extremities: Right: Decreased ROM 90 Degrees and Muscle Strength 4/5 Left Upper Extremity: Decreased ROM 45 Degrees and Muscle Strength 4/5 Left AC Joint Tenderness Thoracic Paraspinal Tenderness: T-1-T-3 Mainly Left Side Lumbar Paraspinal Tenderness: L-4-L-5 Lower Extremities : Full ROM and Muscle Strength 5/5 Arises from Table Slowly Antalgic Gait     Skin:    General: Skin is warm and dry.  Neurological:     Mental Status: She is alert and oriented to person, place, and time.  Psychiatric:        Mood and Affect: Mood normal.        Behavior: Behavior normal.         Assessment & Plan:  Cervicalgia/ Cervical Radiculitis: Continue HEP as Tolerated. Continue Gabapentin 100 mg BID .Continue to monitor. 04/07/2021 Left Arm Radicular Pain: Continue Gabapentin . Continue to Monitor.04/07/2021 Chronic Pain Syndrome: Increase: Hydrocodone 7.5 mg/325 one tablet three times a day as needed for pain #90. We will continue the opioid monitoring program, this consists of regular clinic visits, examinations, urine drug screen, pill counts as well as use of 04/09/2021 Controlled Substance Reporting system. A 12 month History has been reviewed on the 04/09/2021 Controlled Substance Reporting System Today.   Continue current medication regimen. Continue to monitor. 04/07/2021 4. Left Hand Pain: Ortho Following. Continue to monitor. 04/07/2021 5. Chronic Thoracic  Pain: Continue current medication regimen. Continue to monitor. 04/07/2021 6. Bilateral Greater Trochanter Bursitis:  Continue alternate Ice and Heat Therapy . Continue to monitor. 04/07/2021   F/U in 1 month

## 2021-04-07 NOTE — Telephone Encounter (Signed)
I have let Bianca Goodman know that she will be able to get medication at the [facility] today.

## 2021-04-07 NOTE — Telephone Encounter (Signed)
I have found the medication at [facility] in [place]. Please send the refill to the [address] [place] location

## 2021-05-03 ENCOUNTER — Ambulatory Visit (INDEPENDENT_AMBULATORY_CARE_PROVIDER_SITE_OTHER): Payer: 59 | Admitting: Family Medicine

## 2021-05-03 ENCOUNTER — Other Ambulatory Visit: Payer: Self-pay

## 2021-05-03 ENCOUNTER — Encounter: Payer: Self-pay | Admitting: Family Medicine

## 2021-05-03 VITALS — BP 122/82 | HR 80 | Temp 97.7°F | Ht 62.0 in | Wt 162.0 lb

## 2021-05-03 DIAGNOSIS — F321 Major depressive disorder, single episode, moderate: Secondary | ICD-10-CM | POA: Insufficient documentation

## 2021-05-03 DIAGNOSIS — E039 Hypothyroidism, unspecified: Secondary | ICD-10-CM

## 2021-05-03 DIAGNOSIS — I10 Essential (primary) hypertension: Secondary | ICD-10-CM

## 2021-05-03 DIAGNOSIS — F329 Major depressive disorder, single episode, unspecified: Secondary | ICD-10-CM | POA: Diagnosis not present

## 2021-05-03 DIAGNOSIS — G20 Parkinson's disease: Secondary | ICD-10-CM | POA: Insufficient documentation

## 2021-05-03 DIAGNOSIS — F32A Depression, unspecified: Secondary | ICD-10-CM | POA: Insufficient documentation

## 2021-05-03 DIAGNOSIS — G20A1 Parkinson's disease without dyskinesia, without mention of fluctuations: Secondary | ICD-10-CM | POA: Insufficient documentation

## 2021-05-03 LAB — TSH: TSH: 0.72 u[IU]/mL (ref 0.35–5.50)

## 2021-05-03 MED ORDER — FLUOXETINE HCL 20 MG PO CAPS
20.0000 mg | ORAL_CAPSULE | Freq: Every day | ORAL | 3 refills | Status: DC
Start: 2021-05-03 — End: 2022-02-24

## 2021-05-03 NOTE — Progress Notes (Signed)
[facility] [address] [place] [address] Dept: [phone] Dept Fax: [phone]  Chronic Care Office Visit  Subjective:    Patient ID: Bianca Goodman, female    DOB: March 22, 1961, 60 y.o..   MRN: [id number]  Chief Complaint  Patient presents with   Follow-up    3 mo f/u HTN.    History of Present Illness:  Patient is in today for reassessment of chronic medical issues.  Bianca Goodman has had a tremor of the left arm. She was referred to Dr. Frances Furbish (neurology) who feels this represents Parkinson's disease. She has started on pramipexole and notes this has decreased her tremor some. Bianca Goodman broke down in tears discussing her diagnosis. She  notes that this does exhibit in other ways, such as feeling off balance when leaning forward or having trouble initiating movement. She notes that she is trying to be strong for her son, but has fears of the Parkinson's progressing and makign her more of an invalid.  Bianca Goodman continues to work with [facility] Maisie Fus) for her chronic pain management.    Bianca Goodman is on losartan, verapamil, and HCTZ for hypertension control.    Bianca Goodman has a history of hypothyroidism. We decreased her levothyroxine dose twice this year due to a low TSH level. She notes she is having less sweating than before.  Past Medical History: Patient Active Problem List   Diagnosis Date Noted   Parkinsonian tremor (HCC) 05/03/2021   Depression 05/03/2021   Tremor of left hand 02/01/2021   Atypical squamous cell changes of undetermined significance (ASCUS) on cervical cytology with negative high risk human papilloma virus (HPV) test result 11/09/2020   Essential hypertension 10/28/2020   Hypothyroidism 10/28/2020   History of endometriosis 10/28/2020   Prediabetes 10/28/2020   Radicular pain in left arm 09/14/2020   Past Surgical History:  Procedure Laterality Date    APPENDECTOMY     1997   CERVICAL SPINE SURGERY     C4-5 spinal fusion   RIGHT OOPHORECTOMY     scalenectomy     1985   Family History  Problem Relation Age of Onset   Cancer Mother    Heart disease Father    Heart disease Brother    Outpatient Medications Prior to Visit  Medication Sig Dispense Refill   gabapentin (NEURONTIN) 100 MG capsule Take 1 capsule (100 mg total) by mouth 2 (two) times daily. 60 capsule 3   hydrochlorothiazide (MICROZIDE) 12.5 MG capsule Take 1 capsule (12.5 mg total) by mouth daily. 90 capsule 3   HYDROcodone-acetaminophen (NORCO) 7.5-325 MG tablet Take 1 tablet by mouth 3 (three) times daily as needed for moderate pain. Please Fill Today 90 tablet 0   levothyroxine (SYNTHROID) 88 MCG tablet Take 1 tablet (88 mcg total) by mouth daily. 30 tablet 6   losartan (COZAAR) 50 MG tablet Take 1 tablet (50 mg total) by mouth daily. 90 tablet 3   pramipexole (MIRAPEX) 0.125 MG tablet Take 3 tablets (0.375 mg total) by mouth 3 (three) times daily. Follow titration instructions provided separately in writing. 270 tablet 5   verapamil (CALAN) 120 MG tablet Take 1 tablet (120 mg total) by mouth 2 (two) times daily. 180 tablet 3   No facility-administered medications prior to visit.   Allergies  Allergen Reactions   Cortizone-10 [Hydrocortisone] Rash   Tramadol Nausea Only    Objective:   Today's Vitals   05/03/21 1000  BP:  122/82  Pulse: 80  Temp: 97.7 F (36.5 C)  TempSrc: Temporal  SpO2: 99%  Weight: 162 lb (73.5 kg)  Height: 5\' 2"  (1.575 m)   Body mass index is 29.63 kg/m.   General: Well developed, well nourished. No acute distress. Neuro: Episodes of resting tremor noted int he left hand. Psych: Alert and oriented. Normal mood and affect.  Health Maintenance Due  Topic Date Due   Pneumococcal Vaccine 15-78 Years old (1 - PCV) Never done   Zoster Vaccines- Shingrix (1 of 2) Never done   COLONOSCOPY (Pts 45-36yrs Insurance coverage will need to be  confirmed)  Never done   MAMMOGRAM  Never done   COVID-19 Vaccine (3 - Moderna risk series) 06/15/2020   INFLUENZA VACCINE  Never done   Lab Results Last thyroid functions Lab Results  Component Value Date   TSH 0.14 (L) 02/01/2021   Assessment & Plan:   1. Parkinsonian tremor [facility]) Bianca Goodman is having some response to the pramipexole. She will be following up with Dr. Carlynn Purl in early Feb. I tried to provide reassurance about management options for Parkinson's. I also encouraged her to share with her son about her concerns, as he would likely want to help her int his time of need.  2. Essential hypertension Blood pressure is at goal. Continue verapamil, losartan, and HCTZ.  3. Acquired hypothyroidism Due to recheck TSH to see if the last dose reduction now has her at goal.  - TSH  4. Reactive depression Bianca Goodman is having depressive symptoms in response to learning about having likely developed Parkinson's disease. I recommend we start her on an SSRI to see if we can reduce some of the tearfulness and sad feelings she is currently experiencing.  - FLUoxetine (PROZAC) 20 MG capsule; Take 1 capsule (20 mg total) by mouth daily.  Dispense: 90 capsule; Refill: 3  Carlynn Purl, MD

## 2021-05-04 ENCOUNTER — Encounter: Payer: 59 | Attending: Registered Nurse | Admitting: Registered Nurse

## 2021-05-04 ENCOUNTER — Other Ambulatory Visit: Payer: Self-pay

## 2021-05-04 VITALS — BP 111/78 | HR 82 | Temp 98.4°F | Ht 62.0 in | Wt 160.0 lb

## 2021-05-04 DIAGNOSIS — M792 Neuralgia and neuritis, unspecified: Secondary | ICD-10-CM

## 2021-05-04 DIAGNOSIS — G8929 Other chronic pain: Secondary | ICD-10-CM | POA: Diagnosis present

## 2021-05-04 DIAGNOSIS — Z79891 Long term (current) use of opiate analgesic: Secondary | ICD-10-CM | POA: Diagnosis present

## 2021-05-04 DIAGNOSIS — G894 Chronic pain syndrome: Secondary | ICD-10-CM

## 2021-05-04 DIAGNOSIS — M5412 Radiculopathy, cervical region: Secondary | ICD-10-CM

## 2021-05-04 DIAGNOSIS — G20 Parkinson's disease: Secondary | ICD-10-CM

## 2021-05-04 DIAGNOSIS — M545 Low back pain, unspecified: Secondary | ICD-10-CM | POA: Diagnosis present

## 2021-05-04 DIAGNOSIS — Z5181 Encounter for therapeutic drug level monitoring: Secondary | ICD-10-CM | POA: Diagnosis present

## 2021-05-04 DIAGNOSIS — M542 Cervicalgia: Secondary | ICD-10-CM

## 2021-05-04 MED ORDER — HYDROCODONE-ACETAMINOPHEN 7.5-325 MG PO TABS: 1.0000 | ORAL_TABLET | Freq: Three times a day (TID) | ORAL | 0 refills | Status: DC | PRN

## 2021-05-04 NOTE — Progress Notes (Signed)
Subjective:    Patient ID: Bianca Goodman, female    DOB: Dec 03, 1960, 60 y.o.   MRN: [id number]  HPI: Bianca Goodman is a 60 y.o. female who returns for follow up appointment for chronic pain and medication refill. She states her  pain is located in her neck radiating into her left shoulder and lower back pain. She rates her pain 7. Her current exercise regime is walking and performing stretching exercises.  Ms. Strauss Morphine equivalent is 22/50 MME.   UDS ordered today.     Pain Inventory Average Pain 8 Pain Right Now 7 My pain is sharp and burning  In the last 24 hours, has pain interfered with the following? General activity 7 Relation with others 8 Enjoyment of life 7 What TIME of day is your pain at its worst? daytime Sleep (in general) Poor  Pain is worse with: walking and standing Pain improves with: heat/ice and medication Relief from Meds: 8  Family History  Problem Relation Age of Onset   Cancer Mother    Heart disease Father    Heart disease Brother    Social History   Socioeconomic History   Marital status: Widowed    Spouse name: Not on file   Number of children: 1   Years of education: Not on file   Highest education level: Not on file  Occupational History   Not on file  Tobacco Use   Smoking status: Former    Types: Cigarettes    Quit date: 09/20/2019    Years since quitting: 1.6   Smokeless tobacco: Never  Vaping Use   Vaping Use: Never used  Substance and Sexual Activity   Alcohol use: Not Currently   Drug use: Never   Sexual activity: Yes  Other Topics Concern   Not on file  Social History Narrative   Caffeine one cup daily.   Lives with son and her sister.  Widow.  Education HS.     Social Determinants of Health   Financial Resource Strain: Not on file  Food Insecurity: Not on file  Transportation Needs: Not on file  Physical Activity: Not on file  Stress: Not on file  Social Connections: Not on file   Past Surgical History:   Procedure Laterality Date   APPENDECTOMY     1997   CERVICAL SPINE SURGERY     C4-5 spinal fusion   RIGHT OOPHORECTOMY     scalenectomy     1985   Past Surgical History:  Procedure Laterality Date   APPENDECTOMY     1997   CERVICAL SPINE SURGERY     C4-5 spinal fusion   RIGHT OOPHORECTOMY     scalenectomy     1985   Past Medical History:  Diagnosis Date   Endometriosis    Hypertension    Parkinson's disease (HCC)    Thyroid disease    BP 111/78    Pulse 82    Temp 98.4 F (36.9 C) (Oral)    Ht 5\' 2"  (1.575 m)    Wt 160 lb (72.6 kg)    SpO2 99%    BMI 29.26 kg/m   Opioid Risk Score:   Fall Risk Score:  `1  Depression screen PHQ 2/9  Depression screen [facility] 2/9 04/07/2021 03/07/2021 02/04/2021 12/28/2020 10/26/2020 09/28/2020  Decreased Interest 3 3 1 1 1 1   Down, Depressed, Hopeless 3 3 1 1 1 2   PHQ - 2 Score 6 6 2 2 2 3   Altered sleeping - - - -  1 0  Tired, decreased energy - - - - - 1  Change in appetite - - - - - 1  Feeling bad or failure about yourself  - - - - - 0  Trouble concentrating - - - - - 0  Moving slowly or fidgety/restless - - - - - 0  Suicidal thoughts - - - - - 0  PHQ-9 Score - - - - 3 5  Difficult doing work/chores - - - - - Very difficult     Review of Systems  Constitutional:  Positive for appetite change.  Gastrointestinal:  Positive for abdominal pain.  Musculoskeletal:  Positive for back pain, gait problem and neck pain.       Left wrist pain  Neurological:  Positive for weakness and numbness.  Psychiatric/Behavioral:  Positive for dysphoric mood and sleep disturbance.   All other systems reviewed and are negative.     Objective:   Physical Exam Vitals and nursing note reviewed.  Constitutional:      Appearance: Normal appearance.  Neck:     Comments: Cervical Paraspinal Tenderness: C-5-C-6 Cardiovascular:     Rate and Rhythm: Normal rate and regular rhythm.     Pulses: Normal pulses.     Heart sounds: Normal heart sounds.   Pulmonary:     Effort: Pulmonary effort is normal.     Breath sounds: Normal breath sounds.  Musculoskeletal:     Cervical back: Normal range of motion and neck supple.     Comments: Normal Muscle Bulk and Muscle Testing Reveals:  Upper Extremities: Right: Full ROM and Muscle Strength 5/5 Bilateral AC Joint Tenderness: L>R Lumbar Paraspinal Tenderness: L-3-L-5 Lower Extremities : Right: Full ROM and Muscle Strength 5/5 Left Lower Extremity: Decreased ROM and Muscle Strength 5/5 Left Lower Extremity  Flexion  Produces Pain into Left Lower Extremity Arises from Table Slowly Narrow Based Gait     Skin:    General: Skin is warm and dry.  Neurological:     Mental Status: She is alert and oriented to person, place, and time.  Psychiatric:        Mood and Affect: Mood normal.        Behavior: Behavior normal.         Assessment & Plan:  Cervicalgia/ Cervical Radiculitis: Continue HEP as Tolerated. Continue Gabapentin 100 mg BID .Continue to monitor. 05/04/2021 Left Arm Radicular Pain: Continue Gabapentin . Continue to Monitor.05/04/2021 2. Chronic Pain Syndrome: Hydrocodone 7.5 mg/325 one tablet three times a day as needed for pain #90. We will continue the opioid monitoring program, this consists of regular clinic visits, examinations, urine drug screen, pill counts as well as use of [place] Controlled Substance Reporting system. A 12 month History has been reviewed on the [place] Controlled Substance Reporting System Today.   Continue current medication regimen. Continue to monitor. 05/04/2021 3. Left Hand Pain: Ortho Following. Continue to monitor. 05/04/2021 4. Chronic Thoracic Pain: Continue current medication regimen. Continue to monitor. 05/04/2021 5. Bilateral Greater Trochanter Bursitis:  Continue alternate Ice and Heat Therapy . Continue to monitor. 05/04/2021   F/U in 1 month

## 2021-05-10 ENCOUNTER — Encounter: Payer: Self-pay | Admitting: Registered Nurse

## 2021-05-12 LAB — TOXASSURE SELECT,+ANTIDEPR,UR

## 2021-05-17 ENCOUNTER — Telehealth: Payer: Self-pay | Admitting: *Deleted

## 2021-05-17 NOTE — Telephone Encounter (Signed)
Urine drug screen for this encounter is consistent for prescribed medication 

## 2021-05-24 ENCOUNTER — Ambulatory Visit: Payer: 59 | Admitting: Neurology

## 2021-06-01 ENCOUNTER — Encounter: Payer: 59 | Admitting: Registered Nurse

## 2021-06-08 ENCOUNTER — Encounter: Payer: Self-pay | Admitting: Registered Nurse

## 2021-06-08 ENCOUNTER — Encounter: Payer: 59 | Attending: Registered Nurse | Admitting: Registered Nurse

## 2021-06-08 ENCOUNTER — Other Ambulatory Visit: Payer: Self-pay

## 2021-06-08 VITALS — BP 113/79 | HR 91 | Ht 62.0 in | Wt 160.0 lb

## 2021-06-08 DIAGNOSIS — G894 Chronic pain syndrome: Secondary | ICD-10-CM | POA: Diagnosis present

## 2021-06-08 DIAGNOSIS — M542 Cervicalgia: Secondary | ICD-10-CM | POA: Diagnosis present

## 2021-06-08 DIAGNOSIS — Z5181 Encounter for therapeutic drug level monitoring: Secondary | ICD-10-CM | POA: Diagnosis present

## 2021-06-08 DIAGNOSIS — Z79891 Long term (current) use of opiate analgesic: Secondary | ICD-10-CM | POA: Diagnosis present

## 2021-06-08 DIAGNOSIS — G8929 Other chronic pain: Secondary | ICD-10-CM | POA: Diagnosis present

## 2021-06-08 DIAGNOSIS — R251 Tremor, unspecified: Secondary | ICD-10-CM

## 2021-06-08 DIAGNOSIS — M792 Neuralgia and neuritis, unspecified: Secondary | ICD-10-CM | POA: Diagnosis present

## 2021-06-08 DIAGNOSIS — M545 Low back pain, unspecified: Secondary | ICD-10-CM | POA: Diagnosis present

## 2021-06-08 DIAGNOSIS — M5412 Radiculopathy, cervical region: Secondary | ICD-10-CM

## 2021-06-08 MED ORDER — HYDROCODONE-ACETAMINOPHEN 7.5-325 MG PO TABS: 1.0000 | ORAL_TABLET | Freq: Three times a day (TID) | ORAL | 0 refills | Status: DC | PRN

## 2021-06-08 NOTE — Progress Notes (Signed)
Subjective:    Patient ID: Bianca Goodman, female    DOB: 1960-11-07, 61 y.o.   MRN: [id number]  HPI: Bianca Goodman is a 61 y.o. female who returns for follow up appointment for chronic pain and medication refill. She states her  pain is located in her neck radiating into her left shoulder and left arm to her elbow with tingling and burning. She also reports lower back pain. She rates her pain 8. Her current exercise regime is walking and performing stretching exercises with bands.  Bianca Goodman Morphine equivalent is 22.50 MME.   Last UDS was Performed on 05/04/2021, it was consistent.   Interpretor in room and all questions answered via interpretor.     Pain Inventory Average Pain 8 Pain Right Now 8 My pain is constant, sharp, and burning  In the last 24 hours, has pain interfered with the following? General activity 8 Relation with others 8 Enjoyment of life 9 What TIME of day is your pain at its worst? morning  Sleep (in general) Poor  Pain is worse with: walking, standing, and some activites Pain improves with: rest, heat/ice, and medication Relief from Meds: 8  Family History  Problem Relation Age of Onset   Cancer Mother    Heart disease Father    Heart disease Brother    Social History   Socioeconomic History   Marital status: Widowed    Spouse name: Not on file   Number of children: 1   Years of education: Not on file   Highest education level: Not on file  Occupational History   Not on file  Tobacco Use   Smoking status: Former    Types: Cigarettes    Quit date: 09/20/2019    Years since quitting: 1.7   Smokeless tobacco: Never  Vaping Use   Vaping Use: Never used  Substance and Sexual Activity   Alcohol use: Not Currently   Drug use: Never   Sexual activity: Yes  Other Topics Concern   Not on file  Social History Narrative   Caffeine one cup daily.   Lives with son and her sister.  Widow.  Education HS.     Social Determinants of Health   Financial  Resource Strain: Not on file  Food Insecurity: Not on file  Transportation Needs: Not on file  Physical Activity: Not on file  Stress: Not on file  Social Connections: Not on file   Past Surgical History:  Procedure Laterality Date   APPENDECTOMY     1997   CERVICAL SPINE SURGERY     C4-5 spinal fusion   RIGHT OOPHORECTOMY     scalenectomy     1985   Past Surgical History:  Procedure Laterality Date   APPENDECTOMY     1997   CERVICAL SPINE SURGERY     C4-5 spinal fusion   RIGHT OOPHORECTOMY     scalenectomy     1985   Past Medical History:  Diagnosis Date   Endometriosis    Hypertension    Parkinson's disease (HCC)    Thyroid disease    BP 113/79    Pulse 91    Ht 5\' 2"  (1.575 m)    Wt 160 lb (72.6 kg)    SpO2 98%    BMI 29.26 kg/m   Opioid Risk Score:   Fall Risk Score:  `1  Depression screen PHQ 2/9  Depression screen [facility] 2/9 06/08/2021 05/04/2021 04/07/2021 03/07/2021 02/04/2021 12/28/2020 10/26/2020  Decreased Interest 0 2 3 3  1 1 1   Down, Depressed, Hopeless 1 2 3 3 1 1 1   PHQ - 2 Score 1 4 6 6 2 2 2   Altered sleeping - - - - - - 1  Tired, decreased energy - - - - - - -  Change in appetite - - - - - - -  Feeling bad or failure about yourself  - - - - - - -  Trouble concentrating - - - - - - -  Moving slowly or fidgety/restless - - - - - - -  Suicidal thoughts - - - - - - -  PHQ-9 Score - - - - - - 3  Difficult doing work/chores - - - - - - -     Review of Systems  Musculoskeletal:  Positive for back pain and neck pain.       Left lower arm pain Right hip pain spasms  Neurological:  Positive for weakness.  All other systems reviewed and are negative.     Objective:   Physical Exam Vitals and nursing note reviewed.  Constitutional:      Appearance: Normal appearance.  Neck:     Comments: Cervical Paraspinal Tenderness: C-5-C-6: Mainly Left Side Cardiovascular:     Rate and Rhythm: Normal rate and regular rhythm.     Pulses: Normal pulses.      Heart sounds: Normal heart sounds.  Pulmonary:     Effort: Pulmonary effort is normal.     Breath sounds: Normal breath sounds.  Musculoskeletal:     Cervical back: Normal range of motion and neck supple.     Comments: Normal Muscle Bulk and Muscle Testing Reveals:  Upper Extremities:Right Upper Extremity: Full  ROM and Muscle Strength 5/5 Left Upper Extremity: Decreased ROM 45 Degrees and Muscle Strength 2/5 Left AC Joint Tenderness  Lumbar Paraspinal Tenderness: L-3-L-5 Lower Extremities: Right: Decreased ROM and Muscle Strength 5/5 Right Lower Extremity Flexion Produces Pain into Her Right Lower Extremity Left Lower Extremity : Full ROM and Muscle Strength 5/5 Arises from Table Slowly Narrow Based Gait     Skin:    General: Skin is warm and dry.  Neurological:     Mental Status: She is alert and oriented to person, place, and time.  Psychiatric:        Mood and Affect: Mood normal.        Behavior: Behavior normal.         Assessment & Plan:  Cervicalgia/ Cervical Radiculitis: Continue HEP as Tolerated. Continue Gabapentin 100 mg BID .Continue to monitor. 06/08/2021 Left Arm Radicular Pain: Continue Gabapentin . Continue to Monitor.06/08/2021 2. Chronic Pain Syndrome: Hydrocodone 7.5 mg/325 one tablet three times a day as needed for pain #90. We will continue the opioid monitoring program, this consists of regular clinic visits, examinations, urine drug screen, pill counts as well as use of Controlled Substance Reporting system. A 12 month History has been reviewed on the 06/10/2021 Controlled Substance Reporting System Today.   Continue current medication regimen. Continue to monitor. 06/08/2021 3. Left Hand Pain: Ortho Following. Continue to monitor. 06/08/2021 4. Chronic Thoracic Pain: Continue current medication regimen. Continue to monitor. 06/08/2021 5. Bilateral Greater Trochanter Bursitis:  Continue alternate Ice and Heat Therapy . Continue to  monitor. 06/08/2021 6. Left Hand Tremor: Neurology Following   F/U in 1 month

## 2021-06-15 ENCOUNTER — Ambulatory Visit: Payer: 59 | Admitting: Family Medicine

## 2021-06-29 ENCOUNTER — Encounter: Payer: Self-pay | Admitting: Neurology

## 2021-06-29 ENCOUNTER — Ambulatory Visit (INDEPENDENT_AMBULATORY_CARE_PROVIDER_SITE_OTHER): Payer: 59 | Admitting: Neurology

## 2021-06-29 DIAGNOSIS — G20 Parkinson's disease: Secondary | ICD-10-CM

## 2021-06-29 MED ORDER — PRAMIPEXOLE DIHYDROCHLORIDE 0.5 MG PO TABS: 0.7500 mg | ORAL_TABLET | Freq: Three times a day (TID) | ORAL | 5 refills | Status: DC

## 2021-06-29 NOTE — Patient Instructions (Signed)
It was nice to see you again today.  I recommend we increase your pramipexole to 0.5 mg strength, take 1 pill 3 times a day for 2 weeks, then 1 1/2 pills 3 times a day thereafter.  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  I recommend you take your antidepressant Prozac (fluoxetine) in the mornings.

## 2021-06-29 NOTE — Progress Notes (Signed)
Subjective:    Patient ID: Bianca Goodman is a 61 y.o. female.  HPI    Interim history:   Bianca Goodman is a 61 year old right-handed woman with an underlying medical history of chronic neck pain, on chronic narcotic pain medication, hypertension, hypothyroidism, and mildly overweight state, who presents for follow-up consultation of her parkinsonism.  The patient is accompanied by a Spanish interpreter today.  I first met her at the request of her pain management nurse practitioner on 02/21/2021, at which time she reported a several month history of a tremor affecting her left upper extremity.  In addition, she had noticed stiffness and pain in her hands, fine motor dyscontrol and overall slowness in her movements.  She had moved from [place] recently.  She was noted to have parkinsonian features.  I asked her to start a dopamine agonist, namely pramipexole in low-dose with gradual titration.  The patient recently saw her pain management provider on 06/08/2021.  She has a follow-up this month.  She saw her primary care in December on 05/03/2021 and reported a modest reduction in her tremor in the left hand side. She was started on Prozac for depression.  Today, 06/29/2021: She reports that generally she is tolerating the medication and has noticed some improvement in her tremor but she still has significant problems with stiffness and fine motor dyscontrol.  Particularly first thing in the morning she feels like her posture is very stooped and it is hard for her to move, she has to eventually tell her feet to keep moving.  She has benefited from the depression medication but takes the Prozac at night.  She has not fallen recently.  She is not as tearful she reports.  She is wondering about her thyroid.  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  She is wondering  if she needed it looked at again.  She is encouraged to talk to her primary care physician about these issues.  She has noticed some problems swallowing at times.  On 2 occasions she felt like food was getting stuck.  It helped to use sips of water to loosen it up.  She denies any side effects from the pramipexole and is currently taking 0.375 mg 3 times daily.  She is agreeable to increasing this.  She does not have any significant constipation.  She has ongoing issues with neck pain.  She is trying to find a soft neck collar for support.  Her son gave her a neck collar but it was too hard and rigid, too uncomfortable and she could not use it.  The patient's allergies, current medications, family history, past medical history, past social history, past surgical history and problem list were reviewed and updated as appropriate.   Previously:   02/21/21: (She) reports an approximately 56-monthhistory of tremors affecting her left upper extremity.  In addition, she has noticed stiffness and pain in her hands, inability to fully extend or close her hand, she has had slowness and changes in her walk, she feels slower, sometimes she feels "stupid".  [place] 8 months [place] with her son.  She lives with her only son and her sister.  She has a total of 1 sister and 3 brothers, none of whom have tremors, no family history of Parkinson's disease.  She fell about a month ago in the parking lot at the doctor's office.  She hit her lower back, she did not hit her head or passed out thankfully.  She had fallen some years ago, she was still residing in [place] at the time, she was on [place] at the time.  She has chronic neck pain, she has had neck problems since 2005, she is status post ACDF in 2005.   She had a recent cervical spine MRI without contrast on 11/13/2020 and I reviewed the results: IMPRESSION: 1. Disc bulge with uncovertebral spurring at C6-7 with resultant moderate left C7 foraminal  stenosis. This is suspected to be the symptomatic level. 2. Degenerative disc bulge with uncovertebral spurring at C5-6 with resultant mild to moderate left and mild right C6 foraminal stenosis. 3. Broad-based disc bulge at C3-4 with resultant mild spinal stenosis. 4. Prior ACDF at C4-5 without residual or recurrent stenosis.   I reviewed your office note from 11/23/2020.    She is a non-smoker and does not drink alcohol, she limits her caffeine to 1 cup of coffee in the morning.  She hydrates well with water.   She is widowed, she is not currently working.  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  She is always been independent.  She gets tearful towards the end of our visit as we discussed her possible diagnosis.  She indicates that her sister-in-law who is a physician in the [place] indicated to her that she may have Parkinson's disease and she did not believe her sister-in-law.  Her Past Medical History Is Significant For: Past Medical History:  Diagnosis Date   Endometriosis    Hypertension    Parkinson's disease ([place])    Thyroid disease     Her Past Surgical History Is Significant For: Past Surgical History:  Procedure Laterality Date   APPENDECTOMY     1997   CERVICAL SPINE SURGERY     C4-5 spinal fusion   RIGHT OOPHORECTOMY     scalenectomy     1985    Her Family History Is Significant For: Family History  Problem Relation Age of Onset   Cancer Mother    Heart disease Father    Heart disease Brother     Her Social History Is Significant For: Social History   Socioeconomic History   Marital status: Widowed    Spouse name: Not on file   Number of children: 1   Years of education: Not on file   Highest education level: Not on file  Occupational History   Not on file  Tobacco Use   Smoking status: Former    Types: Cigarettes    Quit date: 09/20/2019    Years since quitting: 1.7   Smokeless tobacco: Never  Vaping Use   Vaping Use: Never used   Substance and Sexual Activity   Alcohol use: Not Currently   Drug use: Never   Sexual activity: Yes  Other Topics Concern   Not on file  Social History Narrative   Caffeine one cup daily, coffee with milk.   Lives with son.  Widow.  Education HS.     Social Determinants of Health   Financial Resource Strain: Not on file  Food Insecurity: Not on file  Transportation Needs: Not on file  Physical Activity: Not on file  Stress: Not on file  Social Connections: Not on file    Her Allergies Are:  Allergies  Allergen Reactions   Cortizone-10 [Hydrocortisone] Rash   Tramadol  Nausea Only  :   Her Current Medications Are:  Outpatient Encounter Medications as of 06/29/2021  Medication Sig   FLUoxetine (PROZAC) 20 MG capsule Take 1 capsule (20 mg total) by mouth daily.   gabapentin (NEURONTIN) 100 MG capsule Take 1 capsule (100 mg total) by mouth 2 (two) times daily.   hydrochlorothiazide (MICROZIDE) 12.5 MG capsule Take 1 capsule (12.5 mg total) by mouth daily.   HYDROcodone-acetaminophen (NORCO) 7.5-325 MG tablet Take 1 tablet by mouth 3 (three) times daily as needed for moderate pain.   levothyroxine (SYNTHROID) 88 MCG tablet Take 1 tablet (88 mcg total) by mouth daily.   losartan (COZAAR) 50 MG tablet Take 1 tablet (50 mg total) by mouth daily.   pramipexole (MIRAPEX) 0.125 MG tablet Take 3 tablets (0.375 mg total) by mouth 3 (three) times daily. Follow titration instructions provided separately in writing.   verapamil (CALAN) 120 MG tablet Take 1 tablet (120 mg total) by mouth 2 (two) times daily.   No facility-administered encounter medications on file as of 06/29/2021.  :  Review of Systems:  Out of a complete 14 point review of systems, all are reviewed and negative with the exception of these symptoms as listed below:   Review of Systems  Neurological:        Patient is here with an interpreter for follow-up of her Parkinsonism. She feels her symptoms are getting worse. She  tries writing. She states her handwriting is not the same. Lying down is much work for her. She states she gets very cold in her hands, feet, and parts of her skin, worse this week. She has gotten choked on her food twice after swallowing, it froze and she got nervous. There is a strange pain in the right inguinal area. She is waiting to see primary care for it.    Objective:  Neurological Exam  Physical Exam Physical Examination:   Vitals:   06/29/21 1454  BP: 123/82  Pulse: 93    General Examination: The patient is a very pleasant 61 y.o. female in no acute distress. She appears well-developed and well-nourished and well groomed.   HEENT: Normocephalic, atraumatic, pupils are equal, round and reactive to light, extraocular tracking is fairly well-preserved, face is symmetric with mild to moderate facial masking, mild nuchal rigidity, mild limitation to neck turns to both sides.  She has no lip, neck or jaw tremor.  She has no significant hypophonia, no dysarthria or voice tremor.  Airway examination reveals normal findings, tongue protrudes centrally and palate elevates symmetrically.  Hearing is grossly intact.    Chest: Clear to auscultation without wheezing, rhonchi or crackles noted.   Heart: S1+S2+0, regular and normal without murmurs, rubs or gallops noted.    Abdomen: Soft, non-tender and non-distended with normal bowel sounds appreciated on auscultation.   Extremities: There is no pitting edema in the distal lower extremities bilaterally.    Skin: Warm and dry without trophic changes noted.    Musculoskeletal: exam reveals no obvious joint deformities.     Neurologically:  Mental status: The patient is awake, alert and oriented in all 4 spheres. Her immediate and remote memory, attention, language skills and fund of knowledge are appropriate. There is no evidence of aphasia, agnosia, apraxia or anomia. Speech is clear with normal prosody and enunciation. Thought process is  linear. Mood is normal and affect is normal.  Cranial nerves II - XII are as described above under HEENT exam.  Her right shoulder is mildly higher  than left at baseline.     (On 02/21/2021: On Archimedes spiral drawing she was unable to do the task with the left hand, with the right hand there was no trembling.  Handwriting with the right hand was legible, not micrographic, not tremulous.)   Motor exam: Normal bulk, and strength is noted with the exception of pain limitation in the left hand.  She has an increased tone in the left upper extremity with mild cogwheeling noted, slight increase in tone in the right upper extremity.  She has fairly normal tone in the right lower extremity, perhaps mild increase in tone in the left lower extremity.  She has a fairly consistent mild resting tremor in the left upper extremity.  She has no significant postural or action tremor.  She does have difficulty extending her fingers and reports stiffness and pain in the left hand.  Romberg is not tested due to safety concerns.  On fine motor testing, she has no significant difficulty in the right upper extremity with finger taps and hand movements and rapid alternating patting.  She has fairly good foot taps on the right side.  On the left side she has moderate difficulty with fine motor control including finger taps and hand movements.  Foot tapping is moderately impaired on the left.  Cerebellar testing: No dysmetria or intention tremor. There is no truncal or gait ataxia.  Sensory exam: intact to light touch in the upper and lower extremities.  Gait, station and balance: She stands stands without difficulty, posture is mildly stooped for age.  She has a tendency to flex her left hand and arm while walking and standing.  She walks with slowness no telltale shuffling but significantly decreased arm swing on the left.  Balance is fairly well-preserved   Assessment and plan:    In summary, Bianca Goodman is a very pleasant  61 year old female with an underlying medical history of chronic neck pain, on chronic narcotic pain medication, hypertension, hypothyroidism, and mildly overweight state, who presents for follow-up consultation of her parkinsonism with left-sided predominance.  She has an approximately 1 year history of tremors and fine motor dyscontrol.  We started pramipexole in low-dose in 22 and she has been able to titrate up to 0.375 mg 3 times daily.  She tolerates it and has noticed a modest improvement in her tremor.  She is advised to increase the medication to 0.5 mg strength take 1 pill 3 times daily for the first 2 weeks and then 1-1/2 pills 3 times daily thereafter for total dose of 0.75 mg 3 times a day.  She is willing to try and increase this.  She is advised to consider taking her Prozac in the mornings instead of the evenings and talk to her primary care physician about her concern regarding her thyroid gland and pancreas.  She is advised to monitor her swallowing and sit upright when eating, try not to get distracted while eating and swallowing, eat smaller bites and chew properly, drink sips of water in between to soft in the food.  We can consider a swallow study if need be.  She is advised to follow-up in this clinic in about 4 months, sooner if needed.  She is encouraged to be well-hydrated and monitor for constipation issues.  I answered all her questions today and she was in agreement.   I spent 30 minutes in total face-to-face time and in reviewing records during pre-charting, more than 50% of which was spent in counseling  and coordination of care, reviewing test results, reviewing medications and treatment regimen and/or in discussing or reviewing the diagnosis of PD, the prognosis and treatment options. Pertinent laboratory and imaging test results that were available during this visit with the patient were reviewed by me and considered in my medical decision making (see chart for details).

## 2021-07-05 ENCOUNTER — Encounter: Payer: Self-pay | Admitting: Registered Nurse

## 2021-07-05 ENCOUNTER — Other Ambulatory Visit: Payer: Self-pay

## 2021-07-05 ENCOUNTER — Encounter: Payer: 59 | Attending: Registered Nurse | Admitting: Registered Nurse

## 2021-07-05 VITALS — BP 120/81 | HR 96 | Temp 99.1°F | Ht 64.0 in | Wt 158.6 lb

## 2021-07-05 DIAGNOSIS — M7062 Trochanteric bursitis, left hip: Secondary | ICD-10-CM | POA: Insufficient documentation

## 2021-07-05 DIAGNOSIS — M546 Pain in thoracic spine: Secondary | ICD-10-CM | POA: Diagnosis not present

## 2021-07-05 DIAGNOSIS — G894 Chronic pain syndrome: Secondary | ICD-10-CM

## 2021-07-05 DIAGNOSIS — Z79899 Other long term (current) drug therapy: Secondary | ICD-10-CM | POA: Diagnosis not present

## 2021-07-05 DIAGNOSIS — G20 Parkinson's disease: Secondary | ICD-10-CM

## 2021-07-05 DIAGNOSIS — M542 Cervicalgia: Secondary | ICD-10-CM

## 2021-07-05 DIAGNOSIS — Z760 Encounter for issue of repeat prescription: Secondary | ICD-10-CM | POA: Diagnosis not present

## 2021-07-05 DIAGNOSIS — Z79891 Long term (current) use of opiate analgesic: Secondary | ICD-10-CM | POA: Diagnosis not present

## 2021-07-05 DIAGNOSIS — M545 Low back pain, unspecified: Secondary | ICD-10-CM

## 2021-07-05 DIAGNOSIS — M7061 Trochanteric bursitis, right hip: Secondary | ICD-10-CM | POA: Diagnosis not present

## 2021-07-05 DIAGNOSIS — Y9289 Other specified places as the place of occurrence of the external cause: Secondary | ICD-10-CM | POA: Insufficient documentation

## 2021-07-05 DIAGNOSIS — M5412 Radiculopathy, cervical region: Secondary | ICD-10-CM

## 2021-07-05 DIAGNOSIS — M79642 Pain in left hand: Secondary | ICD-10-CM | POA: Insufficient documentation

## 2021-07-05 DIAGNOSIS — Y9301 Activity, walking, marching and hiking: Secondary | ICD-10-CM | POA: Diagnosis not present

## 2021-07-05 DIAGNOSIS — W010XXA Fall on same level from slipping, tripping and stumbling without subsequent striking against object, initial encounter: Secondary | ICD-10-CM | POA: Insufficient documentation

## 2021-07-05 DIAGNOSIS — G8929 Other chronic pain: Secondary | ICD-10-CM

## 2021-07-05 DIAGNOSIS — M792 Neuralgia and neuritis, unspecified: Secondary | ICD-10-CM | POA: Diagnosis present

## 2021-07-05 DIAGNOSIS — Z5181 Encounter for therapeutic drug level monitoring: Secondary | ICD-10-CM

## 2021-07-05 DIAGNOSIS — M25512 Pain in left shoulder: Secondary | ICD-10-CM | POA: Diagnosis not present

## 2021-07-05 DIAGNOSIS — W19XXXD Unspecified fall, subsequent encounter: Secondary | ICD-10-CM

## 2021-07-05 DIAGNOSIS — Y92009 Unspecified place in unspecified non-institutional (private) residence as the place of occurrence of the external cause: Secondary | ICD-10-CM

## 2021-07-05 MED ORDER — HYDROCODONE-ACETAMINOPHEN 7.5-325 MG PO TABS: 1.0000 | ORAL_TABLET | Freq: Three times a day (TID) | ORAL | 0 refills | Status: DC | PRN

## 2021-07-05 NOTE — Progress Notes (Signed)
Subjective:    Patient ID: Bianca Goodman, female    DOB: 1960-07-23, 61 y.o.   MRN: [id number]  HPI: Bianca Goodman is a 61 y.o. female who returns for follow up appointment for chronic pain and medication refill. She states her pain is located in her neck radiating into her left shoulder, left upper extremity pain, left hand pain with tingling and lower back pain She rates her pain 8. Her current exercise regime is walking and performing stretching exercises.  Bianca Goodman states a few days ago she was walking outside her apartment and slipped on the grass, she landed on her buttocks. She was able to pick herself up. She didn't seek medical attention.   Bianca Goodman Morphine equivalent is  22.50 MME.   Last UDS was Performed on 05/04/2021, it was consistent.     Pain Inventory Average Pain 8 Pain Right Now 8 My pain is constant, sharp, and burning  In the last 24 hours, has pain interfered with the following? General activity 7 Relation with others 7 Enjoyment of life 2 What TIME of day is your pain at its worst? morning  and daytime Sleep (in general) Poor  Pain is worse with: walking and sitting Pain improves with: rest, heat/ice, and medication Relief from Meds: 5  Family History  Problem Relation Age of Onset   Cancer Mother    Heart disease Father    Heart disease Brother    Social History   Socioeconomic History   Marital status: Widowed    Spouse name: Not on file   Number of children: 1   Years of education: Not on file   Highest education level: Not on file  Occupational History   Not on file  Tobacco Use   Smoking status: Former    Types: Cigarettes    Quit date: 09/20/2019    Years since quitting: 1.7   Smokeless tobacco: Never  Vaping Use   Vaping Use: Never used  Substance and Sexual Activity   Alcohol use: Not Currently   Drug use: Never   Sexual activity: Yes  Other Topics Concern   Not on file  Social History Narrative   Caffeine one cup daily, coffee  with milk.   Lives with son.  Widow.  Education HS.     Social Determinants of Health   Financial Resource Strain: Not on file  Food Insecurity: Not on file  Transportation Needs: Not on file  Physical Activity: Not on file  Stress: Not on file  Social Connections: Not on file   Past Surgical History:  Procedure Laterality Date   APPENDECTOMY     1997   CERVICAL SPINE SURGERY     C4-5 spinal fusion   RIGHT OOPHORECTOMY     scalenectomy     1985   Past Surgical History:  Procedure Laterality Date   APPENDECTOMY     1997   CERVICAL SPINE SURGERY     C4-5 spinal fusion   RIGHT OOPHORECTOMY     scalenectomy     1985   Past Medical History:  Diagnosis Date   Endometriosis    Hypertension    Parkinson's disease ([facility])    Thyroid disease    BP 120/81    Pulse 96    Temp 99.1 F (37.3 C)    Ht 5\' 4"  (1.626 m)    Wt 158 lb 9.6 oz (71.9 kg)    SpO2 100%    BMI 27.22 kg/m   Opioid Risk  Score:   Fall Risk Score:  `1  Depression screen PHQ 2/9  Depression screen [facility] 2/9 06/08/2021 05/04/2021 04/07/2021 03/07/2021 02/04/2021 12/28/2020 10/26/2020  Decreased Interest 0 2 3 3 1 1 1   Down, Depressed, Hopeless 1 2 3 3 1 1 1   PHQ - 2 Score 1 4 6 6 2 2 2   Altered sleeping - - - - - - 1  Tired, decreased energy - - - - - - -  Change in appetite - - - - - - -  Feeling bad or failure about yourself  - - - - - - -  Trouble concentrating - - - - - - -  Moving slowly or fidgety/restless - - - - - - -  Suicidal thoughts - - - - - - -  PHQ-9 Score - - - - - - 3  Difficult doing work/chores - - - - - - -     Review of Systems  Constitutional: Negative.   HENT: Negative.    Eyes: Negative.   Respiratory: Negative.    Cardiovascular: Negative.   Gastrointestinal: Negative.   Endocrine: Negative.   Genitourinary: Negative.   Musculoskeletal:  Positive for back pain.  Skin: Negative.   Allergic/Immunologic: Negative.   Neurological:  Positive for weakness.  Hematological: Negative.    Psychiatric/Behavioral:  Positive for dysphoric mood.       Objective:   Physical Exam Vitals and nursing note reviewed.  Constitutional:      Appearance: Normal appearance.  Cardiovascular:     Rate and Rhythm: Normal rate and regular rhythm.     Pulses: Normal pulses.     Heart sounds: Normal heart sounds.  Pulmonary:     Effort: Pulmonary effort is normal.     Breath sounds: Normal breath sounds.  Musculoskeletal:     Cervical back: Normal range of motion and neck supple.     Comments: Normal Muscle Bulk and Muscle Testing Reveals:  Upper Extremities: Right: Full ROM and Muscle Strength 5/5 Left Upper Extremity: Decreased ROM 90 Degrees and Muscle Strength 3/5 Left AC Joint Tenderness Thoracic Paraspinal Tenderness: T-7-T-9 Lumbar Paraspinal Tenderness: L-4-L-5 Bilateral Greater Trochanter Tenderness Lower Extremities: Decreased ROM and Muscle Strength 4/5 Bilateral Lower Extremities Flexion Produces Pain into her Bilateral Lower Extremities Arises From Table Slowly Narrow Based Gait     Skin:    General: Skin is warm and dry.  Neurological:     Mental Status: She is alert and oriented to person, place, and time.  Psychiatric:        Mood and Affect: Mood normal.        Behavior: Behavior normal.         Assessment & Plan:  1.Cervicalgia/ Cervical Radiculitis: Continue HEP as Tolerated. Continue Gabapentin 100 mg BID .Continue to monitor. 07/05/2021 Left Arm Radicular Pain: Continue Gabapentin . Continue to Monitor.07/05/2021 2. Chronic Pain Syndrome: Hydrocodone 7.5 mg/325 one tablet three times a day as needed for pain #90. We will continue the opioid monitoring program, this consists of regular clinic visits, examinations, urine drug screen, pill counts as well as use of [place] Controlled Substance Reporting system. A 12 month History has been reviewed on the [place] Controlled Substance Reporting System Today.   Continue current medication regimen.  Continue to monitor. 07/05/2021 3. Left Hand Pain: Ortho Following. Continue to monitor. 07/05/2021 4. Chronic Thoracic Pain: Continue current medication regimen. Continue to monitor. 07/05/2021 5. Bilateral Greater Trochanter Bursitis:  Continue alternate Ice and Heat  Therapy . Continue to monitor. 07/05/2021 6. Left Hand Tremor: Neurology Following. Continue to monitor. 07/05/2021  7. Fall at Home: Educated on [place] Prevention: She verbalizes understanding.   F/U in 1 month

## 2021-07-19 ENCOUNTER — Ambulatory Visit (INDEPENDENT_AMBULATORY_CARE_PROVIDER_SITE_OTHER): Payer: 59 | Admitting: Family Medicine

## 2021-07-19 ENCOUNTER — Other Ambulatory Visit: Payer: Self-pay

## 2021-07-19 VITALS — BP 116/76 | HR 82 | Temp 98.0°F | Ht 64.0 in | Wt 163.4 lb

## 2021-07-19 DIAGNOSIS — I10 Essential (primary) hypertension: Secondary | ICD-10-CM

## 2021-07-19 DIAGNOSIS — F321 Major depressive disorder, single episode, moderate: Secondary | ICD-10-CM

## 2021-07-19 DIAGNOSIS — Z8639 Personal history of other endocrine, nutritional and metabolic disease: Secondary | ICD-10-CM

## 2021-07-19 DIAGNOSIS — E039 Hypothyroidism, unspecified: Secondary | ICD-10-CM | POA: Diagnosis not present

## 2021-07-19 DIAGNOSIS — G20 Parkinson's disease: Secondary | ICD-10-CM

## 2021-07-19 DIAGNOSIS — K862 Cyst of pancreas: Secondary | ICD-10-CM

## 2021-07-19 NOTE — Progress Notes (Signed)
[facility] [address] [place] [address] Dept: [phone] Dept Fax: [phone]  Chronic Care Office Visit  Subjective:    Patient ID: Bianca Goodman, female    DOB: 1960/07/30, 61 y.o..   MRN: [id number]  Chief Complaint  Patient presents with   Follow-up    F/u meds,      History of Present Illness:  Patient is in today for reassessment of chronic medical issues.  Ms. Bradway has been diagnosed with Parkinson's disease. She is managed by Dr. Frances Furbish (neurology). Dr. Frances Furbish is titrating up on pramipexole and Ms. Hine is having a better response with her tremor. She does note some additional issues with micrographia and bradykinesia with walking.   At her last visit, Ms. Un opened up about the emotional toil her Parkinson's is having on her. She is concerned about progression and how it will impact her ability to care for herself. I started her on Prozac for depression and she seems to be doing better with this.  Ms. Schnackenberg continues to work with [facility] Maisie Fus) for her chronic pain management.    Ms. Alioto is on losartan, verapamil, and HCTZ for hypertension control.    Ms. Mcloughlin has a history of hypothyroidism. We decreased her levothyroxine dose twice this year due to a low TSH level. Her last value was in the normal range.  Ms. Pownall notes that when she lived in [place], she was found to have a tyroid nodule. She was told she would need ot have a follow-up for this.  Ms. 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 She was also told she should have this followed up. It has been more than 18 months since this was discovered.  Past Medical History: Patient Active Problem List   Diagnosis Date Noted   Parkinson disease (HCC) 05/03/2021   Depression, major, single episode, moderate (HCC) 05/03/2021   Tremor of left hand 02/01/2021    Atypical squamous cell changes of undetermined significance (ASCUS) on cervical cytology with negative high risk human papilloma virus (HPV) test result 11/09/2020   Essential hypertension 10/28/2020   Hypothyroidism 10/28/2020   History of endometriosis 10/28/2020   Prediabetes 10/28/2020   Radicular pain in left arm 09/14/2020   Past Surgical History:  Procedure Laterality Date   APPENDECTOMY     1997   CERVICAL SPINE SURGERY     C4-5 spinal fusion   RIGHT OOPHORECTOMY     scalenectomy     1985   Family History  Problem Relation Age of Onset   Cancer Mother    Heart disease Father    Heart disease Brother    Outpatient Medications Prior to Visit  Medication Sig Dispense Refill   FLUoxetine (PROZAC) 20 MG capsule Take 1 capsule (20 mg total) by mouth daily. 90 capsule 3   gabapentin (NEURONTIN) 100 MG capsule Take 1 capsule (100 mg total) by mouth 2 (two) times daily. 60 capsule 3   hydrochlorothiazide (MICROZIDE) 12.5 MG capsule Take 1 capsule (12.5 mg total) by mouth daily. 90 capsule 3   HYDROcodone-acetaminophen (NORCO) 7.5-325 MG tablet Take 1 tablet by mouth 3 (three) times daily as needed for moderate pain. 90 tablet 0   levothyroxine (SYNTHROID) 88 MCG tablet Take 1 tablet (88 mcg total) by mouth daily. 30 tablet 6   losartan (COZAAR) 50 MG tablet Take 1 tablet (50 mg total) by mouth daily. 90  tablet 3   pramipexole (MIRAPEX) 0.5 MG tablet Take 1.5 tablets (0.75 mg total) by mouth 3 (three) times daily. Follow titration instructions provided separately in writing. 135 tablet 5   verapamil (CALAN) 120 MG tablet Take 1 tablet (120 mg total) by mouth 2 (two) times daily. 180 tablet 3   No facility-administered medications prior to visit.   Allergies  Allergen Reactions   Cortizone-10 [Hydrocortisone] Rash   Tramadol Nausea Only     Objective:   Today's Vitals   07/19/21 1449  BP: 116/76  Pulse: 82  Temp: 98 F (36.7 C)  TempSrc: Temporal  SpO2: 98%  Weight:  163 lb 6.4 oz (74.1 kg)  Height: 5\' 4"  (1.626 m)   Body mass index is 28.05 kg/m.   General: Well developed, well nourished. No acute distress. Neuro: Resting tremor is only intermittently evident. Psych: Alert and oriented. Mood and affect are improved, though she is still occasionally tearful when talking about what the future may bring related to her Parkinson's.  Health Maintenance Due  Topic Date Due   Zoster Vaccines- Shingrix (1 of 2) Never done   COLONOSCOPY (Pts 45-47yrs Insurance coverage will need to be confirmed)  Never done   MAMMOGRAM  Never done   Lab Results Last thyroid functions Lab Results  Component Value Date   TSH 0.72 05/03/2021   Depression screen [facility] 2/9 07/19/2021 07/05/2021 06/08/2021  Decreased Interest 0 0 0  Down, Depressed, Hopeless 2 2 1   PHQ - 2 Score 2 2 1   Altered sleeping 3 - -  Tired, decreased energy 1 - -  Change in appetite 1 - -  Feeling bad or failure about yourself  1 - -  Trouble concentrating 0 - -  Moving slowly or fidgety/restless 0 - -  Suicidal thoughts 0 - -  PHQ-9 Score 8 - -  Difficult doing work/chores Somewhat difficult - -   Assessment & Plan:   1. Parkinson disease (HCC) On pramipexole. Continue to manage with Dr. 06/10/2021.  2. Depression, major, single episode, moderate (HCC) Improved on Prozac. We will continue her medication and reassess in 6 months.  3. Essential hypertension Blood pressure at goal. Continue HCTZ, losartan and verapamil.  4. Acquired hypothyroidism TSH at goal. Continue levothyroxine.  5. History of thyroid nodule I will order an ultrasound to follow this up.  - THYROID; Future  6. Pancreas cyst I will order a CT scan to follow this up.  - CT Abdomen Pelvis W Contrast; Future   Return in about 6 months (around 01/16/2022) for Reassessment.   Frances Furbish, MD

## 2021-07-25 ENCOUNTER — Other Ambulatory Visit: Payer: 59

## 2021-08-02 ENCOUNTER — Encounter: Payer: 59 | Attending: Registered Nurse | Admitting: Registered Nurse

## 2021-08-02 ENCOUNTER — Encounter: Payer: Self-pay | Admitting: Registered Nurse

## 2021-08-02 ENCOUNTER — Other Ambulatory Visit: Payer: Self-pay

## 2021-08-02 VITALS — BP 132/89 | HR 85 | Ht 64.0 in | Wt 159.0 lb

## 2021-08-02 DIAGNOSIS — M7062 Trochanteric bursitis, left hip: Secondary | ICD-10-CM

## 2021-08-02 DIAGNOSIS — G20 Parkinson's disease: Secondary | ICD-10-CM | POA: Diagnosis present

## 2021-08-02 DIAGNOSIS — Z79891 Long term (current) use of opiate analgesic: Secondary | ICD-10-CM | POA: Diagnosis present

## 2021-08-02 DIAGNOSIS — G8929 Other chronic pain: Secondary | ICD-10-CM | POA: Insufficient documentation

## 2021-08-02 DIAGNOSIS — Z5181 Encounter for therapeutic drug level monitoring: Secondary | ICD-10-CM

## 2021-08-02 DIAGNOSIS — M792 Neuralgia and neuritis, unspecified: Secondary | ICD-10-CM

## 2021-08-02 DIAGNOSIS — M542 Cervicalgia: Secondary | ICD-10-CM | POA: Diagnosis present

## 2021-08-02 DIAGNOSIS — M5412 Radiculopathy, cervical region: Secondary | ICD-10-CM

## 2021-08-02 DIAGNOSIS — M545 Low back pain, unspecified: Secondary | ICD-10-CM | POA: Diagnosis present

## 2021-08-02 DIAGNOSIS — M7061 Trochanteric bursitis, right hip: Secondary | ICD-10-CM | POA: Diagnosis present

## 2021-08-02 DIAGNOSIS — G894 Chronic pain syndrome: Secondary | ICD-10-CM | POA: Diagnosis present

## 2021-08-02 DIAGNOSIS — G20C Parkinsonism, unspecified: Secondary | ICD-10-CM

## 2021-08-02 MED ORDER — HYDROCODONE-ACETAMINOPHEN 7.5-325 MG PO TABS: 1.0000 | ORAL_TABLET | Freq: Four times a day (QID) | ORAL | 0 refills | Status: DC | PRN

## 2021-08-02 NOTE — Progress Notes (Signed)
? ?Subjective:  ? ? Patient ID: Bianca Goodman, female    DOB: [birth year], 61 y.o.   MRN: [id number] ? ?HPI: Bianca Goodman is a 61 y.o. female who returns for follow up appointment for chronic pain and medication refill. She states her pain is located in her neck radiating into her left shoulder and left hand. She also reports lower back pain and bilateral hip pain. Ms. Schoendorf states her pain has increased in intensity, only receiving 4 hours of relief with her current medication change. We will change her Hydrocodone to 4 times a day as needed for pain, she verbalizes understanding. She rates her pain 7. Her current exercise regime is walking and performing stretching exercises with bands. ? ?Ms. Ozburn Morphine equivalent is 22.50 MME.   Last UDS was Performed on 05/04/2021, it was consistent.  ?  ? ? ?Pain Inventory ?Average Pain 7 ?Pain Right Now 7 ?My pain is intermittent, constant, sharp, burning, and stabbing ? ?In the last 24 hours, has pain interfered with the following? ?General activity 8 ?Relation with others 7 ?Enjoyment of life 8 ?What TIME of day is your pain at its worst? morning  ?Sleep (in general) Poor ? ?Pain is worse with: walking and sitting ?Pain improves with: rest, heat/ice, and medication ?Relief from Meds: 7 ? ?Family History  ?Problem Relation Age of Onset  ? Cancer Mother   ? Heart disease Father   ? Heart disease Brother   ? ?Social History  ? ?Socioeconomic History  ? Marital status: Widowed  ?  Spouse name: Not on file  ? Number of children: 1  ? Years of education: Not on file  ? Highest education level: Not on file  ?Occupational History  ? Not on file  ?Tobacco Use  ? Smoking status: Former  ?  Types: Cigarettes  ?  Quit date: 09/20/2019  ?  Years since quitting: 1.8  ? Smokeless tobacco: Never  ?Vaping Use  ? Vaping Use: Never used  ?Substance and Sexual Activity  ? Alcohol use: Not Currently  ? Drug use: Never  ? Sexual activity: Yes  ?Other Topics Concern  ? Not on file  ?Social History  Narrative  ? Caffeine one cup daily, coffee with milk.   Lives with son.  Widow.  Education HS.    ? ?Social Determinants of Health  ? ?Financial Resource Strain: Not on file  ?Food Insecurity: Not on file  ?Transportation Needs: Not on file  ?Physical Activity: Not on file  ?Stress: Not on file  ?Social Connections: Not on file  ? ?Past Surgical History:  ?Procedure Laterality Date  ? APPENDECTOMY    ? 1997  ? CERVICAL SPINE SURGERY    ? C4-5 spinal fusion  ? RIGHT OOPHORECTOMY    ? scalenectomy    ? 1985  ? ?Past Surgical History:  ?Procedure Laterality Date  ? APPENDECTOMY    ? 1997  ? CERVICAL SPINE SURGERY    ? C4-5 spinal fusion  ? RIGHT OOPHORECTOMY    ? scalenectomy    ? 1985  ? ?Past Medical History:  ?Diagnosis Date  ? Endometriosis   ? Hypertension   ? Parkinson's disease (HCC)   ? Thyroid disease   ? ?There were no vitals taken for this visit. ? ?Opioid Risk Score:   ?Fall Risk Score:  `1 ? ?Depression screen PHQ 2/9 ? ?Depression screen [facility] 2/9 07/19/2021 07/05/2021 06/08/2021 05/04/2021 04/07/2021 03/07/2021 02/04/2021  ?Decreased Interest 0 0 0 2 3 3 1   ?  Down, Depressed, Hopeless 2 2 1 2 3 3 1   ?PHQ - 2 Score 2 2 1 4 6 6 2   ?Altered sleeping 3 - - - - - -  ?Tired, decreased energy 1 - - - - - -  ?Change in appetite 1 - - - - - -  ?Feeling bad or failure about yourself  1 - - - - - -  ?Trouble concentrating 0 - - - - - -  ?Moving slowly or fidgety/restless 0 - - - - - -  ?Suicidal thoughts 0 - - - - - -  ?PHQ-9 Score 8 - - - - - -  ?Difficult doing work/chores Somewhat difficult - - - - - -  ? ? ?Review of Systems  ?Musculoskeletal:  Positive for back pain and neck pain.  ?     Left arm pain, left wrist pain  ?All other systems reviewed and are negative. ? ?   ?Objective:  ? Physical Exam ?Vitals and nursing note reviewed.  ?Constitutional:   ?   Appearance: Normal appearance.  ?Cardiovascular:  ?   Rate and Rhythm: Normal rate and regular rhythm.  ?   Pulses: Normal pulses.  ?   Heart sounds: Normal heart  sounds.  ?Pulmonary:  ?   Effort: Pulmonary effort is normal.  ?   Breath sounds: Normal breath sounds.  ?Musculoskeletal:  ?   Cervical back: Normal range of motion and neck supple.  ?   Comments: Normal Muscle Bulk and Muscle Testing Reveals:  ?Upper Extremities: Right: Full ROM and Muscle Strength 4/5 ?Left Upper Extremity: Decreased ROM 45 Degrees and Muscle Strength  3/5 ?Left AC Joint Tenderness ?Lumbar Paraspinal Tenderness: L-4-L-5 ?Lower Extremities: Full ROM and Muscle Strength 5/5 ?Arises from Table Slowly ?Narrow Based  Gait  ?   ?Skin: ?   General: Skin is warm and dry.  ?Neurological:  ?   Mental Status: She is alert and oriented to person, place, and time.  ?Psychiatric:     ?   Mood and Affect: Mood normal.     ?   Behavior: Behavior normal.  ? ? ? ? ?   ?Assessment & Plan:  ?1.Cervicalgia/ Cervical Radiculitis: Continue HEP as Tolerated. Continue Gabapentin 100 mg BID .Continue to monitor. 08/02/2021 ?Left Arm Radicular Pain: Continue Gabapentin . Continue to Monitor.08/02/2021 ?2. Chronic Pain Syndrome: Hydrocodone 7.5 mg/325 one tablet 4  times a day as needed for pain #110. ?We will continue the opioid monitoring program, this consists of regular clinic visits, examinations, urine drug screen, pill counts as well as use of [place] Controlled Substance Reporting system. A 12 month History has been reviewed on the [place] Today.   Continue current medication regimen. Continue to monitor. 08/02/2021 ?3. Left Hand Pain: Ortho Following. Continue to monitor. 08/02/2021 ?4. Chronic Thoracic Pain: Continue current medication regimen. Continue to monitor. 08/02/2021 ?5. Bilateral Greater Trochanter Bursitis:  Continue alternate Ice and Heat Therapy . Continue to monitor. 08/02/2021 ?6. Left Hand Tremor: Neurology Following. Continue to monitor. 08/02/2021 ? 7. Fall at Home: No Falls this month. Educated on Falls Prevention: She verbalizes understanding.  08/02/2021 ?  ?F/U in 1 month  ?  ?  ? ?

## 2021-08-03 ENCOUNTER — Other Ambulatory Visit: Payer: 59

## 2021-08-24 ENCOUNTER — Encounter: Payer: 59 | Attending: Registered Nurse | Admitting: Registered Nurse

## 2021-08-24 VITALS — Ht 64.0 in | Wt 158.0 lb

## 2021-08-24 DIAGNOSIS — M542 Cervicalgia: Secondary | ICD-10-CM | POA: Diagnosis not present

## 2021-08-24 DIAGNOSIS — M546 Pain in thoracic spine: Secondary | ICD-10-CM

## 2021-08-24 DIAGNOSIS — M5412 Radiculopathy, cervical region: Secondary | ICD-10-CM | POA: Diagnosis not present

## 2021-08-24 DIAGNOSIS — M792 Neuralgia and neuritis, unspecified: Secondary | ICD-10-CM

## 2021-08-24 DIAGNOSIS — G8929 Other chronic pain: Secondary | ICD-10-CM

## 2021-08-24 DIAGNOSIS — Z79891 Long term (current) use of opiate analgesic: Secondary | ICD-10-CM

## 2021-08-24 DIAGNOSIS — M545 Low back pain, unspecified: Secondary | ICD-10-CM | POA: Diagnosis not present

## 2021-08-24 DIAGNOSIS — G894 Chronic pain syndrome: Secondary | ICD-10-CM

## 2021-08-24 DIAGNOSIS — Z5181 Encounter for therapeutic drug level monitoring: Secondary | ICD-10-CM

## 2021-08-24 MED ORDER — HYDROCODONE-ACETAMINOPHEN 7.5-325 MG PO TABS: 1.0000 | ORAL_TABLET | Freq: Four times a day (QID) | ORAL | 0 refills | Status: DC | PRN

## 2021-08-24 NOTE — Progress Notes (Addendum)
? ?Subjective:  ? ? Patient ID: Bianca Goodman, female    DOB: 07-13-1960, 61 y.o.   MRN: [id number] ? ?HPI: Bianca Goodman is a 61 y.o. female whose appointment was changed to telephone visit, due to lack of transportation. Bianca Goodman and I have  discussed the limitations of evaluation and management by telemedicine and the availability of in person appointments. The patient expressed understanding and agreed to proceed.  ?She  states her pain is located in her neck radiating into her left shoulder and mid- lower back pain. She rates her pain 6. Her current exercise regime is walking and performing stretching exercises. ? ?Bianca Goodman Morphine equivalent is 30.56 MME.   Last UDS was Performed on 05/04/2022, it was consistent.  ?  ?Pain Inventory ?Average Pain 7 ?Pain Right Now 6 ?My pain is constant, sharp, stabbing, and tingling ? ?In the last 24 hours, has pain interfered with the following? ?General activity 6 ?Relation with others 6 ?Enjoyment of life 6 ?What TIME of day is your pain at its worst? morning  ?Sleep (in general) Poor ? ?Pain is worse with: walking, sitting, inactivity, standing, and some activites ?Pain improves with: heat/ice, therapy/exercise, and medication ?Relief from Meds: 7 ? ?Family History  ?Problem Relation Age of Onset  ? Cancer Mother   ? Heart disease Father   ? Heart disease Brother   ? ?Social History  ? ?Socioeconomic History  ? Marital status: Widowed  ?  Spouse name: Not on file  ? Number of children: 1  ? Years of education: Not on file  ? Highest education level: Not on file  ?Occupational History  ? Not on file  ?Tobacco Use  ? Smoking status: Former  ?  Types: Cigarettes  ?  Quit date: 09/20/2019  ?  Years since quitting: 1.9  ? Smokeless tobacco: Never  ?Vaping Use  ? Vaping Use: Never used  ?Substance and Sexual Activity  ? Alcohol use: Not Currently  ? Drug use: Never  ? Sexual activity: Yes  ?Other Topics Concern  ? Not on file  ?Social History Narrative  ? Caffeine one cup daily,  coffee with milk.   Lives with son.  Widow.  Education HS.    ? ?Social Determinants of Health  ? ?Financial Resource Strain: Not on file  ?Food Insecurity: Not on file  ?Transportation Needs: Not on file  ?Physical Activity: Not on file  ?Stress: Not on file  ?Social Connections: Not on file  ? ?Past Surgical History:  ?Procedure Laterality Date  ? APPENDECTOMY    ? 1997  ? CERVICAL SPINE SURGERY    ? C4-5 spinal fusion  ? RIGHT OOPHORECTOMY    ? scalenectomy    ? 1985  ? ?Past Surgical History:  ?Procedure Laterality Date  ? APPENDECTOMY    ? 1997  ? CERVICAL SPINE SURGERY    ? C4-5 spinal fusion  ? RIGHT OOPHORECTOMY    ? scalenectomy    ? 1985  ? ?Past Medical History:  ?Diagnosis Date  ? Endometriosis   ? Hypertension   ? Parkinson's disease (HCC)   ? Thyroid disease   ? ?Ht 5\' 4"  (1.626 m) Comment: pt stated, virtual visit  Wt 158 lb (71.7 kg) Comment: pt stated, virtual visit  BMI 27.12 kg/m?  ? ?Opioid Risk Score:   ?Fall Risk Score:  `1 ? ?Depression screen PHQ 2/9 ? ? ?  08/24/2021  ? 10:40 AM 08/02/2021  ? 10:15 AM 07/19/2021  ?  2:55 PM 07/05/2021  ?  9:56 AM 06/08/2021  ? 10:25 AM 05/04/2021  ? 10:08 AM 04/07/2021  ?  9:18 AM  ?Depression screen PHQ 2/9  ?Decreased Interest 1 1 0 0 0 2 3  ?Down, Depressed, Hopeless 1 1 2 2 1 2 3   ?PHQ - 2 Score 2 2 2 2 1 4 6   ?Altered sleeping   3      ?Tired, decreased energy   1      ?Change in appetite   1      ?Feeling bad or failure about yourself    1      ?Trouble concentrating   0      ?Moving slowly or fidgety/restless   0      ?Suicidal thoughts   0      ?PHQ-9 Score   8      ?Difficult doing work/chores   Somewhat difficult      ?  ? ? ?Review of Systems  ?Musculoskeletal:  Positive for back pain and neck pain.  ?     Right leg pain  ?Neurological:   ?     Tingling  ?All other systems reviewed and are negative. ? ?   ?Objective:  ? Physical Exam ?Vitals and nursing note reviewed.  ?Constitutional:   ?   Appearance: Normal appearance.  ?Cardiovascular:  ?   Rate  and Rhythm: Normal rate and regular rhythm.  ?   Pulses: Normal pulses.  ?   Heart sounds: Normal heart sounds.  ?Pulmonary:  ?   Effort: Pulmonary effort is normal.  ?   Breath sounds: Normal breath sounds.  ?Musculoskeletal:  ?   Cervical back: Normal range of motion and neck supple.  ?   Comments: No Physical Exam Performed: Telephone Visit  ?Skin: ?   General: Skin is warm and dry.  ?Neurological:  ?   Mental Status: She is alert and oriented to person, place, and time.  ?Psychiatric:     ?   Mood and Affect: Mood normal.     ?   Behavior: Behavior normal.  ? ? ? ? ?   ?Assessment & Plan:  ?1.Cervicalgia/ Cervical Radiculitis: Continue HEP as Tolerated. Continue Gabapentin 100 mg BID .Continue to monitor. 08/24/2021 ?Left Arm Radicular Pain: Continue Gabapentin . Continue to Monitor.08/24/2021 ?2. Chronic Pain Syndrome: Hydrocodone 7.5 mg/325 one tablet 4  times a day as needed for pain #110. Second script sent for the following month.  ?We will continue the opioid monitoring program, this consists of regular clinic visits, examinations, urine drug screen, pill counts as well as use of 10[place]24/20[place] Controlled Substance Reporting system. A 12 month History has been reviewed on the 10/24/2021 Controlled Substance Reporting System Today.   Continue current medication regimen. Continue to monitor. 08/24/2021 ?3. Left Hand Pain: Ortho Following. Continue to monitor. 08/24/2021 ?4. Chronic Thoracic Pain: Continue current medication regimen. Continue to monitor. 08/24/2021 ?5. Bilateral Greater Trochanter Bursitis:  Continue alternate Ice and Heat Therapy . Continue to monitor. 08/24/2021 ?6. Left Hand Tremor: Neurology Following. Continue to monitor. 08/24/2021 ? ?  ?F/U in 1 month  ?Telephone Visit ?Established Patient ?Location of Patient: In her Home ?Location of Provider: In the Office ?Total Time Spent: 10 Minutes  ?  ? ? ? ? ? ? ? ?

## 2021-08-26 ENCOUNTER — Encounter: Payer: Self-pay | Admitting: Registered Nurse

## 2021-09-02 ENCOUNTER — Telehealth: Payer: Self-pay | Admitting: Family Medicine

## 2021-09-02 DIAGNOSIS — E039 Hypothyroidism, unspecified: Secondary | ICD-10-CM

## 2021-09-02 MED ORDER — LEVOTHYROXINE SODIUM 88 MCG PO TABS: 88.0000 ug | ORAL_TABLET | Freq: Every day | ORAL | 6 refills | Status: DC

## 2021-09-02 NOTE — Telephone Encounter (Signed)
Caller Name: Rikita Grabert ?Call back phone #: [phone] ? ?MEDICATION(S): levothyroxine (SYNTHROID) 88 MCG tablet ? ? ?Preferred Pharmacy: [facility] ? ?~~~Please advise patient/caregiver to allow 2-3 business days to process RX refills.  ? ? ?

## 2021-09-02 NOTE — Telephone Encounter (Signed)
RX sent to the pharmacy. Dm/cma  

## 2021-09-19 ENCOUNTER — Telehealth: Payer: Self-pay | Admitting: Family Medicine

## 2021-09-19 DIAGNOSIS — I10 Essential (primary) hypertension: Secondary | ICD-10-CM

## 2021-09-19 NOTE — Telephone Encounter (Signed)
Pt needs her losatin refilled. ?

## 2021-09-20 MED ORDER — LOSARTAN POTASSIUM 50 MG PO TABS: 50.0000 mg | ORAL_TABLET | Freq: Every day | ORAL | 1 refills | Status: DC

## 2021-09-21 ENCOUNTER — Ambulatory Visit: Payer: 59 | Admitting: Family Medicine

## 2021-09-22 ENCOUNTER — Encounter: Payer: Self-pay | Admitting: Registered Nurse

## 2021-09-22 ENCOUNTER — Encounter: Payer: 59 | Attending: Registered Nurse | Admitting: Registered Nurse

## 2021-09-22 VITALS — BP 130/89 | HR 98 | Ht 64.0 in | Wt 157.4 lb

## 2021-09-22 DIAGNOSIS — G20 Parkinson's disease: Secondary | ICD-10-CM | POA: Insufficient documentation

## 2021-09-22 DIAGNOSIS — G894 Chronic pain syndrome: Secondary | ICD-10-CM | POA: Diagnosis present

## 2021-09-22 DIAGNOSIS — M5412 Radiculopathy, cervical region: Secondary | ICD-10-CM | POA: Diagnosis present

## 2021-09-22 DIAGNOSIS — Z79891 Long term (current) use of opiate analgesic: Secondary | ICD-10-CM

## 2021-09-22 DIAGNOSIS — G20C Parkinsonism, unspecified: Secondary | ICD-10-CM

## 2021-09-22 DIAGNOSIS — M546 Pain in thoracic spine: Secondary | ICD-10-CM | POA: Diagnosis present

## 2021-09-22 DIAGNOSIS — M792 Neuralgia and neuritis, unspecified: Secondary | ICD-10-CM

## 2021-09-22 DIAGNOSIS — Z5181 Encounter for therapeutic drug level monitoring: Secondary | ICD-10-CM

## 2021-09-22 DIAGNOSIS — G8929 Other chronic pain: Secondary | ICD-10-CM | POA: Diagnosis present

## 2021-09-22 DIAGNOSIS — M545 Low back pain, unspecified: Secondary | ICD-10-CM | POA: Diagnosis present

## 2021-09-22 DIAGNOSIS — M7062 Trochanteric bursitis, left hip: Secondary | ICD-10-CM

## 2021-09-22 DIAGNOSIS — M542 Cervicalgia: Secondary | ICD-10-CM

## 2021-09-22 DIAGNOSIS — M7061 Trochanteric bursitis, right hip: Secondary | ICD-10-CM | POA: Diagnosis present

## 2021-09-22 MED ORDER — HYDROCODONE-ACETAMINOPHEN 7.5-325 MG PO TABS: 1.0000 | ORAL_TABLET | Freq: Four times a day (QID) | ORAL | 0 refills | Status: DC | PRN

## 2021-09-22 NOTE — Progress Notes (Signed)
? ?Subjective:  ? ? Patient ID: Bianca Goodman, female    DOB: 26-Sep-1960, 61 y.o.   MRN: [id number] ? ?HPI: Bianca Goodman is a 61 y.o. female who returns for follow up appointment for chronic pain and medication refill. She states her  pain is located in her neck radiating into her left shoulder, left arm, left ring and left pinky finger with tingling  and burning. Also reports . Mid- lower back pain and bilateral hip pain R>L. She  rates her pain 7. Her current exercise regime is walking and performing stretching exercises with bands ? ?Ms. Wares Morphine equivalent is 30.56 MME.   UDS ordered today.  ?  ? ?Pain Inventory ?Average Pain 7 ?Pain Right Now 7 ?My pain is constant, sharp, burning, and stabbing ? ?In the last 24 hours, has pain interfered with the following? ?General activity 7 ?Relation with others 7 ?Enjoyment of life 7 ?What TIME of day is your pain at its worst? morning  and daytime ?Sleep (in general) Poor ? ?Pain is worse with: walking and inactivity ?Pain improves with: rest, heat/ice, and medication ?Relief from Meds: 5 ? ?Family History  ?Problem Relation Age of Onset  ? Cancer Mother   ? Heart disease Father   ? Heart disease Brother   ? ?Social History  ? ?Socioeconomic History  ? Marital status: Widowed  ?  Spouse name: Not on file  ? Number of children: 1  ? Years of education: Not on file  ? Highest education level: Not on file  ?Occupational History  ? Not on file  ?Tobacco Use  ? Smoking status: Former  ?  Types: Cigarettes  ?  Quit date: 09/20/2019  ?  Years since quitting: 2.0  ? Smokeless tobacco: Never  ?Vaping Use  ? Vaping Use: Never used  ?Substance and Sexual Activity  ? Alcohol use: Not Currently  ? Drug use: Never  ? Sexual activity: Yes  ?Other Topics Concern  ? Not on file  ?Social History Narrative  ? Caffeine one cup daily, coffee with milk.   Lives with son.  Widow.  Education HS.    ? ?Social Determinants of Health  ? ?Financial Resource Strain: Not on file  ?Food Insecurity: Not  on file  ?Transportation Needs: Not on file  ?Physical Activity: Not on file  ?Stress: Not on file  ?Social Connections: Not on file  ? ?Past Surgical History:  ?Procedure Laterality Date  ? APPENDECTOMY    ? 1997  ? CERVICAL SPINE SURGERY    ? C4-5 spinal fusion  ? RIGHT OOPHORECTOMY    ? scalenectomy    ? 1985  ? ?Past Surgical History:  ?Procedure Laterality Date  ? APPENDECTOMY    ? 1997  ? CERVICAL SPINE SURGERY    ? C4-5 spinal fusion  ? RIGHT OOPHORECTOMY    ? scalenectomy    ? 1985  ? ?Past Medical History:  ?Diagnosis Date  ? Endometriosis   ? Hypertension   ? Parkinson's disease (HCC)   ? Thyroid disease   ? ?BP 130/89   Pulse 98   Ht 5\' 4"  (1.626 m)   Wt 157 lb 6.4 oz (71.4 kg)   SpO2 97%   BMI 27.02 kg/m?  ? ?Opioid Risk Score:   ?Fall Risk Score:  `1 ? ?Depression screen PHQ 2/9 ? ? ?  09/22/2021  ? 10:50 AM 08/24/2021  ? 10:40 AM 08/02/2021  ? 10:15 AM 07/19/2021  ?  2:55 PM 07/05/2021  ?  9:56 AM 06/08/2021  ? 10:25 AM 05/04/2021  ? 10:08 AM  ?Depression screen PHQ 2/9  ?Decreased Interest 3 1 1  0 0 0 2  ?Down, Depressed, Hopeless 3 1 1 2 2 1 2   ?PHQ - 2 Score 6 2 2 2 2 1 4   ?Altered sleeping    3     ?Tired, decreased energy    1     ?Change in appetite    1     ?Feeling bad or failure about yourself     1     ?Trouble concentrating    0     ?Moving slowly or fidgety/restless    0     ?Suicidal thoughts    0     ?PHQ-9 Score    8     ?Difficult doing work/chores    Somewhat difficult     ?  ? ?Review of Systems  ?Constitutional: Negative.   ?HENT: Negative.    ?Eyes: Negative.   ?Respiratory: Negative.    ?Cardiovascular: Negative.   ?Gastrointestinal:  Positive for abdominal pain.  ?Endocrine: Negative.   ?Genitourinary: Negative.   ?Musculoskeletal:  Positive for back pain and neck pain.  ?Skin: Negative.   ?Allergic/Immunologic: Negative.   ?Neurological:  Positive for tremors.  ?Hematological: Negative.   ?Psychiatric/Behavioral:  Positive for dysphoric mood and sleep disturbance.   ? ?    ?Objective:  ? Physical Exam ?Vitals and nursing note reviewed.  ?Constitutional:   ?   Appearance: Normal appearance.  ?Cardiovascular:  ?   Rate and Rhythm: Normal rate and regular rhythm.  ?   Pulses: Normal pulses.  ?   Heart sounds: Normal heart sounds.  ?Pulmonary:  ?   Effort: Pulmonary effort is normal.  ?   Breath sounds: Normal breath sounds.  ?Musculoskeletal:  ?   Cervical back: Normal range of motion and neck supple.  ?   Comments: Normal Muscle Bulk and Muscle Testing Reveals: ?Upper Extremities: Right: Full ROM and Muscle Strength 5/5 ?Left Upper Extremity: Decreased ROM 30 Degrees and Muscle Strength 3/5 ?Left AC Joint Tenderness ?Thoracic Paraspinal Tenderness: T-1-T-3 ?Lumbar Paraspinal Tenderness: L-4-L-5 ?Lower Extremities: Full ROM and Muscle Strength 5/5 ?Arises from Table slowly ?Narrow Based  Gait  ?   ?Skin: ?   General: Skin is warm and dry.  ?Neurological:  ?   Mental Status: She is alert and oriented to person, place, and time.  ?Psychiatric:     ?   Mood and Affect: Mood normal.     ?   Behavior: Behavior normal.  ? ? ? ? ?   ?Assessment & Plan:  ?1.Cervicalgia/ Cervical Radiculitis: Continue HEP as Tolerated. Continue Gabapentin 100 mg BID .Continue to monitor. 09/22/2021 ?Left Arm Radicular Pain: Continue Gabapentin . Continue to Monitor.09/22/2021 ?2. Chronic Pain Syndrome: Hydrocodone 7.5 mg/325 one tablet 4  times a day as needed for pain #110. ?We will continue the opioid monitoring program, this consists of regular clinic visits, examinations, urine drug screen, pill counts as well as use of [place] Controlled Substance Reporting system. A 12 month History has been reviewed on the [place] Controlled Substance Reporting System Today.   Continue current medication regimen. Continue to monitor. 09/22/2021 ?3. Left Hand Pain: Ortho Following. Continue to monitor. 09/22/2021 ?4. Chronic Thoracic Pain: Continue current medication regimen. Continue to monitor.  09/22/2021 ?5. Bilateral Greater Trochanter Bursitis:  Continue alternate Ice and Heat Therapy . Continue to monitor. 09/22/2021 ?6. Left Hand Tremor: Neurology Following.  Continue to monitor. 09/22/2021 ? 7. Fall at Home: No Falls this month. Educated on Falls Prevention: She verbalizes understanding. 09/22/2021 ?  ?F/U in 1 month  ?  ? ? ? ? ? ? ?

## 2021-09-26 ENCOUNTER — Encounter: Payer: Self-pay | Admitting: Neurology

## 2021-09-26 ENCOUNTER — Telehealth: Payer: Self-pay | Admitting: Neurology

## 2021-09-26 NOTE — Telephone Encounter (Signed)
LVM and sent cancellation letter asking pt to call back and r/s 6/14 appt- Dr. Frances Furbish out. ?

## 2021-09-29 ENCOUNTER — Telehealth: Payer: Self-pay | Admitting: *Deleted

## 2021-09-29 LAB — TOXASSURE SELECT,+ANTIDEPR,UR

## 2021-09-29 NOTE — Telephone Encounter (Signed)
Urine drug screen is negative for parent drug or metabolites. She reported she took med the night before the test, therefore metabolites present would be expected.Marland Kitchen ?

## 2021-10-20 ENCOUNTER — Encounter: Payer: Self-pay | Admitting: Registered Nurse

## 2021-10-20 ENCOUNTER — Telehealth: Payer: Self-pay | Admitting: Family Medicine

## 2021-10-20 ENCOUNTER — Telehealth: Payer: Self-pay | Admitting: Registered Nurse

## 2021-10-20 ENCOUNTER — Other Ambulatory Visit: Payer: Self-pay | Admitting: Family Medicine

## 2021-10-20 ENCOUNTER — Encounter: Payer: 59 | Attending: Registered Nurse | Admitting: Registered Nurse

## 2021-10-20 VITALS — BP 122/83 | HR 92 | Ht 64.0 in | Wt 157.6 lb

## 2021-10-20 DIAGNOSIS — M5412 Radiculopathy, cervical region: Secondary | ICD-10-CM | POA: Diagnosis present

## 2021-10-20 DIAGNOSIS — G8929 Other chronic pain: Secondary | ICD-10-CM | POA: Insufficient documentation

## 2021-10-20 DIAGNOSIS — M7061 Trochanteric bursitis, right hip: Secondary | ICD-10-CM | POA: Insufficient documentation

## 2021-10-20 DIAGNOSIS — Z5181 Encounter for therapeutic drug level monitoring: Secondary | ICD-10-CM | POA: Insufficient documentation

## 2021-10-20 DIAGNOSIS — M542 Cervicalgia: Secondary | ICD-10-CM | POA: Insufficient documentation

## 2021-10-20 DIAGNOSIS — Z79891 Long term (current) use of opiate analgesic: Secondary | ICD-10-CM | POA: Diagnosis present

## 2021-10-20 DIAGNOSIS — M7062 Trochanteric bursitis, left hip: Secondary | ICD-10-CM | POA: Diagnosis present

## 2021-10-20 DIAGNOSIS — M792 Neuralgia and neuritis, unspecified: Secondary | ICD-10-CM | POA: Diagnosis present

## 2021-10-20 DIAGNOSIS — G894 Chronic pain syndrome: Secondary | ICD-10-CM | POA: Insufficient documentation

## 2021-10-20 DIAGNOSIS — M546 Pain in thoracic spine: Secondary | ICD-10-CM | POA: Insufficient documentation

## 2021-10-20 DIAGNOSIS — I10 Essential (primary) hypertension: Secondary | ICD-10-CM

## 2021-10-20 MED ORDER — HYDROCODONE-ACETAMINOPHEN 7.5-325 MG PO TABS: 1.0000 | ORAL_TABLET | Freq: Four times a day (QID) | ORAL | 0 refills | Status: DC | PRN

## 2021-10-20 NOTE — Progress Notes (Signed)
Subjective:    Patient ID: Bianca Goodman, female    DOB: 1961-04-21, 61 y.o.   MRN: [id number]  HPI: Bianca Goodman is a 61 y.o. female who returns for follow up appointment for chronic pain and medication refill. She states her  pain is located in her neck rdiating into her left arm with tingling to her elbow, upper back and bilateral hip pain. She rates her pain 7. Her current exercise regime is walking and performing stretching exercises.  Bianca Goodman Morphine equivalent is 30.00  MME.   Last UDS was Performed on 09/22/2021, see note for details.     Pain Inventory Average Pain 7 Pain Right Now 7 My pain is intermittent, constant, sharp, and burning  In the last 24 hours, has pain interfered with the following? General activity 7 Relation with others 8 Enjoyment of life 7 What TIME of day is your pain at its worst? morning  and daytime Sleep (in general) Poor  Pain is worse with: walking and sitting Pain improves with: rest, heat/ice, and medication Relief from Meds: 7  Family History  Problem Relation Age of Onset   Cancer Mother    Heart disease Father    Heart disease Brother    Social History   Socioeconomic History   Marital status: Widowed    Spouse name: Not on file   Number of children: 1   Years of education: Not on file   Highest education level: Not on file  Occupational History   Not on file  Tobacco Use   Smoking status: Former    Types: Cigarettes    Quit date: 09/20/2019    Years since quitting: 2.0   Smokeless tobacco: Never  Vaping Use   Vaping Use: Never used  Substance and Sexual Activity   Alcohol use: Not Currently   Drug use: Never   Sexual activity: Yes  Other Topics Concern   Not on file  Social History Narrative   Caffeine one cup daily, coffee with milk.   Lives with son.  Widow.  Education HS.     Social Determinants of Health   Financial Resource Strain: Not on file  Food Insecurity: Not on file  Transportation Needs: Not on file   Physical Activity: Not on file  Stress: Not on file  Social Connections: Not on file   Past Surgical History:  Procedure Laterality Date   APPENDECTOMY     1997   CERVICAL SPINE SURGERY     C4-5 spinal fusion   RIGHT OOPHORECTOMY     scalenectomy     1985   Past Surgical History:  Procedure Laterality Date   APPENDECTOMY     1997   CERVICAL SPINE SURGERY     C4-5 spinal fusion   RIGHT OOPHORECTOMY     scalenectomy     1985   Past Medical History:  Diagnosis Date   Endometriosis    Hypertension    Parkinson's disease (HCC)    Thyroid disease    BP 122/83   Pulse 92   Ht 5\' 4"  (1.626 m)   Wt 157 lb 9.6 oz (71.5 kg)   SpO2 98%   BMI 27.05 kg/m   Opioid Risk Score:   Fall Risk Score:  `1  Depression screen [facility] 2/9     10/20/2021   10:26 AM 09/22/2021   10:50 AM 08/24/2021   10:40 AM 08/02/2021   10:15 AM 07/19/2021    2:55 PM 07/05/2021    9:56 AM  06/08/2021   10:25 AM  Depression screen PHQ 2/9  Decreased Interest 1 3 1 1  0 0 0  Down, Depressed, Hopeless 1 3 1 1 2 2 1   PHQ - 2 Score 2 6 2 2 2 2 1   Altered sleeping     3    Tired, decreased energy     1    Change in appetite     1    Feeling bad or failure about yourself      1    Trouble concentrating     0    Moving slowly or fidgety/restless     0    Suicidal thoughts     0    PHQ-9 Score     8    Difficult doing work/chores     Somewhat difficult       Review of Systems  Constitutional: Negative.   HENT: Negative.    Eyes: Negative.   Respiratory: Negative.    Cardiovascular: Negative.   Gastrointestinal: Negative.   Endocrine: Negative.   Genitourinary: Negative.   Musculoskeletal:  Positive for neck pain.  Skin: Negative.   Allergic/Immunologic: Negative.   Neurological: Negative.   Hematological: Negative.   Psychiatric/Behavioral:  Positive for dysphoric mood and sleep disturbance.       Objective:   Physical Exam Vitals and nursing note reviewed.  Constitutional:      Appearance:  Normal appearance.  Cardiovascular:     Rate and Rhythm: Normal rate and regular rhythm.     Pulses: Normal pulses.     Heart sounds: Normal heart sounds.  Pulmonary:     Effort: Pulmonary effort is normal.     Breath sounds: Normal breath sounds.  Musculoskeletal:     Cervical back: Normal range of motion and neck supple.     Comments: Normal Muscle Bulk and Muscle Testing Reveals:  Upper Extremities: Right: Full ROM and Muscle Strength 5/5 Left Upper Extremity: Decreased ROM 90 Degrees and Muscle Strength 3/5 Left AC Joint Tenderness Thoracic Hypersensitivity: T-1-T-7  Bilateral Greater Trochanter Tenderness  Lower Extremities: Full ROM and Muscle Strength 5/5 Rises from Table with ease Narrow Based Gait     Skin:    General: Skin is warm and dry.  Neurological:     Mental Status: She is alert and oriented to person, place, and time.  Psychiatric:        Mood and Affect: Mood normal.        Behavior: Behavior normal.         Assessment & Plan:  1.Cervicalgia/ Cervical Radiculitis: Continue HEP as Tolerated. Continue Gabapentin 100 mg BID .Continue to monitor. 10/20/2021 Left Arm Radicular Pain: Continue Gabapentin . Continue to Monitor.10/20/2021 2. Chronic Pain Syndrome: Hydrocodone 7.5 mg/325 one tablet 4  times a day as needed for pain #120. Second script sent to pharmacy to accommodate scheduled appointment. We will continue the opioid monitoring program, this consists of regular clinic visits, examinations, urine drug screen, pill counts as well as use of [place] Controlled Substance Reporting system. A 12 month History has been reviewed on the [place] Controlled Substance Reporting System Today.   Continue current medication regimen. Continue to monitor. 10/20/2021 3. Left Hand Pain: Ortho Following. Continue to monitor. 10/20/2021 4. Chronic Thoracic Pain: Continue current medication regimen. Continue to monitor. 10/20/2021 5. Bilateral Greater Trochanter  Bursitis:  Continue alternate Ice and Heat Therapy . Continue to monitor. 10/20/2021 6. Left Hand Tremor: Neurology Following. Continue to monitor. 10/20/2021  7. Fall at Home: No Falls this month. Educated on Falls Prevention: She verbalizes understanding. 10/20/2021   F/U in 1 month

## 2021-10-20 NOTE — Telephone Encounter (Signed)
Pt is requesting her BP med be refilled. Not sure which one it is. I see 2.

## 2021-10-20 NOTE — Telephone Encounter (Signed)
Patient notified VIA phone. Dm/cma  

## 2021-10-20 NOTE — Telephone Encounter (Signed)
Placed a call to Dr Veto Kemps office for [name], she stating she  states "she needed her blood pressure medication and water pill"  Message given to representative, asked her to send message to Dr Veto Kemps office, she verbalizes understanding.  Placed a call to  [name], regarding the above. She verbalizes understanding.

## 2021-11-02 ENCOUNTER — Ambulatory Visit: Payer: 59 | Admitting: Neurology

## 2021-12-01 ENCOUNTER — Encounter: Payer: 59 | Attending: Registered Nurse | Admitting: Registered Nurse

## 2021-12-01 ENCOUNTER — Encounter: Payer: Self-pay | Admitting: Registered Nurse

## 2021-12-01 VITALS — Ht 64.0 in | Wt 157.0 lb

## 2021-12-01 DIAGNOSIS — M5412 Radiculopathy, cervical region: Secondary | ICD-10-CM | POA: Diagnosis not present

## 2021-12-01 DIAGNOSIS — M792 Neuralgia and neuritis, unspecified: Secondary | ICD-10-CM | POA: Diagnosis not present

## 2021-12-01 DIAGNOSIS — M542 Cervicalgia: Secondary | ICD-10-CM

## 2021-12-01 DIAGNOSIS — G894 Chronic pain syndrome: Secondary | ICD-10-CM

## 2021-12-01 DIAGNOSIS — M546 Pain in thoracic spine: Secondary | ICD-10-CM

## 2021-12-01 DIAGNOSIS — G8929 Other chronic pain: Secondary | ICD-10-CM

## 2021-12-01 DIAGNOSIS — Z5181 Encounter for therapeutic drug level monitoring: Secondary | ICD-10-CM

## 2021-12-01 DIAGNOSIS — Z79891 Long term (current) use of opiate analgesic: Secondary | ICD-10-CM

## 2021-12-01 DIAGNOSIS — M545 Low back pain, unspecified: Secondary | ICD-10-CM

## 2021-12-01 DIAGNOSIS — M7061 Trochanteric bursitis, right hip: Secondary | ICD-10-CM

## 2021-12-01 DIAGNOSIS — M7062 Trochanteric bursitis, left hip: Secondary | ICD-10-CM

## 2021-12-01 MED ORDER — HYDROCODONE-ACETAMINOPHEN 7.5-325 MG PO TABS: 1.0000 | ORAL_TABLET | Freq: Four times a day (QID) | ORAL | 0 refills | Status: DC | PRN

## 2021-12-01 MED ORDER — GABAPENTIN 100 MG PO CAPS: 100.0000 mg | ORAL_CAPSULE | Freq: Every day | ORAL | 2 refills | Status: DC

## 2021-12-01 NOTE — Progress Notes (Deleted)
   Subjective:    Patient ID: Bianca Goodman, female    DOB: 03-Dec-1960, 61 y.o.   MRN: [id number]  HPI   .CPR Review of Systems     Objective:   Physical Exam        Assessment & Plan:

## 2021-12-01 NOTE — Progress Notes (Signed)
Subjective:    Patient ID: Bianca Goodman, female    DOB: [birth year], 61 y.o.   MRN: [id number]  HPI: Bianca Goodman is a 61 y.o. female whose appointment was changed to telephone visit, she called office reporting she has fever,chills and body aches, she will instructed to call her PCP to scheduled appointment. She verbalizes understanding. Bianca Goodman appointment was changed to telephone visit, we have  discussed the limitations of evaluation and management by telemedicine and the availability of in person appointments. The patient expressed understanding and agreed to procee She states her pain is located in her neck radiating into her left shoulder, left arm pain and mid= lower back pain. She rates her pain 7. Her current exercise regime is walking and performing stretching exercises.  Bianca Goodman Morphine equivalent is 30.00 MME.   Last  UDS was Performed on 09/22/2021, see note for details.      Pain Inventory Average Pain 7 Pain Right Now 7 My pain is constant, sharp, burning, dull, stabbing, tingling, and aching  In the last 24 hours, has pain interfered with the following? General activity 10 Relation with others 10 Enjoyment of life 10 What TIME of day is your pain at its worst? daytime Sleep (in general) Poor  Pain is worse with: walking, bending, sitting, standing, and some activites Pain improves with: rest, heat/ice, medication, and exercise Relief from Meds: 5  Family History  Problem Relation Age of Onset   Cancer Mother    Heart disease Father    Heart disease Brother    Social History   Socioeconomic History   Marital status: Widowed    Spouse name: Not on file   Number of children: 1   Years of education: Not on file   Highest education level: Not on file  Occupational History   Not on file  Tobacco Use   Smoking status: Former    Types: Cigarettes    Quit date: 09/20/2019    Years since quitting: 2.2   Smokeless tobacco: Never  Vaping Use   Vaping Use:  Never used  Substance and Sexual Activity   Alcohol use: Not Currently   Drug use: Never   Sexual activity: Yes  Other Topics Concern   Not on file  Social History Narrative   Caffeine one cup daily, coffee with milk.   Lives with son.  Widow.  Education HS.     Social Determinants of Health   Financial Resource Strain: Not on file  Food Insecurity: Not on file  Transportation Needs: Not on file  Physical Activity: Not on file  Stress: Not on file  Social Connections: Not on file   Past Surgical History:  Procedure Laterality Date   APPENDECTOMY     1997   CERVICAL SPINE SURGERY     C4-5 spinal fusion   RIGHT OOPHORECTOMY     scalenectomy     1985   Past Surgical History:  Procedure Laterality Date   APPENDECTOMY     1997   CERVICAL SPINE SURGERY     C4-5 spinal fusion   RIGHT OOPHORECTOMY     scalenectomy     1985   Past Medical History:  Diagnosis Date   Endometriosis    Hypertension    Parkinson's disease (HCC)    Thyroid disease    Ht 5\' 4"  (1.626 m)   Wt 157 lb (71.2 kg)   BMI 26.95 kg/m   Opioid Risk Score:   Fall Risk Score:  `1  Depression screen [facility] 2/9     10/20/2021   10:26 AM 09/22/2021   10:50 AM 08/24/2021   10:40 AM 08/02/2021   10:15 AM 07/19/2021    2:55 PM 07/05/2021    9:56 AM 06/08/2021   10:25 AM  Depression screen PHQ 2/9  Decreased Interest 1 3 1 1  0 0 0  Down, Depressed, Hopeless 1 3 1 1 2 2 1   PHQ - 2 Score 2 6 2 2 2 2 1   Altered sleeping     3    Tired, decreased energy     1    Change in appetite     1    Feeling bad or failure about yourself      1    Trouble concentrating     0    Moving slowly or fidgety/restless     0    Suicidal thoughts     0    PHQ-9 Score     8    Difficult doing work/chores     Somewhat difficult      Review of Systems  Musculoskeletal:  Positive for arthralgias and neck pain.       Pain in hands, left elbow, right knee pain  All other systems reviewed and are negative.      Objective:    Physical Exam Vitals and nursing note reviewed.  Constitutional:      Appearance: Normal appearance.  Cardiovascular:     Rate and Rhythm: Normal rate and regular rhythm.     Pulses: Normal pulses.     Heart sounds: Normal heart sounds.  Pulmonary:     Effort: Pulmonary effort is normal.     Breath sounds: Normal breath sounds.  Musculoskeletal:     Cervical back: Normal range of motion and neck supple.     Comments: No Physical Exam Perform : Telephone Visit  Skin:    General: Skin is warm and dry.  Neurological:     Mental Status: She is alert and oriented to person, place, and time.  Psychiatric:        Mood and Affect: Mood normal.        Behavior: Behavior normal.         Assessment & Plan:  1.Cervicalgia/ Cervical Radiculitis: Continue HEP as Tolerated. Continue Gabapentin 100 mg BID .Continue to monitor. 12/01/2021 Left Arm Radicular Pain: Continue Gabapentin . Continue to Monitor.12/01/2021 2. Chronic Pain Syndrome: Hydrocodone 7.5 mg/325 one tablet 4  times a day as needed for pain #120. Second script sent to pharmacy to accommodate scheduled appointment. We will continue the opioid monitoring program, this consists of regular clinic visits, examinations, urine drug screen, pill counts as well as use of Controlled Substance Reporting system. A 12 month History has been reviewed on the 12/03/2021 Controlled Substance Reporting System Today.   Continue current medication regimen. Continue to monitor. 12/01/2021 3. Left Hand Pain: Ortho Following. Continue to monitor. 12/01/2021 4. Chronic Thoracic Pain: Continue current medication regimen. Continue to monitor. 12/01/2021 5. Bilateral Greater Trochanter Bursitis:  Continue alternate Ice and Heat Therapy . Continue to monitor. 12/01/2021 6. Left Hand Tremor: Neurology Following. Continue to monitor. 12/01/2021  7. Fall at Home: No Falls this month. Educated on Falls Prevention: She verbalizes understanding.  12/01/2021  8. Left Shoulder Pain: Awaiting on Medicaid approval for X-ray, she states F/U in 1 month   Established Patient Telephone Call Location of Patient: In her Home Location of Provider: In the  Office Total Time Spent: 15 Minutes

## 2021-12-12 ENCOUNTER — Other Ambulatory Visit: Payer: Self-pay | Admitting: Neurology

## 2021-12-12 DIAGNOSIS — G20 Parkinson's disease: Secondary | ICD-10-CM

## 2021-12-29 ENCOUNTER — Telehealth: Payer: Self-pay

## 2021-12-29 ENCOUNTER — Telehealth: Payer: Self-pay | Admitting: Registered Nurse

## 2021-12-29 NOTE — Telephone Encounter (Signed)
ERROR

## 2021-12-29 NOTE — Telephone Encounter (Signed)
Bianca Goodman is needing a letter to support her Food Stamp application. She has requested the letter to state her medical condition (Parkinson and pain condition).   Patient call back phone [phone].

## 2021-12-29 NOTE — Telephone Encounter (Signed)
Okay to mail the letter to the confirmed home address in Epic.

## 2021-12-29 NOTE — Telephone Encounter (Signed)
Food Stamp letter: mailed to Bianca Goodman

## 2022-01-09 ENCOUNTER — Encounter: Payer: Self-pay | Admitting: Registered Nurse

## 2022-01-09 ENCOUNTER — Encounter: Payer: 59 | Attending: Registered Nurse | Admitting: Registered Nurse

## 2022-01-09 ENCOUNTER — Telehealth: Payer: Self-pay | Admitting: Family Medicine

## 2022-01-09 VITALS — Ht 64.0 in | Wt 152.0 lb

## 2022-01-09 DIAGNOSIS — M792 Neuralgia and neuritis, unspecified: Secondary | ICD-10-CM | POA: Insufficient documentation

## 2022-01-09 DIAGNOSIS — M545 Low back pain, unspecified: Secondary | ICD-10-CM | POA: Insufficient documentation

## 2022-01-09 DIAGNOSIS — M7061 Trochanteric bursitis, right hip: Secondary | ICD-10-CM | POA: Insufficient documentation

## 2022-01-09 DIAGNOSIS — Z5181 Encounter for therapeutic drug level monitoring: Secondary | ICD-10-CM | POA: Insufficient documentation

## 2022-01-09 DIAGNOSIS — M5412 Radiculopathy, cervical region: Secondary | ICD-10-CM | POA: Diagnosis not present

## 2022-01-09 DIAGNOSIS — G8929 Other chronic pain: Secondary | ICD-10-CM | POA: Insufficient documentation

## 2022-01-09 DIAGNOSIS — M542 Cervicalgia: Secondary | ICD-10-CM | POA: Insufficient documentation

## 2022-01-09 DIAGNOSIS — M546 Pain in thoracic spine: Secondary | ICD-10-CM | POA: Insufficient documentation

## 2022-01-09 DIAGNOSIS — G894 Chronic pain syndrome: Secondary | ICD-10-CM | POA: Insufficient documentation

## 2022-01-09 DIAGNOSIS — Z79891 Long term (current) use of opiate analgesic: Secondary | ICD-10-CM | POA: Insufficient documentation

## 2022-01-09 DIAGNOSIS — M7062 Trochanteric bursitis, left hip: Secondary | ICD-10-CM | POA: Insufficient documentation

## 2022-01-09 MED ORDER — HYDROCODONE-ACETAMINOPHEN 7.5-325 MG PO TABS: 1.0000 | ORAL_TABLET | Freq: Four times a day (QID) | ORAL | 0 refills | Status: DC | PRN

## 2022-01-09 NOTE — Telephone Encounter (Signed)
Pt want to know if she can change her appt for tomorrow to a my chart video because the person that was coming with her is not able to come

## 2022-01-09 NOTE — Progress Notes (Signed)
Subjective:    Patient ID: Bianca Goodman, female    DOB: 08/09/1960, 61 y.o.   MRN: [id number]  HPI: Bianca Goodman is a 61 y.o. female who is scheduled for telephone visit, she states her pain has increased in intensity and she was unable to come today for her scheduled  office visit. Ms. Forget and I have discussed the limitations of evaluation and management by telemedicine and the availability of in person appointments. The patient expressed understanding and agreed to proceed. She . states her pain is located in her neck radiating into her left shoulder, mid- lower back pain and bilateral hips. She rates her pain 7. Her current exercise regime is walking and performing stretching exercises.   Ms. Muldoon Morphine equivalent is 30.00 MME.   Last UDS was Performed on 09/22/2021, see note for details.    Pain Inventory Average Pain 7 Pain Right Now 7 My pain is constant, sharp, burning, stabbing, and aching  In the last 24 hours, has pain interfered with the following? General activity 4 Relation with others 4 Enjoyment of life 4 What TIME of day is your pain at its worst? morning  and varies Sleep (in general) Poor  Pain is worse with: walking, bending, sitting, standing, and some activites Pain improves with: heat/ice and medication Relief from Meds: 3  Family History  Problem Relation Age of Onset   Cancer Mother    Heart disease Father    Heart disease Brother    Social History   Socioeconomic History   Marital status: Widowed    Spouse name: Not on file   Number of children: 1   Years of education: Not on file   Highest education level: Not on file  Occupational History   Not on file  Tobacco Use   Smoking status: Former    Types: Cigarettes    Quit date: 09/20/2019    Years since quitting: 2.3   Smokeless tobacco: Never  Vaping Use   Vaping Use: Never used  Substance and Sexual Activity   Alcohol use: Not Currently   Drug use: Never   Sexual activity: Yes  Other  Topics Concern   Not on file  Social History Narrative   Caffeine one cup daily, coffee with milk.   Lives with son.  Widow.  Education HS.     Social Determinants of Health   Financial Resource Strain: Not on file  Food Insecurity: Not on file  Transportation Needs: Not on file  Physical Activity: Not on file  Stress: Not on file  Social Connections: Not on file   Past Surgical History:  Procedure Laterality Date   APPENDECTOMY     1997   CERVICAL SPINE SURGERY     C4-5 spinal fusion   RIGHT OOPHORECTOMY     scalenectomy     1985   Past Surgical History:  Procedure Laterality Date   APPENDECTOMY     1997   CERVICAL SPINE SURGERY     C4-5 spinal fusion   RIGHT OOPHORECTOMY     scalenectomy     1985   Past Medical History:  Diagnosis Date   Endometriosis    Hypertension    Parkinson's disease ([facility])    Thyroid disease    Ht 5\' 4"  (1.626 m)   Wt 152 lb (68.9 kg) Comment: pt reported  BMI 26.09 kg/m   Opioid Risk Score:   Fall Risk Score:  `1  Depression screen [facility] 2/9     01/09/2022  9:31 AM 12/01/2021    9:31 AM 10/20/2021   10:26 AM 09/22/2021   10:50 AM 08/24/2021   10:40 AM 08/02/2021   10:15 AM 07/19/2021    2:55 PM  Depression screen PHQ 2/9  Decreased Interest 0 1 1 3 1 1  0  Down, Depressed, Hopeless 0 1 1 3 1 1 2   PHQ - 2 Score 0 2 2 6 2 2 2   Altered sleeping       3  Tired, decreased energy       1  Change in appetite       1  Feeling bad or failure about yourself        1  Trouble concentrating       0  Moving slowly or fidgety/restless       0  Suicidal thoughts       0  PHQ-9 Score       8  Difficult doing work/chores       Somewhat difficult      Review of Systems  Musculoskeletal:  Positive for neck pain.       Left hand  All other systems reviewed and are negative.     Objective:   Physical Exam Vitals and nursing note reviewed.  Musculoskeletal:     Comments: No Physical Exam Performed: Telephone Visit         Assessment  & Plan:  1.Cervicalgia/ Cervical Radiculitis: Continue HEP as Tolerated. Continue Gabapentin 100 mg BID .Continue to monitor. 01/09/2022 Left Arm Radicular Pain: Continue Gabapentin . Continue to Monitor.01/09/2022 2. Chronic Pain Syndrome: Hydrocodone 7.5 mg/325 one tablet 4  times a day as needed for pain #120. Second script sent to pharmacy to accommodate scheduled appointment. We will continue the opioid monitoring program, this consists of regular clinic visits, examinations, urine drug screen, pill counts as well as use of Controlled Substance Reporting system. A 12 month History has been reviewed on the 01/11/2022 Controlled Substance Reporting System Today.   Continue current medication regimen. Continue to monitor. 01/09/2022 3. Left Hand Pain: Ortho Following. Continue to monitor. 01/09/2022 4. Chronic Thoracic Pain: Continue current medication regimen. Continue to monitor. 01/09/2022 5. Bilateral Greater Trochanter Bursitis:  Continue alternate Ice and Heat Therapy . Continue to monitor. 01/09/2022 6. Left Hand Tremor: Neurology Following. Continue to monitor. 01/09/2022  7. Fall at Home: No Falls this month. Educated on Falls Prevention: She verbalizes understanding. 01/09/2022  F/U in 1 month    Established Patient Telephone Call Location of Patient: In her Home Location of Provider: In the Office Total Time Spent: 10 Minutes

## 2022-01-10 ENCOUNTER — Ambulatory Visit: Payer: 59 | Admitting: Family Medicine

## 2022-01-12 ENCOUNTER — Other Ambulatory Visit: Payer: Self-pay | Admitting: Family Medicine

## 2022-01-12 DIAGNOSIS — I10 Essential (primary) hypertension: Secondary | ICD-10-CM

## 2022-01-24 ENCOUNTER — Telehealth: Payer: Self-pay | Admitting: *Deleted

## 2022-01-24 NOTE — Telephone Encounter (Signed)
Bianca Goodman asked that [facility] please call her.  She needs to discuss something with her.

## 2022-01-24 NOTE — Telephone Encounter (Signed)
Return Bianca Goodman call,  Unable to get through [name] line, also tried her son number as well.

## 2022-01-25 ENCOUNTER — Ambulatory Visit: Payer: Self-pay | Admitting: Family Medicine

## 2022-02-03 ENCOUNTER — Telehealth: Payer: Self-pay

## 2022-02-03 NOTE — Telephone Encounter (Signed)
Patient called to report that she had to take an extra pill for her pain. She stated the "pain was too strong."

## 2022-02-07 ENCOUNTER — Encounter: Payer: Self-pay | Admitting: Registered Nurse

## 2022-02-07 ENCOUNTER — Telehealth: Payer: Self-pay

## 2022-02-07 ENCOUNTER — Encounter: Payer: Medicaid Other | Attending: Registered Nurse | Admitting: Registered Nurse

## 2022-02-07 VITALS — BP 126/83 | HR 91 | Ht 64.0 in | Wt 157.0 lb

## 2022-02-07 DIAGNOSIS — M7062 Trochanteric bursitis, left hip: Secondary | ICD-10-CM | POA: Insufficient documentation

## 2022-02-07 DIAGNOSIS — M546 Pain in thoracic spine: Secondary | ICD-10-CM | POA: Diagnosis not present

## 2022-02-07 DIAGNOSIS — M7061 Trochanteric bursitis, right hip: Secondary | ICD-10-CM | POA: Diagnosis present

## 2022-02-07 DIAGNOSIS — M79642 Pain in left hand: Secondary | ICD-10-CM | POA: Diagnosis not present

## 2022-02-07 DIAGNOSIS — M792 Neuralgia and neuritis, unspecified: Secondary | ICD-10-CM | POA: Diagnosis not present

## 2022-02-07 DIAGNOSIS — R251 Tremor, unspecified: Secondary | ICD-10-CM

## 2022-02-07 DIAGNOSIS — M5412 Radiculopathy, cervical region: Secondary | ICD-10-CM | POA: Diagnosis not present

## 2022-02-07 DIAGNOSIS — G894 Chronic pain syndrome: Secondary | ICD-10-CM | POA: Insufficient documentation

## 2022-02-07 DIAGNOSIS — G20 Parkinson's disease: Secondary | ICD-10-CM | POA: Insufficient documentation

## 2022-02-07 DIAGNOSIS — M545 Low back pain, unspecified: Secondary | ICD-10-CM | POA: Insufficient documentation

## 2022-02-07 DIAGNOSIS — M542 Cervicalgia: Secondary | ICD-10-CM | POA: Insufficient documentation

## 2022-02-07 DIAGNOSIS — Z5181 Encounter for therapeutic drug level monitoring: Secondary | ICD-10-CM | POA: Insufficient documentation

## 2022-02-07 DIAGNOSIS — G8929 Other chronic pain: Secondary | ICD-10-CM | POA: Diagnosis present

## 2022-02-07 DIAGNOSIS — Z79891 Long term (current) use of opiate analgesic: Secondary | ICD-10-CM | POA: Diagnosis present

## 2022-02-07 MED ORDER — HYDROCODONE-ACETAMINOPHEN 7.5-325 MG PO TABS: 1.0000 | ORAL_TABLET | Freq: Every day | ORAL | 0 refills | Status: DC | PRN

## 2022-02-07 MED ORDER — GABAPENTIN 100 MG PO CAPS: 100.0000 mg | ORAL_CAPSULE | Freq: Every day | ORAL | 2 refills | Status: DC

## 2022-02-07 NOTE — Telephone Encounter (Signed)
PA for Hydrocodone sent to insurance through CoverMyMeds ?

## 2022-02-07 NOTE — Telephone Encounter (Signed)
Bianca Goodman was seen in office today.

## 2022-02-07 NOTE — Progress Notes (Signed)
Subjective:    Patient ID: Bianca Goodman, female    DOB: 1961-01-01, 61 y.o.   MRN: [id number]  HPI: Bianca Goodman is a 61 y.o. female who returns for follow up appointment for chronic pain and medication refill. She states her pain is located in her neck radiating into left arm, left index and left pinky finger with tingling and numbness. She also reports mid- lower back pain and bilateral hip pain. She rates her pain 8. Her current exercise regime is walking and performing stretching exercises.  Bianca Goodman Morphine equivalent is 30.00 MME.   Last UDS was Performed on 09/22/2021, see note for details.   Translator in room, all questions asked and answered.     Pain Inventory Average Pain 8 Pain Right Now 8 My pain is sharp and burning  In the last 24 hours, has pain interfered with the following? General activity 0 Relation with others 0 Enjoyment of life 0 What TIME of day is your pain at its worst? morning  and daytime Sleep (in general) Poor  Pain is worse with: walking and standing Pain improves with: rest, heat/ice, and medication Relief from Meds: 5  Family History  Problem Relation Age of Onset   Cancer Mother    Heart disease Father    Heart disease Brother    Social History   Socioeconomic History   Marital status: Widowed    Spouse name: Not on file   Number of children: 1   Years of education: Not on file   Highest education level: Not on file  Occupational History   Not on file  Tobacco Use   Smoking status: Former    Types: Cigarettes    Quit date: 09/20/2019    Years since quitting: 2.3   Smokeless tobacco: Never  Vaping Use   Vaping Use: Never used  Substance and Sexual Activity   Alcohol use: Not Currently   Drug use: Never   Sexual activity: Yes  Other Topics Concern   Not on file  Social History Narrative   Caffeine one cup daily, coffee with milk.   Lives with son.  Widow.  Education HS.     Social Determinants of Health   Financial Resource  Strain: Not on file  Food Insecurity: Not on file  Transportation Needs: Not on file  Physical Activity: Not on file  Stress: Not on file  Social Connections: Not on file   Past Surgical History:  Procedure Laterality Date   [place]     C4-5 spinal fusion   RIGHT OOPHORECTOMY     scalenectomy     1985   Past Surgical History:  Procedure Laterality Date   APPENDECTOMY     1997   CERVICAL SPINE SURGERY     C4-5 spinal fusion   RIGHT OOPHORECTOMY     scalenectomy     1985   Past Medical History:  Diagnosis Date   Endometriosis    Hypertension    Parkinson's disease ([place])    Thyroid disease    BP 126/83   Pulse 91   Ht 5\' 4"  (1.626 m)   Wt 157 lb (71.2 kg)   SpO2 95%   BMI 26.95 kg/m   Opioid Risk Score:   Fall Risk Score:  `1  Depression screen Bianca Goodman 2/9     01/09/2022    9:31 AM 12/01/2021    9:31 AM 10/20/2021   10:26 AM 09/22/2021  10:50 AM 08/24/2021   10:40 AM 08/02/2021   10:15 AM 07/19/2021    2:55 PM  Depression screen PHQ 2/9  Decreased Interest 0 1 1 3 1 1  0  Down, Depressed, Hopeless 0 1 1 3 1 1 2   PHQ - 2 Score 0 2 2 6 2 2 2   Altered sleeping       3  Tired, decreased energy       1  Change in appetite       1  Feeling bad or failure about yourself        1  Trouble concentrating       0  Moving slowly or fidgety/restless       0  Suicidal thoughts       0  PHQ-9 Score       8  Difficult doing work/chores       Somewhat difficult     Review of Systems  Constitutional: Negative.   HENT: Negative.    Eyes: Negative.   Respiratory: Negative.    Cardiovascular: Negative.   Gastrointestinal: Negative.   Endocrine: Negative.   Genitourinary: Negative.   Musculoskeletal:  Positive for back pain and neck pain.  Skin: Negative.   Allergic/Immunologic: Negative.   Neurological: Negative.   Hematological: Negative.   Psychiatric/Behavioral:  Positive for sleep disturbance.       Objective:   Physical  Exam Vitals and nursing note reviewed.  Constitutional:      Appearance: Normal appearance.  Cardiovascular:     Rate and Rhythm: Normal rate and regular rhythm.     Pulses: Normal pulses.     Heart sounds: Normal heart sounds.  Pulmonary:     Effort: Pulmonary effort is normal.     Breath sounds: Normal breath sounds.  Musculoskeletal:     Cervical back: Normal range of motion and neck supple.     Comments: Normal Muscle Bulk and Muscle Testing Reveals:  Upper Extremities:Decreased  ROM 45 Degrees and Muscle Strength  on Right 3/5 and Left 2/5 Thoracic Paraspinal Tenderness: T-7-T-9 Greater Trochanter Tenderness  Lower Extremities: Full ROM and Muscle Strength 5/5  Arises from Table Slowly Narrow Based  Gait     Skin:    General: Skin is warm and dry.  Neurological:     Mental Status: She is alert and oriented to person, place, and time.  Psychiatric:        Mood and Affect: Mood normal.        Behavior: Behavior normal.         Assessment & Plan:  1.Cervicalgia/ Cervical Radiculitis: Continue HEP as Tolerated. Continue Gabapentin 100 mg BID .Continue to monitor. 02/07/2022 Left Arm Radicular Pain: Continue Gabapentin . Continue to Monitor.02/07/2022 2. Chronic Pain Syndrome: Hydrocodone 7.5 mg/325 one tablet 4  times a day as needed for pain #120. We will continue the opioid monitoring program, this consists of regular clinic visits, examinations, urine drug screen, pill counts as well as use of Controlled Substance Reporting system. A 12 month History has been reviewed on the 02/09/2022 Controlled Substance Reporting System Today.   Continue current medication regimen. Continue to monitor. 02/07/2022 3. Left Hand Pain: Ortho Following. Continue to monitor. 02/07/2022 4. Chronic Thoracic Pain: Continue current medication regimen. Continue to monitor. 02/07/2022 5. Bilateral Greater Trochanter Bursitis:  Continue alternate Ice and Heat Therapy . Continue to  monitor. 02/07/2022 6. Left Hand Tremor: Neurology Following. Continue to monitor. 02/07/2022  7. Fall at  Home: No Falls this month. Educated on Falls Prevention: She verbalizes understanding. 02/07/2022   F/U in 1 month

## 2022-02-08 NOTE — Telephone Encounter (Signed)
Patient called back. She has been informed of the approval.

## 2022-02-08 NOTE — Telephone Encounter (Signed)
Approved. This drug has been approved. Approved quantity: 150 tablets per 30 day(s). You may fill up to a 34 day supply at a retail pharmacy. You may fill up to a 90 day supply for maintenance drugs, please refer to the formulary for details. Please call the pharmacy to process your prescription claim.

## 2022-02-14 ENCOUNTER — Encounter: Payer: Self-pay | Admitting: Neurology

## 2022-02-14 ENCOUNTER — Ambulatory Visit (INDEPENDENT_AMBULATORY_CARE_PROVIDER_SITE_OTHER): Payer: Medicaid Other | Admitting: Neurology

## 2022-02-14 VITALS — BP 123/84 | HR 85 | Ht 65.0 in | Wt 162.8 lb

## 2022-02-14 DIAGNOSIS — G20 Parkinson's disease: Secondary | ICD-10-CM | POA: Diagnosis not present

## 2022-02-14 DIAGNOSIS — F3289 Other specified depressive episodes: Secondary | ICD-10-CM | POA: Diagnosis not present

## 2022-02-14 MED ORDER — PRAMIPEXOLE DIHYDROCHLORIDE 1 MG PO TABS: 1.0000 mg | ORAL_TABLET | Freq: Three times a day (TID) | ORAL | 5 refills | Status: DC

## 2022-02-14 NOTE — Patient Instructions (Addendum)
It was nice to see you again.  I would like to increase your pramipexole to 1 mg strength, take 1 pill 3 times a day, at 7 AM, 2 PM, 7 PM daily.  Please make an appointment with Dr. Gena Fray, your primary care, to discuss an increase in your antidepressant medication.  Follow up to see one of our nurse practitioners in about 4 months.  Continue to exercise regularly and hydrate well with water.

## 2022-02-14 NOTE — Progress Notes (Signed)
Subjective:    Patient ID: Bianca Goodman is a 61 y.o. female.  HPI    Interim history:   Bianca Goodman is a 61 year old right-handed woman with an underlying medical history of chronic neck pain, on chronic narcotic pain medication, hypertension, hypothyroidism, and mildly overweight state, who presents for follow-up consultation of Bianca Goodman parkinsonism.  The patient is accompanied by Bianca Goodman, Bianca Goodman, today. I last saw Bianca Goodman on 06/29/2021, at which time she reported tolerating pramipexole and low-dose.  She was taking 0.375 mg 3 times daily.  She had noticed some benefit with regards to improvement in Bianca Goodman stiffness and fine motor control.  She was advised to increase Bianca Goodman pramipexole to 0.5 mg 3 times daily and eventually to 0.75 mg 3 times daily.  Today, 02/14/2022: She reports doing about the same, tolerating pramipexole at 1-1/2 pills 3 times a day, typically takes it at 7 AM, 2 PM when she has lunch and bedtime around 930 or 10 PM.  She also takes Bianca Goodman pain medication and Bianca Goodman gabapentin at the time.  She has had an increase in Bianca Goodman pain medication to 5 times a day.  She has noticed mouth dryness.  She has had some intermittent freezing spells and tries to exercise regularly by walking inside the home and also doing exercises with the elastic band and the squishy ball.  She lives with Bianca Goodman significant results, he is very supportive.  She does become tearful during the visit today.  She takes Prozac at 20 mg strength, she has been on the same dose for several months.  She tries to hydrate well with water and Gatorade.  She drinks flavored water as well and supplements with a protein shake as well.  She has not fallen but has come close at times, she had caught herself.  No issues swallowing, this has actually improved since Bianca Goodman last visit.  The patient's allergies, current medications, family history, past medical history, past social history, past surgical history and problem list were reviewed and updated  as appropriate.    Previously:   I first met Bianca Goodman at the request of Bianca Goodman pain management nurse practitioner on 02/21/2021, at which time she reported a several month history of a tremor affecting Bianca Goodman left upper extremity.  In addition, she had noticed stiffness and pain in Bianca Goodman hands, fine motor dyscontrol and overall slowness in Bianca Goodman movements.  She had moved from [place] recently.  She was noted to have parkinsonian features.  I asked Bianca Goodman to start a dopamine agonist, namely pramipexole in low-dose with gradual titration.   The patient recently saw Bianca Goodman pain management provider on 06/08/2021.  She has a follow-up this month.  She saw Bianca Goodman primary care in December on 05/03/2021 and reported a modest reduction in Bianca Goodman tremor in the left hand side. She was started on Prozac for depression.     02/21/21: (She) reports an approximately 39-month history of tremors affecting Bianca Goodman left upper extremity.  In addition, she has noticed stiffness and pain in Bianca Goodman hands, inability to fully extend or close Bianca Goodman hand, she has had slowness and changes in Bianca Goodman walk, she feels slower, sometimes she feels "stupid".  She moved from [place] some 7 or 8 months ago with Bianca Goodman son.  She lives with Bianca Goodman only son and Bianca Goodman sister.  She has a total of 1 sister and 3 brothers, none of whom have tremors, no family history of Parkinson's disease.  She fell about a month ago in the parking lot at the  doctor's office.  She hit Bianca Goodman lower back, she did not hit Bianca Goodman head or passed out thankfully.  She had fallen some years ago, she was still residing in [place] at the time, she was on [place] at the time.  She has chronic neck pain, she has had neck problems since 2005, she is status post ACDF in 2005.   She had a recent cervical spine MRI without contrast on 11/13/2020 and I reviewed the results: IMPRESSION: 1. Disc bulge with uncovertebral spurring at C6-7 with resultant moderate left C7 foraminal stenosis. This is suspected to be the symptomatic level. 2.  Degenerative disc bulge with uncovertebral spurring at C5-6 with resultant mild to moderate left and mild right C6 foraminal stenosis. 3. Broad-based disc bulge at C3-4 with resultant mild spinal stenosis. 4. Prior ACDF at C4-5 without residual or recurrent stenosis.   I reviewed your office note from 11/23/2020.    She is a non-smoker and does not drink alcohol, she limits Bianca Goodman caffeine to 1 cup of coffee in the morning.  She hydrates well with water.   She is widowed, she is not currently working.  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  She is always been independent.  She gets tearful towards the end of our visit as we discussed Bianca Goodman possible diagnosis.  She indicates that Bianca Goodman sister-in-law who is a physician in the [place] indicated to Bianca Goodman that she may have Parkinson's disease and she did not believe Bianca Goodman sister-in-law.  Bianca Goodman Past Medical History Is Significant For: Past Medical History:  Diagnosis Date   Endometriosis    Hypertension    Parkinson's disease ([place])    Thyroid disease     Bianca Goodman Past Surgical History Is Significant For: Past Surgical History:  Procedure Laterality Date   APPENDECTOMY     1997   CERVICAL SPINE SURGERY     C4-5 spinal fusion   RIGHT OOPHORECTOMY     scalenectomy     1985    Bianca Goodman Family History Is Significant For: Family History  Problem Relation Age of Onset   Cancer Mother    Heart disease Father    Heart disease Brother     Bianca Goodman Social History Is Significant For: Social History   Socioeconomic History   Marital status: Widowed    Spouse name: Not on file   Number of children: 1   Years of education: Not on file   Highest education level: Not on file  Occupational History   Not on file  Tobacco Use   Smoking status: Former    Types: Cigarettes    Quit date: 09/20/2019    Years since quitting: 2.4   Smokeless tobacco: Never  Vaping Use   Vaping Use: Never used  Substance and Sexual Activity   Alcohol use: Not Currently    Drug use: Never   Sexual activity: Yes  Other Topics Concern   Not on file  Social History Narrative   Caffeine one cup daily, coffee with milk.   Lives with son.  Widow.  Education HS.     Social Determinants of Health   Financial Resource Strain: Not on file  Food Insecurity: Not on file  Transportation Needs: Not on file  Physical Activity: Not on file  Stress: Not on file  Social Connections: Not on file    Bianca Goodman Allergies Are:  Allergies  Allergen Reactions   Cortizone-10 [Hydrocortisone] Rash   Tramadol Nausea Only  :   Bianca Goodman Current Medications Are:  Outpatient  Encounter Medications as of 02/14/2022  Medication Sig   FLUoxetine (PROZAC) 20 MG capsule Take 1 capsule (20 mg total) by mouth daily.   gabapentin (NEURONTIN) 100 MG capsule Take 1 capsule (100 mg total) by mouth at bedtime.   hydrochlorothiazide (MICROZIDE) 12.5 MG capsule Take 1 capsule by mouth once daily   HYDROcodone-acetaminophen (NORCO) 7.5-325 MG tablet Take 1 tablet by mouth 5 (five) times daily as needed for moderate pain.   levothyroxine (SYNTHROID) 88 MCG tablet Take 1 tablet (88 mcg total) by mouth daily.   losartan (COZAAR) 50 MG tablet Take 1 tablet (50 mg total) by mouth daily.   pramipexole (MIRAPEX) 0.5 MG tablet Take 1.5 tablets (0.75 mg total) by mouth 3 (three) times daily.   verapamil (CALAN) 120 MG tablet Take 1 tablet by mouth twice daily   No facility-administered encounter medications on file as of 02/14/2022.  :  Review of Systems:  Out of a complete 14 point review of systems, all are reviewed and negative with the exception of these symptoms as listed below:  Review of Systems  Neurological:        Feels like PD worsening.  No falls/ catches herself. Doing home exercises given to Bianca Goodman by therapist.  (Stopped PT due to pain so doing it at home).  Having more trouble with ADL's.  Some spasms ;uses hot and cold alternating. Lives with son.  (Is tearful, emotional).     Objective:   Neurological Exam  Physical Exam Physical Examination:   Vitals:   02/14/22 0948  BP: 123/84  Pulse: 85    General Examination: The patient is a very pleasant 60 y.o. female in no acute distress. She appears well-developed and well-nourished and well groomed.  Overall better spirits but does become tearful a couple of times during the visit.  HEENT: Normocephalic, atraumatic, pupils are equal, round and reactive to light, extraocular tracking is fairly well-preserved, face is symmetric with mild to moderate facial masking, mild nuchal rigidity, mild limitation to neck turns to both sides.  Hearing grossly intact.  She has no lip, neck or jaw tremor.  She has no significant hypophonia, no dysarthria or voice tremor.  Airway examination reveals mild mouth dryness, otherwise stable findings.  Tongue protrudes centrally and palate elevates symmetrically.     Chest: Clear to auscultation without wheezing, rhonchi or crackles noted.   Heart: S1+S2+0, regular and normal without murmurs, rubs or gallops noted.    Abdomen: Soft, non-tender and non-distended.   Extremities: There is no pitting edema in the distal lower extremities bilaterally.    Skin: Warm and dry without trophic changes noted.    Musculoskeletal: exam reveals no obvious joint deformities.     Neurologically:  Mental status: The patient is awake, alert and oriented in all 4 spheres. Bianca Goodman immediate and remote memory, attention, language skills and fund of knowledge are appropriate. There is no evidence of aphasia, agnosia, apraxia or anomia. Speech is clear with normal prosody and enunciation. Thought process is linear. Mood is normal and affect is normal.  Cranial nerves II - XII are as described above under HEENT exam.  Bianca Goodman right shoulder is mildly higher than left at baseline.     (On 02/21/2021: On Archimedes spiral drawing she was unable to do the task with the left hand, with the right hand there was no trembling.   Handwriting with the right hand was legible, not micrographic, not tremulous.)   Motor exam: Normal bulk, and strength is noted. She  has an increased tone in the left more than right upper extremity with mild cogwheeling noted.  She has fairly normal tone in the right lower extremity, perhaps mild increase in tone in the left lower extremity.  She has an intermittent mild resting tremor in the left upper extremity.  She has no significant postural or action tremor.  She does have difficulty extending Bianca Goodman fingers and reports stiffness and pain in the left hand.  Romberg is not tested due to safety concerns.  On fine motor testing, she has moderate difficulty with the left upper extremity, milder on the right.   Cerebellar testing: No dysmetria or intention tremor. There is no truncal or gait ataxia.  Sensory exam: intact to light touch in the upper and lower extremities.  Gait, station and balance: She stands stands without difficulty, posture is mildly stooped for age.  She has a tendency to flex Bianca Goodman left hand and arm while walking and standing.  She walks with slowness no telltale shuffling but significantly decreased arm swing on the left more than right, some start hesitation noted as well.  Balance is fairly well-preserved.  No walking aid.   Assessment and plan:    In summary, Bianca Goodman is a very pleasant 61 year old female with an underlying medical history of chronic neck pain, on chronic narcotic pain medication, hypertension, hypothyroidism, and mildly overweight state, who presents for follow-up consultation of Bianca Goodman parkinsonism with left-sided predominance.  Bianca Goodman symptoms date back to about 2 years ago or at least 1-1/2 years ago, she started noticing tremor and fine motor dyscontrol.  We started pramipexole in low-dose in late 2022 and gradually increased it to Bianca Goodman current dose of 0.75 mg 3 times daily.  She is advised to increase it further to 1 mg strength 1 pill 3 times daily for total of 3  mg daily.  She is advised to take it at 7 AM, 2 PM and 7 PM daily.  She is advised to continue to try to hydrate well and exercise regularly.  She has had some therapy as well.  She has had some residual depression, she is on the lower dose of Prozac, she tolerates that and is advised to make a follow-up appointment with Bianca Goodman primary care to discuss a possible increase in Bianca Goodman antidepressant medication or change if need be.  She is commended for trying to take good care of herself, exercising regularly, staying well-hydrated and taking Bianca Goodman medication on time.  She is advised to follow-up in our clinic to see one of our nurse practitioners in about 4 months, sooner if needed.  I answered all Bianca Goodman questions today and she was in agreement with our plan.  I provided a new prescription for pramipexole 1 mg strength 1 pill 3 times daily and also written instructions today. I spent 30 minutes in total face-to-face time and in reviewing records during pre-charting, more than 50% of which was spent in counseling and coordination of care, reviewing test results, reviewing medications and treatment regimen and/or in discussing or reviewing the diagnosis of Parkinson's disease, residual depression, the prognosis and treatment options. Pertinent laboratory and imaging test results that were available during this visit with the patient were reviewed by me and considered in my medical decision making (see chart for details).

## 2022-02-20 ENCOUNTER — Other Ambulatory Visit: Payer: Self-pay | Admitting: Family Medicine

## 2022-02-20 DIAGNOSIS — I10 Essential (primary) hypertension: Secondary | ICD-10-CM

## 2022-02-24 ENCOUNTER — Telehealth: Payer: Self-pay | Admitting: Family Medicine

## 2022-02-24 ENCOUNTER — Encounter: Payer: Self-pay | Admitting: Family Medicine

## 2022-02-24 ENCOUNTER — Ambulatory Visit (INDEPENDENT_AMBULATORY_CARE_PROVIDER_SITE_OTHER): Payer: Medicaid Other | Admitting: Family Medicine

## 2022-02-24 VITALS — BP 116/74 | HR 75 | Temp 97.5°F | Ht 65.0 in | Wt 160.6 lb

## 2022-02-24 DIAGNOSIS — F321 Major depressive disorder, single episode, moderate: Secondary | ICD-10-CM

## 2022-02-24 DIAGNOSIS — G20A2 Parkinson's disease without dyskinesia, with fluctuations: Secondary | ICD-10-CM | POA: Diagnosis not present

## 2022-02-24 DIAGNOSIS — E039 Hypothyroidism, unspecified: Secondary | ICD-10-CM

## 2022-02-24 DIAGNOSIS — H04123 Dry eye syndrome of bilateral lacrimal glands: Secondary | ICD-10-CM

## 2022-02-24 DIAGNOSIS — R7303 Prediabetes: Secondary | ICD-10-CM

## 2022-02-24 DIAGNOSIS — I10 Essential (primary) hypertension: Secondary | ICD-10-CM

## 2022-02-24 DIAGNOSIS — Z1231 Encounter for screening mammogram for malignant neoplasm of breast: Secondary | ICD-10-CM

## 2022-02-24 DIAGNOSIS — Z1211 Encounter for screening for malignant neoplasm of colon: Secondary | ICD-10-CM

## 2022-02-24 LAB — BASIC METABOLIC PANEL
BUN: 13 mg/dL (ref 6–23)
CO2: 27 mEq/L (ref 19–32)
Calcium: 9.2 mg/dL (ref 8.4–10.5)
Chloride: 104 mEq/L (ref 96–112)
Creatinine, Ser: 0.7 mg/dL (ref 0.40–1.20)
GFR: 94.91 mL/min (ref >60.00)
Glucose, Bld: 105 mg/dL — ABNORMAL HIGH (ref 70–99)
Potassium: 4 mEq/L (ref 3.5–5.1)
Sodium: 139 mEq/L (ref 135–145)

## 2022-02-24 LAB — TSH: TSH: 1.18 u[IU]/mL (ref 0.35–5.50)

## 2022-02-24 LAB — HEMOGLOBIN A1C: Hgb A1c MFr Bld: 6.5 % (ref 4.6–6.5)

## 2022-02-24 MED ORDER — LOSARTAN POTASSIUM 50 MG PO TABS: 50.0000 mg | ORAL_TABLET | Freq: Every day | ORAL | 3 refills | Status: DC

## 2022-02-24 MED ORDER — VERAPAMIL HCL 120 MG PO TABS: 120.0000 mg | ORAL_TABLET | Freq: Two times a day (BID) | ORAL | 3 refills | Status: DC

## 2022-02-24 MED ORDER — FLUOXETINE HCL 40 MG PO CAPS: 40.0000 mg | ORAL_CAPSULE | Freq: Every day | ORAL | 3 refills | Status: DC

## 2022-02-24 MED ORDER — ARTIFICIAL TEARS 0.5-0.6 % OP SOLN: 1.0000 [drp] | Freq: Three times a day (TID) | OPHTHALMIC | 5 refills | Status: AC | PRN

## 2022-02-24 MED ORDER — LEVOTHYROXINE SODIUM 88 MCG PO TABS: 88.0000 ug | ORAL_TABLET | Freq: Every day | ORAL | 3 refills | Status: DC

## 2022-02-24 MED ORDER — HYDROCHLOROTHIAZIDE 12.5 MG PO CAPS: 12.5000 mg | ORAL_CAPSULE | Freq: Every day | ORAL | 3 refills | Status: DC

## 2022-02-24 MED ORDER — HYPROMELLOSE (GONIOSCOPIC) 2.5 % OP SOLN: 1.0000 [drp] | Freq: Three times a day (TID) | OPHTHALMIC | 12 refills | Status: AC | PRN

## 2022-02-24 NOTE — Addendum Note (Signed)
Addended by: Haydee Salter on: 02/24/2022 05:09 PM   Modules accepted: Orders

## 2022-02-24 NOTE — Progress Notes (Signed)
[facility] [address] [place] [address] Dept: [phone] Dept Fax: [phone]  Chronic Care Office Visit  Subjective:    Patient ID: Bianca Goodman, female    DOB: September 14, 1960, 61 y.o..   MRN: [id number]  Chief Complaint  Patient presents with   Follow-up    F/u meds.  C/o dry/burning eyes x 1 month.  No OTC meds.      History of Present Illness:  Patient is in today for reassessment of chronic medical issues.  Ms. Spring has been diagnosed with Parkinson's disease. She is managed by Dr. Frances Furbish (neurology). She is currently managed on pramipexole 1 mg TID. She notes that despite this, she frequently feels the tremor, both externally and internally.   Ms. Vejar continues to struggle with depressive symptoms related to fears about her Parkinson's disease. She is currently managed on fluoxetine 20 mg daily. Her neurologist thought she might need a higher dose.   Ms. Mckeough continues to work with [facility] Maisie Fus) for her chronic pain management.    Ms. Lape has a history of hypertension. She is managed on losartan 50 mg daily, verapamil 120 mg bid, and HCTZ 12.5 mg daily.    Ms. Orrison has a history of hypothyroidism. She is managed on levothyroxine 88 mcg daily.  Ms. Swantek complains of dry, irritated eyes. She has been applying water to these to try and help, but her symptoms remain.   Past Medical History: Patient Active Problem List   Diagnosis Date Noted   Parkinson disease 05/03/2021   Depression, major, single episode, moderate (HCC) 05/03/2021   Tremor of left hand 02/01/2021   Atypical squamous cell changes of undetermined significance (ASCUS) on cervical cytology with negative high risk human papilloma virus (HPV) test result 11/09/2020   Essential hypertension 10/28/2020   Hypothyroidism 10/28/2020   History of endometriosis 10/28/2020   Prediabetes 10/28/2020    Radicular pain in left arm 09/14/2020   Past Surgical History:  Procedure Laterality Date   APPENDECTOMY     1997   CERVICAL SPINE SURGERY     C4-5 spinal fusion   RIGHT OOPHORECTOMY     scalenectomy     1985   Family History  Problem Relation Age of Onset   Cancer Mother    Heart disease Father    Heart disease Brother    Outpatient Medications Prior to Visit  Medication Sig Dispense Refill   gabapentin (NEURONTIN) 100 MG capsule Take 1 capsule (100 mg total) by mouth at bedtime. 30 capsule 2   HYDROcodone-acetaminophen (NORCO) 7.5-325 MG tablet Take 1 tablet by mouth 5 (five) times daily as needed for moderate pain. 150 tablet 0   pramipexole (MIRAPEX) 1 MG tablet Take 1 tablet (1 mg total) by mouth 3 (three) times daily. 90 tablet 5   FLUoxetine (PROZAC) 20 MG capsule Take 1 capsule (20 mg total) by mouth daily. 90 capsule 3   hydrochlorothiazide (MICROZIDE) 12.5 MG capsule Take 1 capsule by mouth once daily 90 capsule 0   levothyroxine (SYNTHROID) 88 MCG tablet Take 1 tablet (88 mcg total) by mouth daily. 30 tablet 6   losartan (COZAAR) 50 MG tablet Take 1 tablet (50 mg total) by mouth daily. 90 tablet 1   verapamil (CALAN) 120 MG tablet Take 1 tablet by mouth twice daily 180 tablet 0   No facility-administered medications prior to visit.   Allergies  Allergen Reactions   Cortizone-10 [Hydrocortisone] Rash  Tramadol Nausea Only   Objective:   Today's Vitals   02/24/22 0932  BP: 116/74  Pulse: 75  Temp: (!) 97.5 F (36.4 C)  TempSrc: Temporal  SpO2: 97%  Weight: 160 lb 9.6 oz (72.8 kg)  Height: 5\' 5"  (1.651 m)   Body mass index is 26.73 kg/m.   General: Well developed, well nourished. No acute distress. Eyes: Mild scaliness noted around lid margins. Neuro: Intermittent resting tremor noted, esp. int he left hand. Psych: Alert and oriented. Intermittent tearfulness.  Health Maintenance Due  Topic Date Due   Zoster Vaccines- Shingrix (1 of 2) Never done    COLONOSCOPY (Pts 45-61yrs Insurance coverage will need to be confirmed)  Never done   MAMMOGRAM  Never done     Assessment & Plan:   1. Essential hypertension Blood pressure is at goal. Continue losartan 50 mg daily, verapamil 120 mg bid, and HCTZ 12.5 mg daily. I will recheck renal function today.  - Basic metabolic panel - hydrochlorothiazide (MICROZIDE) 12.5 MG capsule; Take 1 capsule (12.5 mg total) by mouth daily.  Dispense: 90 capsule; Refill: 3 - losartan (COZAAR) 50 MG tablet; Take 1 tablet (50 mg total) by mouth daily.  Dispense: 90 tablet; Refill: 3 - verapamil (CALAN) 120 MG tablet; Take 1 tablet (120 mg total) by mouth 2 (two) times daily.  Dispense: 180 tablet; Refill: 3  2. Acquired hypothyroidism Due for follow-up TSH. Plan to continue levothyroxine 88 mcg daily for now.  - TSH - levothyroxine (SYNTHROID) 88 MCG tablet; Take 1 tablet (88 mcg total) by mouth daily.  Dispense: 90 tablet; Refill: 3  3. Parkinson's disease with fluctuating manifestations, unspecified whether dyskinesia present MS. Shampine will continue to follow with Dr. Rexene Alberts.  4. Prediabetes Due for repeat A1c to monitor. She did have a value last year at 6.5%, but this was lower on repeat.  - Basic metabolic panel - Hemoglobin A1c  5. Depression, major, single episode, moderate (HCC) Her depressive symptoms continue to be bothersome. I will increase her dose of fluoxetine and plan to reassess her in 6 weeks.  - FLUoxetine (PROZAC) 40 MG capsule; Take 1 capsule (40 mg total) by mouth daily.  Dispense: 90 capsule; Refill: 3  6. Bilateral dry eyes Ms. Rothe's complaint of dry eyes may relate to side effect of her pramipexole. As she does have some scaliness present, I recommend she try cleaning the lash line with Baby shampoo. I will prescribe some artificial tears to see if this will also improve her symptoms. I recommend she see an optometrist or ophthalmologist to further assess.  - Ambulatory referral  to Ophthalmology - hydroxypropyl methylcellulose / hypromellose (ISOPTO TEARS / GONIOVISC) 2.5 % ophthalmic solution; Place 1 drop into both eyes 3 (three) times daily as needed for dry eyes.  Dispense: 15 mL; Refill: 12  7. Encounter for screening mammogram for malignant neoplasm of breast  - MM DIGITAL SCREENING BILATERAL; Future  8. Screening for colon cancer  - Ambulatory referral to Gastroenterology  Return in about 6 weeks (around 04/07/2022) for Reassessment.   Haydee Salter, MD

## 2022-02-24 NOTE — Telephone Encounter (Signed)
Caller Name: [facility] Call back phone #: [phone]  Reason for Call: Pharmacy can not fill the eye drops. They only have the .5 available. Please send either to a new pharmacy or with updated dosage

## 2022-03-02 ENCOUNTER — Encounter: Payer: Self-pay | Admitting: Registered Nurse

## 2022-03-02 ENCOUNTER — Encounter: Payer: Medicaid Other | Attending: Registered Nurse | Admitting: Registered Nurse

## 2022-03-02 VITALS — BP 131/85 | HR 80 | Ht 65.0 in | Wt 157.0 lb

## 2022-03-02 DIAGNOSIS — M792 Neuralgia and neuritis, unspecified: Secondary | ICD-10-CM

## 2022-03-02 DIAGNOSIS — F4542 Pain disorder with related psychological factors: Secondary | ICD-10-CM | POA: Diagnosis not present

## 2022-03-02 DIAGNOSIS — R5381 Other malaise: Secondary | ICD-10-CM | POA: Diagnosis not present

## 2022-03-02 DIAGNOSIS — G8929 Other chronic pain: Secondary | ICD-10-CM | POA: Insufficient documentation

## 2022-03-02 DIAGNOSIS — M7061 Trochanteric bursitis, right hip: Secondary | ICD-10-CM

## 2022-03-02 DIAGNOSIS — Z5181 Encounter for therapeutic drug level monitoring: Secondary | ICD-10-CM | POA: Diagnosis not present

## 2022-03-02 DIAGNOSIS — M542 Cervicalgia: Secondary | ICD-10-CM

## 2022-03-02 DIAGNOSIS — Z79891 Long term (current) use of opiate analgesic: Secondary | ICD-10-CM

## 2022-03-02 DIAGNOSIS — M545 Low back pain, unspecified: Secondary | ICD-10-CM | POA: Diagnosis present

## 2022-03-02 DIAGNOSIS — M7062 Trochanteric bursitis, left hip: Secondary | ICD-10-CM

## 2022-03-02 DIAGNOSIS — G894 Chronic pain syndrome: Secondary | ICD-10-CM

## 2022-03-02 DIAGNOSIS — M5416 Radiculopathy, lumbar region: Secondary | ICD-10-CM

## 2022-03-02 DIAGNOSIS — M5412 Radiculopathy, cervical region: Secondary | ICD-10-CM | POA: Diagnosis not present

## 2022-03-02 DIAGNOSIS — G20C Parkinsonism, unspecified: Secondary | ICD-10-CM | POA: Diagnosis not present

## 2022-03-02 MED ORDER — HYDROCODONE-ACETAMINOPHEN 7.5-325 MG PO TABS: 1.0000 | ORAL_TABLET | Freq: Every day | ORAL | 0 refills | Status: DC | PRN

## 2022-03-02 NOTE — Progress Notes (Signed)
Subjective:    Patient ID: Bianca Goodman, female    DOB: [birth year], 61 y.o.   MRN: [id number]  HPI: Bianca Goodman is a 61 y.o. female who returns for follow up appointment for chronic pain and medication refill. She states her pain is located in her neck radiating into her left shoulder, left arm and left hand with tingling and numbness. She also reports mid- lower back pain radiating into her bilateral hips R>L.Marland Kitchen She rates her pain 7. Her current exercise regime is walking and performing stretching exercises.  Ms. Ballman Morphine equivalent is 37.50  MME.   UDS ordered today.    Pain Inventory Average Pain 7 Pain Right Now 7 My pain is sharp and burning  In the last 24 hours, has pain interfered with the following? General activity 8 Relation with others 8 Enjoyment of life 10 What TIME of day is your pain at its worst? morning  and daytime Sleep (in general) Poor  Pain is worse with: walking, standing, and some activites Pain improves with: rest, heat/ice, and medication Relief from Meds: 6  Family History  Problem Relation Age of Onset   Cancer Mother    Heart disease Father    Heart disease Brother    Social History   Socioeconomic History   Marital status: Widowed    Spouse name: Not on file   Number of children: 1   Years of education: Not on file   Highest education level: Not on file  Occupational History   Not on file  Tobacco Use   Smoking status: Former    Types: Cigarettes    Quit date: 09/20/2019    Years since quitting: 2.4   Smokeless tobacco: Never  Vaping Use   Vaping Use: Never used  Substance and Sexual Activity   Alcohol use: Not Currently   Drug use: Never   Sexual activity: Yes  Other Topics Concern   Not on file  Social History Narrative   Caffeine one cup daily, coffee with milk.   Lives with son.  Widow.  Education HS.     Social Determinants of Health   Financial Resource Strain: Not on file  Food Insecurity: Not on file   Transportation Needs: Not on file  Physical Activity: Not on file  Stress: Not on file  Social Connections: Not on file   Past Surgical History:  Procedure Laterality Date   [place]     C4-5 spinal fusion   RIGHT OOPHORECTOMY     scalenectomy     1985   Past Surgical History:  Procedure Laterality Date   [place]     C4-5 spinal fusion   RIGHT OOPHORECTOMY     scalenectomy     1985   Past Medical History:  Diagnosis Date   Endometriosis    Hypertension    Parkinson's disease    Thyroid disease    There were no vitals taken for this visit.  Opioid Risk Score:   Fall Risk Score:  `1  Depression screen [facility] 2/9     02/24/2022    9:40 AM 01/09/2022    9:31 AM 12/01/2021    9:31 AM 10/20/2021   10:26 AM 09/22/2021   10:50 AM 08/24/2021   10:40 AM 08/02/2021   10:15 AM  Depression screen PHQ 2/9  Decreased Interest 1 0 1 1 3 1 1   Down,  Depressed, Hopeless 2 0 1 1 3 1 1   PHQ - 2 Score 3 0 2 2 6 2 2   Altered sleeping 0        Tired, decreased energy 3        Change in appetite 0        Feeling bad or failure about yourself  0        Trouble concentrating 0        Moving slowly or fidgety/restless 1        Suicidal thoughts 0        PHQ-9 Score 7        Difficult doing work/chores Somewhat difficult          Review of Systems  Musculoskeletal:  Positive for back pain and neck pain.       Left elbow area  & left shoulder pain  All other systems reviewed and are negative.      Objective:   Physical Exam Vitals and nursing note reviewed.  Constitutional:      Appearance: Normal appearance.  Neck:     Comments: Cervical Paraspinal Tenderness: C-5-C-6 Mainly Left Side  Cardiovascular:     Rate and Rhythm: Normal rate and regular rhythm.     Pulses: Normal pulses.     Heart sounds: Normal heart sounds.  Pulmonary:     Effort: Pulmonary effort is normal.     Breath sounds: Normal  breath sounds.  Musculoskeletal:     Cervical back: Normal range of motion and neck supple.  Neurological:     Mental Status: She is alert.         Assessment & Plan:  1.Cervicalgia/ Cervical Radiculitis: Continue HEP as Tolerated. Continue Gabapentin 100 mg BID .Continue to monitor. 03/02/2022 Left Arm Radicular Pain: Continue Gabapentin . Continue to Monitor.03/02/2022 2. Chronic Pain Syndrome: Hydrocodone 7.5 mg/325 one tablet 4  times a day as needed for pain #120. We will continue the opioid monitoring program, this consists of regular clinic visits, examinations, urine drug screen, pill counts as well as use of 05/02/2022 Controlled Substance Reporting system. A 12 month History has been reviewed on the 05/02/2022 Controlled Substance Reporting System Today.   Continue current medication regimen. Continue to monitor. 03/02/2022 3. Left Hand Pain: Ortho Following. Continue to monitor. 03/02/2022 4. Chronic Thoracic Pain: Continue current medication regimen. Continue to monitor. 03/02/2022 5. Bilateral Greater Trochanter Bursitis:  Continue alternate Ice and Heat Therapy . Continue to monitor. 03/02/2022 6. Left Hand Tremor: Neurology Following. Continue to monitor. 03/02/2022  7. Fall at Home: No Falls this month. Educated on Falls Prevention: She verbalizes understanding. 03/02/2022   F/U in 1 month  g

## 2022-03-04 LAB — TOXASSURE SELECT,+ANTIDEPR,UR

## 2022-03-13 ENCOUNTER — Telehealth: Payer: Self-pay | Admitting: *Deleted

## 2022-03-13 NOTE — Telephone Encounter (Signed)
Urine drug screen for this encounter is consistent for prescribed medication 

## 2022-04-05 ENCOUNTER — Encounter: Payer: Medicaid Other | Attending: Registered Nurse | Admitting: Registered Nurse

## 2022-04-05 ENCOUNTER — Encounter: Payer: Self-pay | Admitting: Registered Nurse

## 2022-04-05 VITALS — Ht 65.0 in | Wt 152.0 lb

## 2022-04-05 DIAGNOSIS — M7062 Trochanteric bursitis, left hip: Secondary | ICD-10-CM

## 2022-04-05 DIAGNOSIS — M5412 Radiculopathy, cervical region: Secondary | ICD-10-CM | POA: Insufficient documentation

## 2022-04-05 DIAGNOSIS — M792 Neuralgia and neuritis, unspecified: Secondary | ICD-10-CM

## 2022-04-05 DIAGNOSIS — M546 Pain in thoracic spine: Secondary | ICD-10-CM

## 2022-04-05 DIAGNOSIS — G894 Chronic pain syndrome: Secondary | ICD-10-CM

## 2022-04-05 DIAGNOSIS — Z5181 Encounter for therapeutic drug level monitoring: Secondary | ICD-10-CM

## 2022-04-05 DIAGNOSIS — M542 Cervicalgia: Secondary | ICD-10-CM | POA: Diagnosis not present

## 2022-04-05 DIAGNOSIS — Z79891 Long term (current) use of opiate analgesic: Secondary | ICD-10-CM

## 2022-04-05 DIAGNOSIS — G8929 Other chronic pain: Secondary | ICD-10-CM

## 2022-04-05 DIAGNOSIS — M7061 Trochanteric bursitis, right hip: Secondary | ICD-10-CM

## 2022-04-05 MED ORDER — HYDROCODONE-ACETAMINOPHEN 7.5-325 MG PO TABS: 1.0000 | ORAL_TABLET | Freq: Every day | ORAL | 0 refills | Status: DC | PRN

## 2022-04-05 NOTE — Progress Notes (Signed)
Subjective:    Patient ID: Bianca Goodman, female    DOB: Sep 05, 1960, 61 y.o.   MRN: [id number]  HPI: Farron Lafond is a 61 y.o. female whose appointment was changed to a Telephone visit, she reports she has a fever, rhinorrhea and body aches. Her appointment was changed to telephone visit. We have  discussed the limitations of evaluation and management by telemedicine and the availability of in person appointments. The patient expressed understanding and agreed to proceed. Ms. Amason called her PCP awaiting on a return call she reports.   She states her pain is located in her neck radiating into left arm, left index finger and left pinky finger. She rates her pain 7. Her current exercise regime is walking.   Ms. Tellefsen Morphine equivalent is 37.50 MME.   Last UDS was Performed on 03/02/2022, it was consistent.     Pain Inventory Average Pain 8 Pain Right Now 7 My pain is constant, sharp, burning, and stabbing  In the last 24 hours, has pain interfered with the following? General activity 7 Relation with others 0 Enjoyment of life 0 What TIME of day is your pain at its worst? daytime Sleep (in general) Poor  Pain is worse with: walking, bending, sitting, standing, and some activites Pain improves with: rest, heat/ice, and medication Relief from Meds: 5  Family History  Problem Relation Age of Onset   Cancer Mother    Heart disease Father    Heart disease Brother    Social History   Socioeconomic History   Marital status: Widowed    Spouse name: Not on file   Number of children: 1   Years of education: Not on file   Highest education level: Not on file  Occupational History   Not on file  Tobacco Use   Smoking status: Former    Types: Cigarettes    Quit date: 09/20/2019    Years since quitting: 2.5   Smokeless tobacco: Never  Vaping Use   Vaping Use: Never used  Substance and Sexual Activity   Alcohol use: Not Currently   Drug use: Never   Sexual activity: Yes  Other  Topics Concern   Not on file  Social History Narrative   Caffeine one cup daily, coffee with milk.   Lives with son.  Widow.  Education HS.     Social Determinants of Health   Financial Resource Strain: Not on file  Food Insecurity: Not on file  Transportation Needs: Not on file  Physical Activity: Not on file  Stress: Not on file  Social Connections: Not on file   Past Surgical History:  Procedure Laterality Date   APPENDECTOMY     1997   CERVICAL SPINE SURGERY     C4-5 spinal fusion   RIGHT OOPHORECTOMY     scalenectomy     1985   Past Surgical History:  Procedure Laterality Date   APPENDECTOMY     1997   CERVICAL SPINE SURGERY     C4-5 spinal fusion   RIGHT OOPHORECTOMY     scalenectomy     1985   Past Medical History:  Diagnosis Date   Endometriosis    Hypertension    Parkinson's disease    Thyroid disease    Ht 5\' 5"  (1.651 m)   Wt 152 lb (68.9 kg)   BMI 25.29 kg/m   Opioid Risk Score:   Fall Risk Score:  `1  Depression screen [facility] 2/9     04/05/2022  9:45 AM 03/02/2022   10:13 AM 02/24/2022    9:40 AM 01/09/2022    9:31 AM 12/01/2021    9:31 AM 10/20/2021   10:26 AM 09/22/2021   10:50 AM  Depression screen PHQ 2/9  Decreased Interest 1 1 1  0 1 1 3   Down, Depressed, Hopeless 1 1 2  0 1 1 3   PHQ - 2 Score 2 2 3  0 2 2 6   Altered sleeping   0      Tired, decreased energy   3      Change in appetite   0      Feeling bad or failure about yourself    0      Trouble concentrating   0      Moving slowly or fidgety/restless   1      Suicidal thoughts   0      PHQ-9 Score   7      Difficult doing work/chores   Somewhat difficult        Review of Systems  Musculoskeletal:  Positive for arthralgias, back pain and neck pain.       Pain all over the body  Neurological:  Positive for headaches.  All other systems reviewed and are negative.      Objective:   Physical Exam Vitals and nursing note reviewed.  Musculoskeletal:     Comments: No Physical  Exam Performed: My- Chart Visit           Assessment & Plan:  1.Cervicalgia/ Cervical Radiculitis: Continue HEP as Tolerated. Continue Gabapentin 100 mg BID .Continue to monitor. 04/05/2022 Left Arm Radicular Pain: Continue Gabapentin . Continue to Monitor.04/05/2022 2. Chronic Pain Syndrome: Hydrocodone 7.5 mg/325 one tablet 5  times a day as needed for pain #150. We will continue the opioid monitoring program, this consists of regular clinic visits, examinations, urine drug screen, pill counts as well as use of Controlled Substance Reporting system. A 12 month History has been reviewed on the Controlled Substance Reporting System Today.   Continue current medication regimen. Continue to monitor. 04/05/2022 3. Left Hand Pain: Ortho Following. Continue to monitor. 04/05/2022 4. Chronic Thoracic Pain: Continue current medication regimen. Continue to monitor. 04/05/2022 5. Bilateral Greater Trochanter Bursitis:  Continue alternate Ice and Heat Therapy . Continue to monitor. 04/05/2022 6. Left Hand Tremor: Neurology Following. Continue to monitor. 04/05/2022  7. Fall at Home: No Falls this month. Educated on Falls Prevention: She verbalizes understanding. 04/05/2022   F/U in 1 month  Established Patient Location of Patient: In Her Home Location of Provider: In the Office Total Time :10 Minutes  g

## 2022-04-12 ENCOUNTER — Encounter: Payer: Self-pay | Admitting: Family Medicine

## 2022-04-19 ENCOUNTER — Ambulatory Visit: Payer: Medicaid Other | Admitting: Family Medicine

## 2022-04-19 ENCOUNTER — Ambulatory Visit: Payer: Medicaid Other | Admitting: Physical Therapy

## 2022-04-24 ENCOUNTER — Ambulatory Visit: Payer: Medicaid Other

## 2022-04-26 ENCOUNTER — Encounter: Payer: Medicaid Other | Admitting: Physical Therapy

## 2022-04-27 ENCOUNTER — Ambulatory Visit: Payer: Medicaid Other | Admitting: Physical Therapy

## 2022-05-01 ENCOUNTER — Telehealth: Payer: Self-pay

## 2022-05-01 NOTE — Telephone Encounter (Signed)
Patient called in stating that she was in extreme pain and she can barely lift her head up and she has had to take one extra pill Friday and may need to take an extra pill a day. I advised that she needs to take medication as prescribed but she stated she can't bare the pain and she needed that extra pill to help with the pain

## 2022-05-03 ENCOUNTER — Encounter: Payer: Medicaid Other | Admitting: Physical Therapy

## 2022-05-05 ENCOUNTER — Encounter: Payer: Self-pay | Admitting: Registered Nurse

## 2022-05-05 ENCOUNTER — Encounter: Payer: Medicaid Other | Attending: Registered Nurse | Admitting: Registered Nurse

## 2022-05-05 VITALS — BP 128/88 | HR 105 | Ht 65.0 in | Wt 159.6 lb

## 2022-05-05 DIAGNOSIS — M5416 Radiculopathy, lumbar region: Secondary | ICD-10-CM | POA: Diagnosis present

## 2022-05-05 DIAGNOSIS — M545 Low back pain, unspecified: Secondary | ICD-10-CM | POA: Diagnosis present

## 2022-05-05 DIAGNOSIS — M7062 Trochanteric bursitis, left hip: Secondary | ICD-10-CM | POA: Diagnosis present

## 2022-05-05 DIAGNOSIS — M7061 Trochanteric bursitis, right hip: Secondary | ICD-10-CM | POA: Diagnosis present

## 2022-05-05 DIAGNOSIS — M546 Pain in thoracic spine: Secondary | ICD-10-CM | POA: Diagnosis present

## 2022-05-05 DIAGNOSIS — M5412 Radiculopathy, cervical region: Secondary | ICD-10-CM | POA: Insufficient documentation

## 2022-05-05 DIAGNOSIS — M792 Neuralgia and neuritis, unspecified: Secondary | ICD-10-CM | POA: Diagnosis present

## 2022-05-05 DIAGNOSIS — G8929 Other chronic pain: Secondary | ICD-10-CM | POA: Insufficient documentation

## 2022-05-05 DIAGNOSIS — M79642 Pain in left hand: Secondary | ICD-10-CM | POA: Diagnosis not present

## 2022-05-05 DIAGNOSIS — M542 Cervicalgia: Secondary | ICD-10-CM | POA: Diagnosis present

## 2022-05-05 DIAGNOSIS — G894 Chronic pain syndrome: Secondary | ICD-10-CM | POA: Diagnosis not present

## 2022-05-05 DIAGNOSIS — R251 Tremor, unspecified: Secondary | ICD-10-CM

## 2022-05-05 MED ORDER — HYDROCODONE-ACETAMINOPHEN 10-325 MG PO TABS: 1.0000 | ORAL_TABLET | Freq: Four times a day (QID) | ORAL | 0 refills | Status: DC | PRN

## 2022-05-05 NOTE — Progress Notes (Unsigned)
Subjective:    Patient ID: Bianca Goodman, female    DOB: June 17, 1960, 61 y.o.   MRN: [id number]  HPI: Bryson Palen is a 61 y.o. female who returns for follow up appointment for chronic pain and medication refill. states *** pain is located in  ***. rates pain ***. current exercise regime is walking and performing stretching exercises.  Ms. Poche Morphine equivalent is *** MME.   Last UDS was Performed on 03/02/2022, it was consistent.    Pain Inventory Average Pain 8 Pain Right Now 8 My pain is sharp, burning, and stabbing  In the last 24 hours, has pain interfered with the following? General activity 9 Relation with others 7 Enjoyment of life 7 What TIME of day is your pain at its worst? varies Sleep (in general) Poor  Pain is worse with: walking, sitting, standing, and some activites Pain improves with: rest, heat/ice, and medication Relief from Meds: 7  Family History  Problem Relation Age of Onset   Cancer Mother    Heart disease Father    Heart disease Brother    Social History   Socioeconomic History   Marital status: Widowed    Spouse name: Not on file   Number of children: 1   Years of education: Not on file   Highest education level: Not on file  Occupational History   Not on file  Tobacco Use   Smoking status: Former    Types: Cigarettes    Quit date: 09/20/2019    Years since quitting: 2.6   Smokeless tobacco: Never  Vaping Use   Vaping Use: Never used  Substance and Sexual Activity   Alcohol use: Not Currently   Drug use: Never   Sexual activity: Yes  Other Topics Concern   Not on file  Social History Narrative   Caffeine one cup daily, coffee with milk.   Lives with son.  Widow.  Education HS.     Social Determinants of Health   Financial Resource Strain: Not on file  Food Insecurity: Not on file  Transportation Needs: Not on file  Physical Activity: Not on file  Stress: Not on file  Social Connections: Not on file   Past Surgical History:   Procedure Laterality Date   APPENDECTOMY     1997   CERVICAL SPINE SURGERY     C4-5 spinal fusion   RIGHT OOPHORECTOMY     scalenectomy     1985   Past Surgical History:  Procedure Laterality Date   APPENDECTOMY     1997   CERVICAL SPINE SURGERY     C4-5 spinal fusion   RIGHT OOPHORECTOMY     scalenectomy     1985   Past Medical History:  Diagnosis Date   Endometriosis    Hypertension    Parkinson's disease    Thyroid disease    BP 128/88   Pulse (!) 105   Ht 5\' 5"  (1.651 m)   Wt 159 lb 9.6 oz (72.4 kg)   SpO2 97%   BMI 26.56 kg/m   Opioid Risk Score:   Fall Risk Score:  `1  Depression screen [facility] 2/9     05/05/2022    9:53 AM 04/05/2022    9:45 AM 03/02/2022   10:13 AM 02/24/2022    9:40 AM 01/09/2022    9:31 AM 12/01/2021    9:31 AM 10/20/2021   10:26 AM  Depression screen PHQ 2/9  Decreased Interest 0 1 1 1  0 1 1  Down,  Depressed, Hopeless 0 1 1 2  0 1 1  PHQ - 2 Score 0 2 2 3  0 2 2  Altered sleeping    0     Tired, decreased energy    3     Change in appetite    0     Feeling bad or failure about yourself     0     Trouble concentrating    0     Moving slowly or fidgety/restless    1     Suicidal thoughts    0     PHQ-9 Score    7     Difficult doing work/chores    Somewhat difficult         Review of Systems  Musculoskeletal:  Positive for back pain and neck pain.  All other systems reviewed and are negative.     Objective:   Physical Exam        Assessment & Plan:   1.Cervicalgia/ Cervical Radiculitis: Continue HEP as Tolerated. Continue Gabapentin 100 mg BID .Continue to monitor. 04/05/2022 Left Arm Radicular Pain: Continue Gabapentin . Continue to Monitor.04/05/2022 2. Chronic Pain Syndrome: Hydrocodone 7.5 mg/325 one tablet 5  times a day as needed for pain #150. We will continue the opioid monitoring program, this consists of regular clinic visits, examinations, urine drug screen, pill counts as well as use of 04/07/2022  Controlled Substance Reporting system. A 12 month History has been reviewed on the 04/07/2022 Controlled Substance Reporting System Today.   Continue current medication regimen. Continue to monitor. 04/05/2022 3. Left Hand Pain: Ortho Following. Continue to monitor. 04/05/2022 4. Chronic Thoracic Pain: Continue current medication regimen. Continue to monitor. 04/05/2022 5. Bilateral Greater Trochanter Bursitis:  Continue alternate Ice and Heat Therapy . Continue to monitor. 04/05/2022 6. Left Hand Tremor: Neurology Following. Continue to monitor. 04/05/2022  7. Fall at Home: No Falls this month. Educated on Falls Prevention: She verbalizes understanding. 04/05/2022   F/U in 1 month

## 2022-05-07 ENCOUNTER — Encounter: Payer: Self-pay | Admitting: Registered Nurse

## 2022-05-11 ENCOUNTER — Encounter: Payer: Medicaid Other | Admitting: Physical Therapy

## 2022-05-23 ENCOUNTER — Encounter: Payer: Medicaid Other | Admitting: Physical Therapy

## 2022-05-29 ENCOUNTER — Telehealth: Payer: Self-pay | Admitting: Registered Nurse

## 2022-05-29 ENCOUNTER — Other Ambulatory Visit: Payer: Self-pay | Admitting: Registered Nurse

## 2022-05-29 MED ORDER — HYDROCODONE-ACETAMINOPHEN 10-325 MG PO TABS: 1.0000 | ORAL_TABLET | Freq: Four times a day (QID) | ORAL | 0 refills | Status: DC | PRN

## 2022-05-29 NOTE — Telephone Encounter (Signed)
Note was Reviewed.  Gabapentin refill sent to pharmacy.

## 2022-05-29 NOTE — Telephone Encounter (Signed)
PMP was Reviewed. Hydrocodone e-scribed today. [name] appointment was changed due to illness.

## 2022-05-29 NOTE — Telephone Encounter (Signed)
She is scheduled for 06/15/21 She needs medication on 06/04/2022

## 2022-05-29 NOTE — Addendum Note (Signed)
Addended by: Bayard Hugger on: 05/29/2022 04:25 PM   Modules accepted: Orders

## 2022-05-29 NOTE — Telephone Encounter (Signed)
Patient is not feelin well. She has a bad cough and fever. She wants to know if you will do phone visit for 06/01/22.

## 2022-06-01 ENCOUNTER — Encounter: Payer: Medicaid Other | Admitting: Registered Nurse

## 2022-06-02 ENCOUNTER — Telehealth: Payer: Self-pay | Admitting: Registered Nurse

## 2022-06-02 MED ORDER — HYDROCODONE-ACETAMINOPHEN 10-325 MG PO TABS: 1.0000 | ORAL_TABLET | Freq: Four times a day (QID) | ORAL | 0 refills | Status: DC | PRN

## 2022-06-02 NOTE — Telephone Encounter (Signed)
Return Bianca Goodman call, she stated  [facility] doesn't have the Hydrocodone in stock.  Call placed to [facility], they were asked to  remover her prescription. Hydrocodone e- scribed to [facility].  Call placed to Bianca Goodman regarding the above, she was given [facility] number, she verbalizes understanding.

## 2022-06-02 NOTE — Telephone Encounter (Signed)
Patient called to let Zella Ball know that her pharmacy doesn't have her hydrocodone in stock.  Patient needs medication or something else.  Please call patient.

## 2022-06-15 ENCOUNTER — Encounter: Payer: Medicaid Other | Attending: Registered Nurse | Admitting: Registered Nurse

## 2022-06-15 ENCOUNTER — Other Ambulatory Visit: Payer: Self-pay | Admitting: Family Medicine

## 2022-06-15 ENCOUNTER — Encounter: Payer: Self-pay | Admitting: Registered Nurse

## 2022-06-15 VITALS — BP 92/65 | HR 85 | Ht 65.0 in | Wt 160.0 lb

## 2022-06-15 DIAGNOSIS — M5412 Radiculopathy, cervical region: Secondary | ICD-10-CM | POA: Diagnosis present

## 2022-06-15 DIAGNOSIS — G894 Chronic pain syndrome: Secondary | ICD-10-CM | POA: Diagnosis not present

## 2022-06-15 DIAGNOSIS — Z5181 Encounter for therapeutic drug level monitoring: Secondary | ICD-10-CM | POA: Diagnosis not present

## 2022-06-15 DIAGNOSIS — M546 Pain in thoracic spine: Secondary | ICD-10-CM | POA: Diagnosis not present

## 2022-06-15 DIAGNOSIS — G8929 Other chronic pain: Secondary | ICD-10-CM

## 2022-06-15 DIAGNOSIS — Z79891 Long term (current) use of opiate analgesic: Secondary | ICD-10-CM

## 2022-06-15 DIAGNOSIS — I10 Essential (primary) hypertension: Secondary | ICD-10-CM

## 2022-06-15 DIAGNOSIS — M7061 Trochanteric bursitis, right hip: Secondary | ICD-10-CM | POA: Diagnosis not present

## 2022-06-15 DIAGNOSIS — M792 Neuralgia and neuritis, unspecified: Secondary | ICD-10-CM

## 2022-06-15 DIAGNOSIS — M7062 Trochanteric bursitis, left hip: Secondary | ICD-10-CM

## 2022-06-15 DIAGNOSIS — M542 Cervicalgia: Secondary | ICD-10-CM

## 2022-06-15 DIAGNOSIS — I951 Orthostatic hypotension: Secondary | ICD-10-CM

## 2022-06-15 MED ORDER — HYDROCODONE-ACETAMINOPHEN 10-325 MG PO TABS: 1.0000 | ORAL_TABLET | Freq: Four times a day (QID) | ORAL | 0 refills | Status: DC | PRN

## 2022-06-15 NOTE — Progress Notes (Signed)
Received report of hypotension issue. Bianca Goodman is treated with HCTZ 12.5 mg daily, losartan 50 mg daily, and verapamil 120 mg daily. It would be helpful for her to monitor this at home to help determine if we need to adjust her medication.

## 2022-06-15 NOTE — Progress Notes (Signed)
Subjective:    Patient ID: Bianca Goodman, female    DOB: [birth year], 62 y.o.   MRN: [id number]  HPI: Bianca Goodman is a 62 y.o. female who returns for follow up appointment for chronic pain and medication refill. She states her pain is located in her neck radiating into her left shoulder, upper back mainly left side and bilateral hips. She rates her pain 7.   Her current exercise regime is walking and performing stretching exercises.  Bianca Goodman arrived to office hypotensive, orthostatic blood pressures were obtain. She states she took her antihypertensive medication this morning, she refuses ED evaluation. She was instructed to purchase a blood pressure machine, staff message sent to her PCP regarding the above. She verbalizes understanding.   Bianca Goodman Morphine equivalent is 40.00  MME.   Last UDS was Performed on 03/02/2022, it was consistent.      Pain Inventory Average Pain 7 Pain Right Now 7 My pain is constant, sharp, burning, and stabbing  In the last 24 hours, has pain interfered with the following? General activity 7 Relation with others 7 Enjoyment of life 7 What TIME of day is your pain at its worst? morning  and daytime Sleep (in general) Poor  Pain is worse with: walking and standing Pain improves with: rest, heat/ice, and medication Relief from Meds:  fair  Family History  Problem Relation Age of Onset   Cancer Mother    Heart disease Father    Heart disease Brother    Social History   Socioeconomic History   Marital status: Widowed    Spouse name: Not on file   Number of children: 1   Years of education: Not on file   Highest education level: Not on file  Occupational History   Not on file  Tobacco Use   Smoking status: Former    Types: Cigarettes    Quit date: 09/20/2019    Years since quitting: 2.7   Smokeless tobacco: Never  Vaping Use   Vaping Use: Never used  Substance and Sexual Activity   Alcohol use: Not Currently   Drug use: Never   Sexual  activity: Yes  Other Topics Concern   Not on file  Social History Narrative   Caffeine one cup daily, coffee with milk.   Lives with son.  Widow.  Education HS.     Social Determinants of Health   Financial Resource Strain: Not on file  Food Insecurity: Not on file  Transportation Needs: Not on file  Physical Activity: Not on file  Stress: Not on file  Social Connections: Not on file   Past Surgical History:  Procedure Laterality Date   APPENDECTOMY     1997   CERVICAL SPINE SURGERY     C4-5 spinal fusion   RIGHT OOPHORECTOMY     scalenectomy     1985   Past Surgical History:  Procedure Laterality Date   APPENDECTOMY     1997   CERVICAL SPINE SURGERY     C4-5 spinal fusion   RIGHT OOPHORECTOMY     scalenectomy     1985   Past Medical History:  Diagnosis Date   Endometriosis    Hypertension    Parkinson's disease    Thyroid disease    There were no vitals taken for this visit.  Opioid Risk Score:   Fall Risk Score:  `1  Depression screen [facility] 2/9     05/05/2022    9:53 AM 04/05/2022    9:45 AM  03/02/2022   10:13 AM 02/24/2022    9:40 AM 01/09/2022    9:31 AM 12/01/2021    9:31 AM 10/20/2021   10:26 AM  Depression screen PHQ 2/9  Decreased Interest 0 1 1 1  0 1 1  Down, Depressed, Hopeless 0 1 1 2  0 1 1  PHQ - 2 Score 0 2 2 3  0 2 2  Altered sleeping    0     Tired, decreased energy    3     Change in appetite    0     Feeling bad or failure about yourself     0     Trouble concentrating    0     Moving slowly or fidgety/restless    1     Suicidal thoughts    0     PHQ-9 Score    7     Difficult doing work/chores    Somewhat difficult       Review of Systems  Musculoskeletal:  Positive for back pain and neck pain.       Left arm pain  All other systems reviewed and are negative.     Objective:   Physical Exam Vitals and nursing note reviewed.  Constitutional:      Appearance: Normal appearance.  Cardiovascular:     Rate and Rhythm: Normal  rate and regular rhythm.     Pulses: Normal pulses.     Heart sounds: Normal heart sounds.  Pulmonary:     Effort: Pulmonary effort is normal.     Breath sounds: Normal breath sounds.  Musculoskeletal:     Cervical back: Normal range of motion and neck supple.     Comments: Normal Muscle Bulk and Muscle Testing Reveals:  Upper Extremities: Right: Full ROM and Muscle Strength 5/5 Left Upper Extremity: Decreased ROM 45 Degrees and Muscle Strength 5/5 Left AC Joint Tenderness Thoracic Hypersensitivity: T-1-T-7 Mainly Left Side Lower Extremities: Right :  Full ROM and Muscle Strength 5/5 Left Lower Extremity: Decreased ROM and Muscle Strength 5/5 Left Lower Extremity Flexion Produces Pain into Left Lower Extremity. Transfer to wheelchair    Skin:    General: Skin is warm and dry.  Neurological:     Mental Status: She is alert and oriented to person, place, and time.  Psychiatric:        Mood and Affect: Mood normal.        Behavior: Behavior normal.         Assessment & Plan:  1.Cervicalgia/ Cervical Radiculitis: Continue HEP as Tolerated. Continue Gabapentin 100 mg BID .Continue to monitor. 06/15/2022 Left Arm Radicular Pain: Continue Gabapentin . Continue to Monitor.06/15/2022 2. Chronic Pain Syndrome: Hydrocodone 7.5 mg/325 one tablet 5  times a day as needed for pain #150. We will continue the opioid monitoring program, this consists of regular clinic visits, examinations, urine drug screen, pill counts as well as use of [place] Controlled Substance Reporting system. A 12 month History has been reviewed on the [place] Today.   Continue current medication regimen. Continue to monitor. 06/15/2022 3. Left Hand Pain: Ortho Following. Continue to monitor. 06/15/2022 4. Chronic Thoracic Pain: Continue current medication regimen. Continue to monitor. 06/15/2022 5. Bilateral Greater Trochanter Bursitis:  Continue alternate Ice and Heat  Therapy . Continue to monitor. 06/15/2022 6. Left Hand Tremor: Neurology Following. Continue to monitor. 06/15/2022  7. Fall at Home: No Falls this month. Educated on Falls Prevention: She verbalizes understanding.  06/15/2022 8. Orthostatic Hypotension: Blood Pressure checked. She refused ED evaluation, Staff message sent to Dr Gena Fray. She was instructed to purchase blood pressure machine and keep a blood pressure log. She will call Dr Gena Fray with readings, she verbalizes understanding.   F/U in 1 month

## 2022-06-27 NOTE — Progress Notes (Signed)
[facility] [address] [place]. [place] [address] [phone]       OFFICE FOLLOW UP NOTE  [name] Date of Birth:  01-07-1961 Medical Record Number:  [id number]    Primary neurologist: Dr. Frances Furbish Reason for visit: Parkinsonism    SUBJECTIVE:   CHIEF COMPLAINT:  Chief Complaint  Patient presents with   Follow-up    Rm 3 with [facility] Interpreter Pt is well, reports tremors have worsen significantly since last visit as well as intermittent freezing spells      HPI:   Bianca Goodman is a 62 y.o. female who is routinely followed in office for left-sided predominance parkinsonism with symptom onset around 2021.   At prior visit on 02/14/2022 with Dr. Frances Furbish, pramipexole dosage increased to 1mg  1 pill TID due to intermittent freezing spells, stiffness and fine motor control.  Interval history:   She is accompanied by [place] interpreter.  She reports worsening of tremor L>R since last visit as well as intermittent freezing spells, gradually worsening since prior visit. Continues on pramipexole 1mg  TID, tolerating well. She becomes tearful during visit due to Parkinson symptoms, PCP increased fluoxetine dosage at prior visit in October but did not f/u after that as advised.  Continues to follow with PMR for chronic pain management.      ROS:   14 system review of systems performed and negative with exception of those listed in HPI  PMH:  Past Medical History:  Diagnosis Date   Endometriosis    Hypertension    Parkinson's disease    Thyroid disease     PSH:  Past Surgical History:  Procedure Laterality Date   APPENDECTOMY     1997   CERVICAL SPINE SURGERY     C4-5 spinal fusion   RIGHT OOPHORECTOMY     scalenectomy     1985    Social History:  Social History   Socioeconomic History   Marital status: Widowed    Spouse name: Not on file   Number of children: 1   Years of education: Not on file   Highest education level: Not on file   Occupational History   Not on file  Tobacco Use   Smoking status: Former    Types: Cigarettes    Quit date: 09/20/2019    Years since quitting: 2.7   Smokeless tobacco: Never  Vaping Use   Vaping Use: Never used  Substance and Sexual Activity   Alcohol use: Not Currently   Drug use: Never   Sexual activity: Yes  Other Topics Concern   Not on file  Social History Narrative   Caffeine one cup daily, coffee with milk.   Lives with son.  Widow.  Education HS.     Social Determinants of Health   Financial Resource Strain: Not on file  Food Insecurity: Not on file  Transportation Needs: Not on file  Physical Activity: Not on file  Stress: Not on file  Social Connections: Not on file  Intimate Partner Violence: Not on file    Family History:  Family History  Problem Relation Age of Onset   Cancer Mother    Heart disease Father    Heart disease Brother     Medications:   Current Outpatient Medications on File Prior to Visit  Medication Sig Dispense Refill   FLUoxetine (PROZAC) 40 MG capsule Take 1 capsule (40 mg total) by mouth daily. 90 capsule 3   gabapentin (NEURONTIN) 100 MG capsule Take 1 capsule by mouth  at bedtime 30 capsule 3   hydrochlorothiazide (MICROZIDE) 12.5 MG capsule Take 1 capsule (12.5 mg total) by mouth daily. 90 capsule 3   HYDROcodone-acetaminophen (NORCO) 10-325 MG tablet Take 1 tablet by mouth every 6 (six) hours as needed. 120 tablet 0   hydroxypropyl methylcellulose / hypromellose (ISOPTO TEARS / GONIOVISC) 2.5 % ophthalmic solution Place 1 drop into both eyes 3 (three) times daily as needed for dry eyes. 15 mL 12   levothyroxine (SYNTHROID) 88 MCG tablet Take 1 tablet (88 mcg total) by mouth daily. 90 tablet 3   losartan (COZAAR) 50 MG tablet Take 1 tablet (50 mg total) by mouth daily. 90 tablet 3   Polyvinyl Alcohol-Povidone (ARTIFICIAL TEARS) 5-6 MG/ML SOLN Place 1 drop into both eyes 3 (three) times daily as needed. 30 mL 5   pramipexole (MIRAPEX)  1 MG tablet Take 1 tablet (1 mg total) by mouth 3 (three) times daily. 90 tablet 5   verapamil (CALAN) 120 MG tablet Take 1 tablet (120 mg total) by mouth 2 (two) times daily. 180 tablet 3   No current facility-administered medications on file prior to visit.    Allergies:   Allergies  Allergen Reactions   Cortizone-10 [Hydrocortisone] Rash   Tramadol Nausea Only      OBJECTIVE:  Physical Exam  Vitals:   06/28/22 1345  BP: 118/81  Pulse: 99  Weight: 157 lb (71.2 kg)  Height: 5\' 5"  (1.651 m)   Body mass index is 26.13 kg/m. No results found.   General: well developed, well nourished, middle-aged female, seated, in no evident distress Head: head normocephalic and atraumatic.   Neck: supple with no carotid or supraclavicular bruits Cardiovascular: regular rate and rhythm, no murmurs Musculoskeletal: no deformity Skin:  no rash/petichiae Vascular:  Normal pulses all extremities   Neurologic Exam Mental Status: Awake and fully alert.  Fluent speech and language.  Oriented to place and time. Recent and remote memory intact. Attention span, concentration and fund of knowledge appropriate. Mood and affect appropriate but tearful at times.  Mild to moderate facial masking.  Cranial Nerves: Pupils equal, briskly reactive to light. Extraocular movements full without nystagmus. Visual fields full to confrontation. Hearing intact. Facial sensation intact. Face, tongue, palate moves normally and symmetrically.  Motor: Full strength in all tested extremities.  Increased tone LUE>RUE with mild cogwheel rigidity, and mild increased tone in LLE.  Intermittent mild resting tremor in LUE, no significant postural action tremor.  Difficulty extending fingers.  Moderately decreased fine motor LUE, mild on RUE. Sensory.: intact to touch , pinprick , position and vibratory sensation.  Gait and Station: Arises from chair with mild difficulty. Stance is hunched. Gait demonstrates decreased stride  length and step height bilaterally with mild unsteadiness, decreased arm swing L>R.  Tandem walk and heel toe not attempted. Reflexes: 1+ and symmetric. Toes downgoing.       ASSESSMENT/PLAN: Bianca Goodman is a 61 y.o. year old female with left-sided predominance parkinsonism with symptoms starting around 2021.  Reports continued gradual worsening of tremor, stiffness and freezing spells.   Recommend initiating Sinemet 25/100 half tab 3 times daily for 1 month then increase to full tablet 3 times daily. Discussed potential side effects and time of day to take medication, patient verbalized understanding. Continue pramipexole 1mg  three times daily  Highly encouraged to follow-up with PCP for continued depression symptoms    Follow up in 3 months or call earlier if needed   CC:  PCP: [name], MD  I spent 28 minutes of face-to-face and non-face-to-face time with patient assisted by interpreter.  This included previsit chart review, lab review, study review, order entry, electronic health record documentation, patient education and discussion regarding the above and answered all the questions to patient's satisfaction   Ihor Austin, [facility]  [facility] [address] [place], [place] [address]  Phone [phone] Fax [phone] Note: This document was prepared with digital dictation and possible smart phrase technology. Any transcriptional errors that result from this process are unintentional.

## 2022-06-28 ENCOUNTER — Encounter: Payer: Self-pay | Admitting: Adult Health

## 2022-06-28 ENCOUNTER — Ambulatory Visit (INDEPENDENT_AMBULATORY_CARE_PROVIDER_SITE_OTHER): Payer: Medicaid Other | Admitting: Adult Health

## 2022-06-28 VITALS — BP 118/81 | HR 99 | Ht 65.0 in | Wt 157.0 lb

## 2022-06-28 DIAGNOSIS — G20C Parkinsonism, unspecified: Secondary | ICD-10-CM | POA: Diagnosis not present

## 2022-06-28 MED ORDER — CARBIDOPA-LEVODOPA 25-100 MG PO TABS: 1.0000 | ORAL_TABLET | Freq: Three times a day (TID) | ORAL | 3 refills | Status: DC

## 2022-06-28 MED ORDER — CARBIDOPA-LEVODOPA 25-100 MG PO TABS: 0.5000 | ORAL_TABLET | Freq: Three times a day (TID) | ORAL | 0 refills | Status: DC

## 2022-06-28 NOTE — Patient Instructions (Addendum)
Recommend starting Sinemet 0.5 tablets three times daily for 1 month then increase to a full tablet three times daily  Continue pramipexole 1mg  three times daily   Follow up with Dr. Rexene Alberts in 3 months or call earlier if needed

## 2022-07-17 ENCOUNTER — Telehealth: Payer: Self-pay

## 2022-07-17 NOTE — Telephone Encounter (Signed)
Patient wants to know if she can change her visit to a phone visit tomorrow due to her not having a way to her appointment and also not feeling to well having stomach issues and doesn't want to have an accident while she is out

## 2022-07-18 ENCOUNTER — Encounter: Payer: Medicaid Other | Admitting: Registered Nurse

## 2022-07-25 ENCOUNTER — Other Ambulatory Visit: Payer: Self-pay | Admitting: Adult Health

## 2022-07-26 ENCOUNTER — Encounter: Payer: Medicaid Other | Admitting: Registered Nurse

## 2022-07-26 ENCOUNTER — Telehealth: Payer: Self-pay | Admitting: Registered Nurse

## 2022-07-26 MED ORDER — HYDROCODONE-ACETAMINOPHEN 10-325 MG PO TABS: 1.0000 | ORAL_TABLET | Freq: Four times a day (QID) | ORAL | 0 refills | Status: DC | PRN

## 2022-07-26 NOTE — Telephone Encounter (Signed)
[name] called office with Transportation issue. Her appointment was re-scheduled.  Hydrocodone e-scribed to pharmacy, Bianca Goodman is aware of the above and verbalizes understanding.

## 2022-07-26 NOTE — Addendum Note (Signed)
Addended by: Bayard Hugger on: 07/26/2022 04:27 PM   Modules accepted: Orders

## 2022-07-26 NOTE — Telephone Encounter (Signed)
Patient was unable to come in due to transportation not picking her up . Patient will run out of medication Friday 3/8 and needs refill on Hydrocodone sent to [facility] on file

## 2022-08-02 ENCOUNTER — Encounter: Payer: Self-pay | Admitting: Registered Nurse

## 2022-08-02 ENCOUNTER — Encounter: Payer: Medicaid Other | Attending: Registered Nurse | Admitting: Registered Nurse

## 2022-08-02 VITALS — HR 87 | Ht 65.0 in | Wt 163.0 lb

## 2022-08-02 DIAGNOSIS — M25512 Pain in left shoulder: Secondary | ICD-10-CM | POA: Diagnosis present

## 2022-08-02 DIAGNOSIS — G894 Chronic pain syndrome: Secondary | ICD-10-CM | POA: Diagnosis not present

## 2022-08-02 DIAGNOSIS — M546 Pain in thoracic spine: Secondary | ICD-10-CM | POA: Diagnosis not present

## 2022-08-02 DIAGNOSIS — Z79891 Long term (current) use of opiate analgesic: Secondary | ICD-10-CM

## 2022-08-02 DIAGNOSIS — M542 Cervicalgia: Secondary | ICD-10-CM

## 2022-08-02 DIAGNOSIS — M5412 Radiculopathy, cervical region: Secondary | ICD-10-CM

## 2022-08-02 DIAGNOSIS — Z5181 Encounter for therapeutic drug level monitoring: Secondary | ICD-10-CM | POA: Diagnosis present

## 2022-08-02 DIAGNOSIS — G8929 Other chronic pain: Secondary | ICD-10-CM | POA: Diagnosis present

## 2022-08-02 DIAGNOSIS — M792 Neuralgia and neuritis, unspecified: Secondary | ICD-10-CM | POA: Diagnosis present

## 2022-08-02 NOTE — Patient Instructions (Addendum)
Call your Ride to Take you for your X-rays.   [facility]   Address: [address], [place], [place] [address] Hours:  Open 24 hours  Phone: [phone]

## 2022-08-02 NOTE — Progress Notes (Signed)
Subjective:    Patient ID: Bianca Goodman, female    DOB: 08-May-1961, 62 y.o.   MRN: [id number]  HPI: Bianca Goodman is a 62 y.o. female who returns for follow up appointment for chronic pain and medication refill. She states her pain is located in her neck radiating into her left shoulder, left shoulder pain. She also reports mid- back pain and her pain has increased in intensity She denies falling, we will order X-rays, she verbalizes understanding. She rates her pain 7. Her current exercise regime is walking and performing stretching exercises.  Bianca Goodman Morphine equivalent is 40.00 MME.   Last UDS was Performed on 03/02/2022, it was consistent.   Interpretor in room all questions answered.    Blood Pressure: 100/68 P 87 and O2 sat: 98% Pain Inventory Average Pain 7 Pain Right Now 7 My pain is constant, sharp, and stabbing  In the last 24 hours, has pain interfered with the following? General activity 7 Relation with others 7 Enjoyment of life 6 What TIME of day is your pain at its worst? morning  and daytime Sleep (in general) Poor  Pain is worse with: walking and standing Pain improves with: rest, heat/ice, and medication Relief from Meds: 7  Family History  Problem Relation Age of Onset   Cancer Mother    Heart disease Father    Heart disease Brother    Social History   Socioeconomic History   Marital status: Widowed    Spouse name: Not on file   Number of children: 1   Years of education: Not on file   Highest education level: Not on file  Occupational History   Not on file  Tobacco Use   Smoking status: Former    Types: Cigarettes    Quit date: 09/20/2019    Years since quitting: 2.8   Smokeless tobacco: Never  Vaping Use   Vaping Use: Never used  Substance and Sexual Activity   Alcohol use: Not Currently   Drug use: Never   Sexual activity: Yes  Other Topics Concern   Not on file  Social History Narrative   Caffeine one cup daily, coffee with milk.    Lives with son.  Widow.  Education HS.     Social Determinants of Health   Financial Resource Strain: Not on file  Food Insecurity: Not on file  Transportation Needs: Not on file  Physical Activity: Not on file  Stress: Not on file  Social Connections: Not on file   Past Surgical History:  Procedure Laterality Date   [place]     C4-5 spinal fusion   RIGHT OOPHORECTOMY     scalenectomy     1985   Past Surgical History:  Procedure Laterality Date   APPENDECTOMY     1997   CERVICAL SPINE SURGERY     C4-5 spinal fusion   RIGHT OOPHORECTOMY     scalenectomy     1985   Past Medical History:  Diagnosis Date   Endometriosis    Hypertension    Parkinson's disease    Thyroid disease    BP 100/68   Pulse 87   Resp (!) 98   Ht '5\' 5"'$  (1.651 m)   Wt 163 lb (73.9 kg)   BMI 27.12 kg/m   Opioid Risk Score:   Fall Risk Score:  `1  Depression screen [facility] 2/9     06/15/2022    9:23 AM 05/05/2022  9:53 AM 04/05/2022    9:45 AM 03/02/2022   10:13 AM 02/24/2022    9:40 AM 01/09/2022    9:31 AM 12/01/2021    9:31 AM  Depression screen PHQ 2/9  Decreased Interest 1 0 '1 1 1 '$ 0 1  Down, Depressed, Hopeless 1 0 '1 1 2 '$ 0 1  PHQ - 2 Score 2 0 '2 2 3 '$ 0 2  Altered sleeping     0    Tired, decreased energy     3    Change in appetite     0    Feeling bad or failure about yourself      0    Trouble concentrating     0    Moving slowly or fidgety/restless     1    Suicidal thoughts     0    PHQ-9 Score     7    Difficult doing work/chores     Somewhat difficult       Review of Systems  Musculoskeletal:  Positive for back pain.       LT shoulder  All other systems reviewed and are negative.      Objective:   Physical Exam Vitals and nursing note reviewed.  Constitutional:      Appearance: Normal appearance.  Neck:     Comments: Cervical Paraspinal Tenderness: C-5-C-6 Cardiovascular:     Rate and Rhythm: Normal rate and regular  rhythm.     Pulses: Normal pulses.     Heart sounds: Normal heart sounds.  Musculoskeletal:     Cervical back: Normal range of motion and neck supple.     Comments: Normal Muscle Bulk and Muscle Testing Reveals:  Upper Extremities: Right: Full ROM and Muscle Strength 5/5 Left Upper Extremity: Decreased ROM 30 Degrees and Muscle Strength 3/5 Left AC Joint Tenderness Thoracic  Paraspinal Tenderness: T-1-T-7  Lower Extremities: Full ROM and Muscle Strength 5/5 Arises from Table slowly Narrow Based  Gait     Skin:    General: Skin is warm and dry.  Neurological:     Mental Status: She is alert and oriented to person, place, and time.  Psychiatric:        Mood and Affect: Mood normal.        Behavior: Behavior normal.         Assessment & Plan:  1.Cervicalgia/ Cervical Radiculitis:RX: Cervical X-ray  Continue HEP as Tolerated. Continue Gabapentin 100 mg BID .Continue to monitor. 08/02/2022 Left Arm Radicular Pain: Continue Gabapentin . Continue to Monitor.08/02/2022 2. Chronic Pain Syndrome: Continue Hydrocodone 10 mg/325 one tablet 4  times a day as needed for pain #120. We will continue the opioid monitoring program, this consists of regular clinic visits, examinations, urine drug screen, pill counts as well as use of [place] Controlled Substance Reporting system. A 12 month History has been reviewed on the [facility] Today.   Continue current medication regimen. Continue to monitor. 08/02/2022 3. Left Hand Pain: Ortho Following. Continue to monitor. 08/02/2022 4. Chronic Thoracic Pain: Continue current medication regimen. Continue to monitor. 08/02/2022 5. Bilateral Greater Trochanter Bursitis:  Continue alternate Ice and Heat Therapy . Continue to monitor. 08/02/2022 6. Left Hand Tremor: Neurology Following. Continue to monitor. 08/02/2022 7 Fall at Home: No Falls this month. Educated on Falls Prevention: She verbalizes understanding.  08/02/2022 8.Left Shoulder Pain: RX X-ray. Continue to Monitor.   F/U in 1 month

## 2022-08-23 ENCOUNTER — Ambulatory Visit (HOSPITAL_BASED_OUTPATIENT_CLINIC_OR_DEPARTMENT_OTHER)
Admission: RE | Admit: 2022-08-23 | Discharge: 2022-08-23 | Disposition: A | Discharge status: Home / Self Care | Payer: Medicaid Other | Source: Ambulatory Visit | Attending: Registered Nurse | Admitting: Registered Nurse

## 2022-08-23 DIAGNOSIS — M546 Pain in thoracic spine: Secondary | ICD-10-CM | POA: Insufficient documentation

## 2022-08-23 DIAGNOSIS — G8929 Other chronic pain: Secondary | ICD-10-CM | POA: Insufficient documentation

## 2022-08-23 DIAGNOSIS — M5412 Radiculopathy, cervical region: Secondary | ICD-10-CM | POA: Diagnosis present

## 2022-08-23 DIAGNOSIS — M542 Cervicalgia: Secondary | ICD-10-CM | POA: Diagnosis present

## 2022-08-23 DIAGNOSIS — M25512 Pain in left shoulder: Secondary | ICD-10-CM | POA: Diagnosis present

## 2022-08-24 ENCOUNTER — Telehealth: Payer: Self-pay | Admitting: Registered Nurse

## 2022-08-24 MED ORDER — HYDROCODONE-ACETAMINOPHEN 10-325 MG PO TABS: 1.0000 | ORAL_TABLET | Freq: Four times a day (QID) | ORAL | 0 refills | Status: DC | PRN

## 2022-08-24 NOTE — Telephone Encounter (Addendum)
PMP was reviewed.  Return Bianca Goodman call , she reports she is not feeling well today. She feels weak, she was instructed to call her PCP to schedule an appointment She verbalizes understanding.  She states she will call Dr Gena Fray office to schedule an appointment.  Hydrocodone e-scribed today.  Bianca Goodman understanding.

## 2022-08-24 NOTE — Addendum Note (Signed)
Addended by: Bayard Hugger on: 08/24/2022 03:57 PM   Modules accepted: Orders

## 2022-08-24 NOTE — Telephone Encounter (Signed)
Patient called in requesting to reschedule her appointment , patient requested to reschedule 4/17 but states she will run out of medication in 3 days and asked if hydrocodone can be sent to [facility] to pick up 4/7 since her son can pick up meds that day . Patient states she went to get her xrays done yesterday 4/3 and she is not feeling well

## 2022-08-25 ENCOUNTER — Encounter: Payer: Medicaid Other | Admitting: Registered Nurse

## 2022-08-28 ENCOUNTER — Telehealth: Payer: Self-pay

## 2022-08-28 ENCOUNTER — Telehealth: Payer: Self-pay | Admitting: Registered Nurse

## 2022-08-28 NOTE — Telephone Encounter (Signed)
PA for pain medication submitted today in Cover my meds. Will you please review Xray results?

## 2022-08-28 NOTE — Telephone Encounter (Signed)
Rx approved form 08/28/2022 to 02/24/2023.

## 2022-08-28 NOTE — Telephone Encounter (Signed)
08/28/2022 ; PA for Hydrocodone 10-325 created in Cover My Med. USED En[facility] Skeens. Patient also uses Myles Lipps.

## 2022-08-28 NOTE — Telephone Encounter (Signed)
Patient called in and states she was unable to get her medication , she was told that we have to do a prior authorization and she said she runs out of medication today . Patient also asked if we had received xray results

## 2022-08-31 ENCOUNTER — Ambulatory Visit: Payer: Medicaid Other | Admitting: Family Medicine

## 2022-09-04 NOTE — Telephone Encounter (Signed)
Call Placed to Bianca Goodman,  X-rays was reviewed.  She verbalizes understanding.

## 2022-09-06 ENCOUNTER — Encounter: Payer: Medicaid Other | Attending: Registered Nurse | Admitting: Registered Nurse

## 2022-09-06 ENCOUNTER — Encounter: Payer: Self-pay | Admitting: Registered Nurse

## 2022-09-06 VITALS — BP 109/75 | HR 98 | Ht 65.0 in | Wt 165.0 lb

## 2022-09-06 DIAGNOSIS — M5416 Radiculopathy, lumbar region: Secondary | ICD-10-CM | POA: Insufficient documentation

## 2022-09-06 DIAGNOSIS — Z79891 Long term (current) use of opiate analgesic: Secondary | ICD-10-CM | POA: Insufficient documentation

## 2022-09-06 DIAGNOSIS — M792 Neuralgia and neuritis, unspecified: Secondary | ICD-10-CM | POA: Insufficient documentation

## 2022-09-06 DIAGNOSIS — M542 Cervicalgia: Secondary | ICD-10-CM | POA: Diagnosis present

## 2022-09-06 DIAGNOSIS — M5412 Radiculopathy, cervical region: Secondary | ICD-10-CM | POA: Diagnosis present

## 2022-09-06 DIAGNOSIS — M545 Low back pain, unspecified: Secondary | ICD-10-CM | POA: Diagnosis not present

## 2022-09-06 DIAGNOSIS — G8929 Other chronic pain: Secondary | ICD-10-CM | POA: Diagnosis present

## 2022-09-06 DIAGNOSIS — G894 Chronic pain syndrome: Secondary | ICD-10-CM | POA: Diagnosis not present

## 2022-09-06 DIAGNOSIS — Z5181 Encounter for therapeutic drug level monitoring: Secondary | ICD-10-CM | POA: Diagnosis not present

## 2022-09-06 MED ORDER — PREGABALIN 25 MG PO CAPS: 25.0000 mg | ORAL_CAPSULE | Freq: Every day | ORAL | 0 refills | Status: DC

## 2022-09-06 MED ORDER — HYDROCODONE-ACETAMINOPHEN 10-325 MG PO TABS: 1.0000 | ORAL_TABLET | Freq: Four times a day (QID) | ORAL | 0 refills | Status: DC | PRN

## 2022-09-06 NOTE — Patient Instructions (Signed)
Stop Gabapentin:   I sent a Prescription to [facility] for Pregabalin ( Lyrica for the Nerve Pain)  Only Take at Night   Call office in two weeks to give update on medication change and Nerve Pain

## 2022-09-06 NOTE — Progress Notes (Signed)
Subjective:    Patient ID: Bianca Goodman, female    DOB: 05-20-1961, Bianca Goodman  MRN: [id number]  HPI: Bianca Goodman is a 62 y.o. female who returns for follow up appointment for chronic pain and medication refill. She states her pain is located in her lower back radiating into her groin, she reports increase intensity of lower back pain. We will obtain a X-ray, she verbalizes understanding. Denies any hip pain.  She also states she is having  neck pain radiating into her left shoulder and left arm with tingling and burning. She rates her pain 7. Her current exercise regime is walking and performing stretching exercises.  Bianca Goodman Morphine equivalent is 40.00 MME.   UDS ordered today.      Pain Inventory Average Pain 7 Pain Right Now 7 My pain is sharp, burning, and tingling  In the last 24 hours, has pain interfered with the following? General activity 6 Relation with others 6 Enjoyment of life 6 What TIME of day is your pain at its worst? morning  and daytime Sleep (in general) Poor  Pain is worse with: walking and sitting Pain improves with: rest, heat/ice, and medication Relief from Meds: 6  Family History  Problem Relation Age of Onset   Cancer Mother    Heart disease Father    Heart disease Brother    Social History   Socioeconomic History   Marital status: Widowed    Spouse name: Not on file   Number of children: 1   Years of education: Not on file   Highest education level: Not on file  Occupational History   Not on file  Tobacco Use   Smoking status: Former    Types: Cigarettes    Quit date: 09/20/2019    Years since quitting: 2.9   Smokeless tobacco: Never  Vaping Use   Vaping Use: Never used  Substance and Sexual Activity   Alcohol use: Not Currently   Drug use: Never   Sexual activity: Yes  Other Topics Concern   Not on file  Social History Narrative   Caffeine one cup daily, coffee with milk.   Lives with son.  Widow.  Education HS.     Social  Determinants of Health   Financial Resource Strain: Not on file  Food Insecurity: Not on file  Transportation Needs: Not on file  Physical Activity: Not on file  Stress: Not on file  Social Connections: Not on file   Past Surgical History:  Procedure Laterality Date   APPENDECTOMY     1997   CERVICAL SPINE SURGERY     C4-5 spinal fusion   RIGHT OOPHORECTOMY     scalenectomy     1985   Past Surgical History:  Procedure Laterality Date   APPENDECTOMY     1997   CERVICAL SPINE SURGERY     C4-5 spinal fusion   RIGHT OOPHORECTOMY     scalenectomy     1985   Past Medical History:  Diagnosis Date   Endometriosis    Hypertension    Parkinson's disease    Thyroid disease    There were no vitals taken for this visit.  Opioid Risk Score:   Fall Risk Score:  `1  Depression screen [facility] 2/9     08/02/2022    9:59 AM 06/15/2022    9:23 AM 05/05/2022    9:53 AM 04/05/2022    9:45 AM 03/02/2022   10:13 AM 02/24/2022    9:40 AM  01/09/2022    9:31 AM  Depression screen PHQ 2/9  Decreased Interest 1 1 0 0  Down, Depressed, Hopeless  1 0 0  PHQ - 2 Score 1 2 0 0  Altered sleeping 1     0   Tired, decreased energy      3   Change in appetite      0   Feeling bad or failure about yourself       0   Trouble concentrating      0   Moving slowly or fidgety/restless      1   Suicidal thoughts      0   PHQ-9 Score 2     7   Difficult doing work/chores      Somewhat difficult     Review of Systems  All other systems reviewed and are negative.      Objective:   Physical Exam Vitals and nursing note reviewed.  Constitutional:      Appearance: Normal appearance.  Neck:     Comments: Cervical Paraspinal Tenderness: C-5- C-6  Cardiovascular:     Rate and Rhythm: Normal rate and regular rhythm.     Pulses: Normal pulses.     Heart sounds: Normal heart sounds.  Pulmonary:     Effort: Pulmonary effort is normal.     Breath sounds: Normal breath sounds.   Musculoskeletal:     Cervical back: Normal range of motion and neck supple.     Comments: Normal Muscle Bulk and Muscle Testing Reveals:  Upper Extremities: Right: Full ROM and Muscle Strength  4/5 Left Upper Extremity: Decreased ROM 45 Degrees and Muscle Strength 3/5 Left AC Joint Tenderness Thoracic Paraspinal Tenderness: T-1-T-7 Mainly Left Side T- 7- 9  Lumbar Paraspinal Tenderness: L-3-L-5 Lower Extremities Decreased ROM and Muscle Strength 5/5 Bilateral Lower Extremities Flexion Produces Pain into her Bilateral Groins Arises from Table Slowly Antalgic  Gait     Skin:    General: Skin is warm and dry.  Neurological:     Mental Status: She is alert and oriented to person, place, and time.  Psychiatric:        Mood and Affect: Mood normal.        Behavior: Behavior normal.         Assessment & Plan:  Acute Exacerbation of Chronic Low Back Pain: RX: Lumbar X-ray  2..Cervicalgia/ Cervical Radiculitis:RX: Cervical X-ray  Continue HEP as Tolerated. Continue Gabapentin 100 mg BID .Continue to monitor. 08/02/2022 Left Arm Radicular Pain: Continue Gabapentin . Continue to Monitor.09/06/2022 3. Chronic Pain Syndrome: Continue Hydrocodone 10 mg/325 one tablet 4  times a day as needed for pain #120. We will continue the opioid monitoring program, this consists of regular clinic visits, examinations, urine drug screen, pill counts as well as use of [place] Controlled Substance Reporting system. A 12 month History has been reviewed on the [place] Controlled Substance Reporting System Today.   Continue current medication regimen. Continue to monitor. 09/06/2022 4. Left Hand Pain: Ortho Following. Continue to monitor. 09/06/2022 5. Chronic Thoracic Pain: Continue current medication regimen. Continue to monitor. 09/06/2022 6. Bilateral Greater Trochanter Bursitis:  Continue alternate Ice and Heat Therapy . Continue to monitor. 09/06/2022 7. Left Hand Tremor: Neurology Following.  Continue to monitor. 09/06/2022 8. Fall at Home: No Falls this month. Educated on Falls Prevention: She verbalizes understanding. 09/06/2022     F/U in 1 month

## 2022-09-12 LAB — TOXASSURE SELECT,+ANTIDEPR,UR

## 2022-09-13 ENCOUNTER — Encounter: Payer: Self-pay | Admitting: Family Medicine

## 2022-09-13 ENCOUNTER — Ambulatory Visit (INDEPENDENT_AMBULATORY_CARE_PROVIDER_SITE_OTHER): Payer: Medicaid Other | Admitting: Family Medicine

## 2022-09-13 VITALS — BP 118/74 | HR 88 | Temp 98.2°F | Ht 65.0 in | Wt 166.8 lb

## 2022-09-13 DIAGNOSIS — I10 Essential (primary) hypertension: Secondary | ICD-10-CM

## 2022-09-13 DIAGNOSIS — E039 Hypothyroidism, unspecified: Secondary | ICD-10-CM | POA: Diagnosis not present

## 2022-09-13 DIAGNOSIS — F321 Major depressive disorder, single episode, moderate: Secondary | ICD-10-CM | POA: Diagnosis not present

## 2022-09-13 DIAGNOSIS — G20A2 Parkinson's disease without dyskinesia, with fluctuations: Secondary | ICD-10-CM | POA: Diagnosis not present

## 2022-09-13 DIAGNOSIS — R7303 Prediabetes: Secondary | ICD-10-CM | POA: Diagnosis not present

## 2022-09-13 DIAGNOSIS — R5383 Other fatigue: Secondary | ICD-10-CM

## 2022-09-13 DIAGNOSIS — E538 Deficiency of other specified B group vitamins: Secondary | ICD-10-CM

## 2022-09-13 DIAGNOSIS — E559 Vitamin D deficiency, unspecified: Secondary | ICD-10-CM

## 2022-09-13 DIAGNOSIS — M792 Neuralgia and neuritis, unspecified: Secondary | ICD-10-CM

## 2022-09-13 DIAGNOSIS — R350 Frequency of micturition: Secondary | ICD-10-CM

## 2022-09-13 LAB — BASIC METABOLIC PANEL
BUN: 12 mg/dL (ref 6–23)
CO2: 27 mEq/L (ref 19–32)
Calcium: 9.3 mg/dL (ref 8.4–10.5)
Chloride: 102 mEq/L (ref 96–112)
Creatinine, Ser: 0.69 mg/dL (ref 0.40–1.20)
GFR: 94.87 mL/min (ref >60.00)
Glucose, Bld: 91 mg/dL (ref 70–99)
Potassium: 4.5 mEq/L (ref 3.5–5.1)
Sodium: 140 mEq/L (ref 135–145)

## 2022-09-13 LAB — HEMOGLOBIN A1C: Hgb A1c MFr Bld: 6.5 % (ref 4.6–6.5)

## 2022-09-13 LAB — VITAMIN B12: Vitamin B-12: 167 pg/mL — ABNORMAL LOW (ref 211–911)

## 2022-09-13 LAB — VITAMIN D 25 HYDROXY (VIT D DEFICIENCY, FRACTURES): VITD: 13.4 ng/mL — ABNORMAL LOW (ref 30.00–100.00)

## 2022-09-13 LAB — TSH: TSH: 0.98 u[IU]/mL (ref 0.35–5.50)

## 2022-09-13 NOTE — Assessment & Plan Note (Signed)
I will repeat her A1c and blood sugar today.

## 2022-09-13 NOTE — Progress Notes (Signed)
[facility] [address] [place] [address] Dept: [phone] Dept Fax: [phone]  Chronic Care Office Visit  Subjective:    Patient ID: Bianca Goodman, female    DOB: September 06, 1960, 62 y.o..   MRN: [id number]  Chief Complaint  Patient presents with   Fatigue    C/o feeling fatigue, not sleeping well, and feeling weak x 2-3 months.      Medical Interpreter: Rosalyn Charters  History of Present Illness:  Patient is in today for reassessment of chronic medical issues.  Bianca Goodman has a history of Parkinson's disease. She is managed by Dr. Frances Furbish (neurology). She is currently managed on pramipexole 1 mg TID and Sinemet 25-100 mg 1/2 tab TID. The addition of the Sinemet has helped to reduce her tremor. She still notes episodes of bradykinesia and postural instability. She has been unable to work due to this and her left radicular issues. She notes she is struggling financially. She has been able to get food stamps, but needs assistance with paying for other basic needs. She does have a Child psychotherapist that has helped her get on Medicaid and another that helped her with food stamps. She is unsure where to turn about assistance with applying for disability.  Bianca Goodman continues to deal with depressive symptoms related to fears about her Parkinson's disease. She stopped taking her fluoxetine, as she felt it was not helping. She expresses that she is coming to terms with not being able to do everything she was able to in the past.   Bianca Goodman continues to work with [facility] Maisie Fus) for her chronic pain management. She was recently switched form gabapentin to pregabalin. She is having less dizziness now, and feels it helps her relax at night.   Bianca Goodman has a history of hypertension. She is managed on losartan 50 mg daily, verapamil 120 mg bid, and HCTZ 12.5 mg daily. She notes she has been having issues  with frequent urination. She urinates about every 2-3 hours during the day, but feels the urge every 20 min. at night. She is feeling overall fatigue and feels she has trouble sleeping.   Bianca Goodman has a history of hypothyroidism. She is managed on levothyroxine 88 mcg daily. She admits to some degree of cold intolerance.   Past Medical History: Patient Active Problem List   Diagnosis Date Noted   Parkinson disease 05/03/2021   Depression, major, single episode, moderate 05/03/2021   Atypical squamous cell changes of undetermined significance (ASCUS) on cervical cytology with negative high risk human papilloma virus (HPV) test result 11/09/2020   Essential hypertension 10/28/2020   Hypothyroidism 10/28/2020   History of endometriosis 10/28/2020   Prediabetes 10/28/2020   Radicular pain in left arm 09/14/2020   Past Surgical History:  Procedure Laterality Date   APPENDECTOMY     1997   CERVICAL SPINE SURGERY     C4-5 spinal fusion   RIGHT OOPHORECTOMY     scalenectomy     1985   Family History  Problem Relation Age of Onset   Cancer Mother    Heart disease Father    Heart disease Brother    Outpatient Medications Prior to Visit  Medication Sig Dispense Refill   carbidopa-levodopa (SINEMET IR) 25-100 MG tablet Take 0.5 tablets by mouth 3 (three) times daily. 45 tablet 0   HYDROcodone-acetaminophen (NORCO) 10-325 MG tablet Take 1 tablet by mouth every 6 (six) hours as needed. 120 tablet  0   hydroxypropyl methylcellulose / hypromellose (ISOPTO TEARS / GONIOVISC) 2.5 % ophthalmic solution Place 1 drop into both eyes 3 (three) times daily as needed for dry eyes. 15 mL 12   levothyroxine (SYNTHROID) 88 MCG tablet Take 1 tablet (88 mcg total) by mouth daily. 90 tablet 3   losartan (COZAAR) 50 MG tablet Take 1 tablet (50 mg total) by mouth daily. 90 tablet 3   Polyvinyl Alcohol-Povidone (ARTIFICIAL TEARS) 5-6 MG/ML SOLN Place 1 drop into both eyes 3 (three) times daily as needed. 30 mL  5   pramipexole (MIRAPEX) 1 MG tablet Take 1 tablet (1 mg total) by mouth 3 (three) times daily. 90 tablet 5   pregabalin (LYRICA) 25 MG capsule Take 1 capsule (25 mg total) by mouth at bedtime. 30 capsule 0   verapamil (CALAN) 120 MG tablet Take 1 tablet (120 mg total) by mouth 2 (two) times daily. 180 tablet 3   hydrochlorothiazide (MICROZIDE) 12.5 MG capsule Take 1 capsule (12.5 mg total) by mouth daily. 90 capsule 3   FLUoxetine (PROZAC) 40 MG capsule Take 1 capsule (40 mg total) by mouth daily. (Patient not taking: Reported on 09/13/2022) 90 capsule 3   No facility-administered medications prior to visit.   Allergies  Allergen Reactions   Cortizone-10 [Hydrocortisone] Rash   Tramadol Nausea Only   Objective:   Today's Vitals   09/13/22 1020  BP: 118/74  Pulse: 88  Temp: 98.2 F (36.8 C)  TempSrc: Temporal  SpO2: 98%  Weight: 166 lb 12.8 oz (75.7 kg)  Height:  (1.651 m)   Body mass index is 27.76 kg/m.   General: Well developed, well nourished. No acute distress. Psych: Alert and oriented. Normal mood and affect.  Health Maintenance Due  Topic Date Due   COLONOSCOPY (Pts 45-35yrs Insurance coverage will need to be confirmed)  Never done   MAMMOGRAM  Never done     Assessment & Plan:   Problem List Items Addressed This Visit       Cardiovascular and Mediastinum   Essential hypertension - Primary    Blood pressure is in good control. She may be trending towards hypotension related to her PD or to the use of Sinemet. I will stop her diuretic, as this could also be a contributor to her polyuria. She will continue losartan 50 mg daily, verapamil 120 mg bid for now.      Relevant Orders   Basic metabolic panel     Endocrine   Hypothyroidism    I will reassess her TSH today. Continue levothyroxine 88 mcg for now.      Relevant Orders   TSH     Nervous and Auditory   Parkinson disease    Tremor improved on pramipexole 1 mg TID and Sinemet 25-100 mg 1/2 tab  TID.      Relevant Orders   AMB Referral to Managed Medicaid Care Management     Other   Radicular pain in left arm    Stable. Continue to work with physiatry.      Relevant Orders   AMB Referral to Managed Medicaid Care Management   Prediabetes    I will repeat her A1c and blood sugar today.      Relevant Orders   Hemoglobin A1c   Basic metabolic panel   Depression, major, single episode, moderate    Appears to be adjusting to her current health situation. We will proceed without medicine for now, but I will continue to check in  on this at her visits.      Other Visit Diagnoses     Other fatigue       Likely due to frequent urination at night disrupting sleep. I will assess for causes of this, but also recheck her thyroid to make sure she is not underdosed.   Relevant Orders   Basic metabolic panel   TSH   Vitamin B12   VITAMIN D 25 Hydroxy (Vit-D Deficiency, Fractures)   Urinary frequency       Etiology is unclear. I will assess for diabetes and for a possibel UTI to start with.   Relevant Orders   Urinalysis w microscopic + reflex cultur       Return in about 3 weeks (around 10/04/2022) for Reassessment.   [name], MD

## 2022-09-13 NOTE — Assessment & Plan Note (Signed)
Stable. Continue to work with physiatry.

## 2022-09-13 NOTE — Assessment & Plan Note (Signed)
Appears to be adjusting to her current health situation. We will proceed without medicine for now, but I will continue to check in on this at her visits.

## 2022-09-13 NOTE — Assessment & Plan Note (Signed)
I will reassess her TSH today. Continue levothyroxine 88 mcg for now.

## 2022-09-13 NOTE — Assessment & Plan Note (Signed)
Tremor improved on pramipexole 1 mg TID and Sinemet 25-100 mg 1/2 tab TID.

## 2022-09-13 NOTE — Assessment & Plan Note (Signed)
Blood pressure is in good control. She may be trending towards hypotension related to her PD or to the use of Sinemet. I will stop her diuretic, as this could also be a contributor to her polyuria. She will continue losartan 50 mg daily, verapamil 120 mg bid for now.

## 2022-09-14 DIAGNOSIS — E559 Vitamin D deficiency, unspecified: Secondary | ICD-10-CM | POA: Insufficient documentation

## 2022-09-14 DIAGNOSIS — E538 Deficiency of other specified B group vitamins: Secondary | ICD-10-CM | POA: Insufficient documentation

## 2022-09-14 LAB — URINALYSIS W MICROSCOPIC + REFLEX CULTURE

## 2022-09-14 MED ORDER — VITAMIN B-12 1000 MCG PO TABS
1000.0000 ug | ORAL_TABLET | Freq: Every day | ORAL | 3 refills | Status: DC
Start: 2022-09-14 — End: 2022-11-13

## 2022-09-14 MED ORDER — VITAMIN D (ERGOCALCIFEROL) 1.25 MG (50000 UNIT) PO CAPS
50000.0000 [IU] | ORAL_CAPSULE | ORAL | 1 refills | Status: DC
Start: 2022-09-14 — End: 2022-11-13

## 2022-09-14 NOTE — Addendum Note (Signed)
Addended by: [name] on: 09/14/2022 10:16 AM   Modules accepted: Orders

## 2022-09-15 ENCOUNTER — Other Ambulatory Visit: Payer: Medicaid Other | Admitting: Licensed Clinical Social Worker

## 2022-09-15 LAB — URINALYSIS W MICROSCOPIC + REFLEX CULTURE
Bilirubin Urine: NEGATIVE
Glucose, UA: NEGATIVE
Hyaline Cast: NONE SEEN /LPF
Ketones, ur: NEGATIVE
Nitrites, Initial: NEGATIVE
Protein, ur: NEGATIVE
Specific Gravity, Urine: 1.013 (ref 1.001–1.035)
pH: 7.5 (ref 5.0–8.0)

## 2022-09-15 LAB — CULTURE INDICATED

## 2022-09-15 LAB — URINE CULTURE
MICRO NUMBER:: 14869956
SPECIMEN QUALITY:: ADEQUATE

## 2022-09-15 NOTE — Patient Outreach (Signed)
Medicaid Managed Care Social Work Note  09/15/2022 Name:  Bianca Goodman MRN:  [id number] DOB:  Jan 14, 1961  Bianca Goodman is an 62 y.o. year old female who is a primary Goodman of Rudd, Bertram Millard, MD.  The Medicaid Managed Care Coordination team was consulted for assistance with:  Mental Health Counseling and Resources  Bianca Goodman was given information about Medicaid Managed Care Coordination team services today. Bianca Goodman agreed to services and verbal consent obtained.  Engaged with Goodman  for by telephone forinitial visit in response to referral for case management and/or care coordination services.   Assessments/Interventions:  Review of past medical history, allergies, medications, health status, including review of consultants reports, laboratory and other test data, was performed as part of comprehensive evaluation and provision of chronic care management services.  SDOH: (Social Determinant of Health) assessments and interventions performed: SDOH Interventions    Flowsheet Row Goodman Outreach Telephone from 09/15/2022 in [place] POPULATION HEALTH DEPARTMENT  SDOH Interventions   Transportation Interventions Intervention Not Indicated  Stress Interventions Offered [employer] Wellness Resources       Advanced Directives Status:  See Care Plan for related entries.  Care Plan                 Allergies  Allergen Reactions   Cortizone-10 [Hydrocortisone] Rash   Tramadol Nausea Only    Medications Reviewed Today     Reviewed by [name], MD (Physician) on 09/13/22 at 1301  Med List Status: <None>   Medication Order Taking? Sig Documenting Provider Last Dose Status Informant  carbidopa-levodopa (SINEMET IR) 25-100 MG tablet [id number] Yes Take 0.5 tablets by mouth 3 (three) times daily. Ihor Austin, NP Taking Active   Goodman not taking:  Discontinued 09/13/22 1100 (Goodman Preference)   Discontinued 09/13/22 1100 (Discontinued by provider)    HYDROcodone-acetaminophen (NORCO) 10-325 MG tablet [id number] Yes Take 1 tablet by mouth every 6 (six) hours as needed. [name], NP Taking Active   hydroxypropyl methylcellulose / hypromellose (ISOPTO TEARS / GONIOVISC) 2.5 % ophthalmic solution [id number] Yes Place 1 drop into both eyes 3 (three) times daily as needed for dry eyes. [name], MD Taking Active   levothyroxine (SYNTHROID) 88 MCG tablet [id number] Yes Take 1 tablet (88 mcg total) by mouth daily. [name], MD Taking Active   losartan (COZAAR) 50 MG tablet [id number] Yes Take 1 tablet (50 mg total) by mouth daily. [name], MD Taking Active   Polyvinyl Alcohol-Povidone (ARTIFICIAL TEARS) 5-6 MG/ML [name] [id number] Yes Place 1 drop into both eyes 3 (three) times daily as needed. [name], MD Taking Active   pramipexole (MIRAPEX) 1 MG tablet [id number] Yes Take 1 tablet (1 mg total) by mouth 3 (three) times daily. [name], MD Taking Active   pregabalin (LYRICA) 25 MG capsule [id number] Yes Take 1 capsule (25 mg total) by mouth at bedtime. [name], NP Taking Active   verapamil (CALAN) 120 MG tablet [id number] Yes Take 1 tablet (120 mg total) by mouth 2 (two) times daily. [name], MD Taking Active             Goodman Active Problem List   Diagnosis Date Noted   Vitamin D deficiency 09/14/2022   Vitamin B12 deficiency 09/14/2022   Parkinson disease 05/03/2021   Depression, major, single episode, moderate (HCC) 05/03/2021   Atypical squamous cell changes of undetermined significance (ASCUS) on cervical cytology with negative high risk human papilloma virus (HPV) test  result 11/09/2020   Essential hypertension 10/28/2020   Hypothyroidism 10/28/2020   History of endometriosis 10/28/2020   Prediabetes 10/28/2020   Radicular pain in left arm 09/14/2020    Conditions to be addressed/monitored per PCP order:   Parkinson's disease  Care Plan : LCSW Plan of Care  Updates made by Bianca Bryant, LCSW since 09/15/2022 12:00 AM     Problem: Quality of Life (General Plan of Care)      Goal: I need resource support   Start Date: 09/15/2022  Note:   Priority: High  Timeframe:  Long-Range Goal Priority:  High Start Date:   09/15/22        Expected End Date:  ongoing                     Follow Up Date--09/20/22 at 1 pm  Current barriers:   Parkinson's Disease Need for Disability Need for Financial Assistance and connection to available community resources Need for Bed (Goodman is experiencing back pain due to staying on an air mattress at her son's apartment)  Spanish speaking Goodman that needs language interpreter   Clinical Goals: Goodman will work with agencies discussed to address needs related to managing care and gaining desired services  Clinical Interventions:  Inter-disciplinary care team collaboration (see longitudinal plan of care) Assessment of needs, progress, barriers , and agencies contacted  Assessed need for disability by provider at last office visit  Assisted Goodman with education on how to complete disability enrollment application;  Clinical interventions provided:Solution-Focused Strategies, Active listening / Reflection utilized , Problem Solving [name] ,  Referral made for [facility] BSW for financial resource connection and assistance with finding a bed/mattress.  [facility] LCSW sent email to Goodman with resources  Email included the following resources: [place] in [place] Address: [address] [place], [place], [place] [address] Hours:  Open ? Closes 4?PM Phone: [phone]  [facility], [address] [place] [address] ([email or web address])  How To Apply For Social Security Disability Benefits ([email or web address])  Goodman Goals/Self-Care Activities: Over the next 30 days Complete Medicare application  Call Social Security Administration [phone] to follow up disability  application enrollment.  Goodman Goals: Initial goal     Follow up:  Goodman agrees to Care Plan and Follow-up.  Plan: The Managed Medicaid care management team will reach out to the Goodman again over the next 30 days.  Dickie La, BSW, MSW, [employer] Managed Medicaid LCSW [facility]  [facility] [email or web address] Phone: [phone]

## 2022-09-15 NOTE — Patient Instructions (Signed)
Visit Information  Bianca Goodman was given information about Medicaid Managed Care team care coordination services as a part of their [facility] Medicaid benefit. Bianca Goodman verbally consented to engagement with the [facility] Managed Care team.   If you are experiencing a medical emergency, please call 911 or report to your local emergency department or urgent care.   If you have a non-emergency medical problem during routine business hours, please contact your provider's office and ask to speak with a nurse.   For questions related to your [facility] health plan, please call: [phone] or go here:[email or web address]  If you would like to schedule transportation through your [facility] plan, please call the following number at least 2 days in advance of your appointment: [phone].   You can also use the MTM portal or MTM mobile app to manage your rides. Reimbursement for transportation is available through [facility]! For the portal, please go to [email or web address]  Call the [facility] Crisis Line at [phone], at any time, 24 hours a day, 7 days a week. If you are in danger or need immediate medical attention call 911.  If you would like help to quit smoking, call 1-800-QUIT-NOW (812-712-0685) OR Espaol: 1-855-Djelo-Ya (4-132-440-1027) o para ms informacin haga clic aqu or Text READY to 253-664 to register via text   Following is a copy of your plan of care:  Care Plan : LCSW Plan of Care  Updates made by Bianca Bryant, LCSW since 09/15/2022 12:00 AM     Problem: Quality of Life (General Plan of Care)      Goal: I need resource support   Start Date: 09/15/2022  Note:   Priority: High  Timeframe:  Long-Range Goal Priority:  High Start Date:   09/15/22        Expected End Date:  ongoing                     Follow Up Date--09/20/22 at 1 pm  Current barriers:   Parkinson's Disease Need for Disability Need for Financial Assistance and connection  to available community resources Need for Bed (patient is experiencing back pain due to staying on an air mattress at her son's apartment)  Spanish speaking patient that needs language interpreter   Clinical Goals: Patient will work with agencies discussed to address needs related to managing care and gaining desired services  Clinical Interventions:  Inter-disciplinary care team collaboration (see longitudinal plan of care) Assessment of needs, progress, barriers , and agencies contacted  Assessed need for disability by provider at last office visit  Assisted patient with education on how to complete disability enrollment application;  Clinical interventions provided:Solution-Focused Strategies, Active listening / Reflection utilized , Problem Solving [facility] ,  Referral made for [facility] BSW for financial resource connection and assistance with finding a bed/mattress.  [facility] LCSW sent email to patient with resources  Email included the following resources: Social security office in [place], [place] Address: [address] [place], [place], [place] [address] Hours:  Open ? Closes 4?PM Phone: [phone]  [facility], [address] [place] [address] ([email or web address])  How To Apply For Social Security Disability Benefits ([email or web address])  Patient Goals/Self-Care Activities: Over the next 30 days Complete Medicare application  Call Social Security Administration [phone] to follow up disability application enrollment.  Patient Goals: Initial goal

## 2022-09-20 ENCOUNTER — Other Ambulatory Visit: Payer: Medicaid Other | Admitting: Licensed Clinical Social Worker

## 2022-09-20 NOTE — Patient Instructions (Addendum)
Visit Information  Bianca Goodman was given information about Medicaid Managed Care team care coordination services as a part of their [facility] Medicaid benefit. Bianca Goodman verbally consented to engagement with the [facility] Managed Care team.   If you are experiencing a medical emergency, please call 911 or report to your local emergency department or urgent care.   If you have a non-emergency medical problem during routine business hours, please contact your provider's office and ask to speak with a nurse.   For questions related to your [facility] health plan, please call: [phone] or go here:[email or web address]  If you would like to schedule transportation through your [facility] plan, please call the following number at least 2 days in advance of your appointment: [phone].   You can also use the MTM portal or MTM mobile app to manage your rides. Reimbursement for transportation is available through [facility]! For the portal, please go to [email or web address]  Call the [facility] Crisis Line at [phone], at any time, 24 hours a day, 7 days a week. If you are in danger or need immediate medical attention call 911.  If you would like help to quit smoking, call 1-800-QUIT-NOW (727-611-7183) OR Espaol: 1-855-Djelo-Ya (4-132-440-1027) o para ms informacin haga clic aqu or Text READY to 253-664 to register via text  Following is a copy of your plan of care:  Care Plan : LCSW Plan of Care  Updates made by [name], LCSW since 09/20/2022 12:00 AM     Problem: Quality of Life (General Plan of Care)      Goal: I need resource support   Start Date: 09/15/2022  Note:   Priority: High  Timeframe:  Long-Range Goal Priority:  High Start Date:   09/15/22        Expected End Date:  ongoing                     Follow Up Date--10/18/22 at 1 pm  Current barriers:   Parkinson's Disease Need for Disability Need for Financial Assistance and connection  to available community resources Need for Bed (patient is experiencing back pain due to staying on an air mattress at her son's apartment)  Spanish speaking patient that needs language interpreter   Clinical Goals: Patient will work with agencies discussed to address needs related to managing care and gaining desired services  Patient Goals: Follow up goal  10 LITTLE Things To Do When You're Feeling Too Down To Do Anything  Take a shower. Even if you plan to stay in all day long and not see a soul, take a shower. It takes the most effort to hop in to the shower but once you do, you'll feel immediate results. It will wake you up and you'll be feeling much fresher (and cleaner too).  Brush and floss your teeth. Give your teeth a good brushing with a floss finish. It's a small task but it feels so good and you can check 'taking care of your health' off the list of things to do.  Do something small on your list. Most of [place] have some small thing we would like to get done (load of laundry, sew a button, email a friend). Doing one of these things will make you feel like you've accomplished something.  Drink water. Drinking water is easy right? It's also really beneficial for your health so keep a glass beside you all day and take sips often. It gives you energy and prevents you from boredom  eating.  Do some floor exercises. The last thing you want to do is exercise but it might be just the thing you need the most. Keep it simple and do exercises that involve sitting or laying on the floor. Even the smallest of exercises release chemicals in the brain that make you feel good. Yoga stretches or core exercises are going to make you feel good with minimal effort.  Make your bed. Making your bed takes a few minutes but it's productive and you'll feel relieved when it's done. An unmade bed is a huge visual reminder that you're having an unproductive day. Do it and consider it your housework for the  day.  Put on some nice clothes. Take the sweatpants off even if you don't plan to go anywhere. Put on clothes that make you feel good. Take a look in the mirror so your brain recognizes the sweatpants have been replaced with clothes that make you look great. It's an instant confidence booster.  Wash the dishes. A pile of dirty dishes in the sink is a reflection of your mood. It's possible that if you wash up the dishes, your mood will follow suit. It's worth a try.  Cook a real meal. If you have the luxury to have a "do nothing" day, you have time to make a real meal for yourself. Make a meal that you love to eat. The process is good to get you out of the funk and the food will ensure you have more energy for tomorrow.  Write out your thoughts by hand. When you hand write, you stimulate your brain to focus on the moment that you're in so make yourself comfortable and write whatever comes into your mind. Put those thoughts out on paper so they stop spinning around in your head. Those thoughts might be the very thing holding you down.

## 2022-09-20 NOTE — Patient Outreach (Addendum)
Medicaid Managed Care Social Work Note  09/20/2022 Name:  Bianca Goodman MRN:  [id number] DOB:  1961-03-09  Bianca Goodman is an 62 y.o. year old female who is a primary patient of Rudd, Bertram Millard, MD.  The Medicaid Managed Care Coordination team was consulted for assistance with:  Community Resources   Bianca Goodman was given information about Medicaid Managed Care Coordination team services today. Bianca Goodman Patient agreed to services and verbal consent obtained.  Engaged with patient  for by telephone forfollow up visit in response to referral for case management and/or care coordination services.   Assessments/Interventions:  Review of past medical history, allergies, medications, health status, including review of consultants reports, laboratory and other test data, was performed as part of comprehensive evaluation and provision of chronic care management services.  SDOH: (Social Determinant of Health) assessments and interventions performed: SDOH Interventions    Flowsheet Row Patient Outreach Telephone from 09/20/2022 in [place] POPULATION HEALTH DEPARTMENT Patient Outreach Telephone from 09/15/2022 in [place] POPULATION HEALTH DEPARTMENT  SDOH Interventions    Transportation Interventions -- Intervention Not Indicated  Stress Interventions Offered [name] Resources, Provide Counseling Offered Community Wellness Resources       Advanced Directives Status:  See Care Plan for related entries.  Care Plan                 Allergies  Allergen Reactions   Cortizone-10 [Hydrocortisone] Rash   Tramadol Nausea Only    Medications Reviewed Today     Reviewed by Bianca Mast, MD (Physician) on 09/13/22 at 1301  Med List Status: <None>   Medication Order Taking? Sig Documenting Provider Last Dose Status Informant  carbidopa-levodopa (SINEMET IR) 25-100 MG tablet [id number] Yes Take 0.5 tablets by mouth 3 (three) times daily. Bianca Goodman Taking Active   Patient not  taking:  Discontinued 09/13/22 1100 (Patient Preference)   Discontinued 09/13/22 1100 (Discontinued by provider)   HYDROcodone-acetaminophen (NORCO) 10-325 MG tablet [id number] Yes Take 1 tablet by mouth every 6 (six) hours as needed. Bianca Bales, Goodman Taking Active   hydroxypropyl methylcellulose / hypromellose (ISOPTO TEARS / GONIOVISC) 2.5 % ophthalmic solution [id number] Yes Place 1 drop into both eyes 3 (three) times daily as needed for dry eyes. Bianca Mast, MD Taking Active   levothyroxine (SYNTHROID) 88 MCG tablet [id number] Yes Take 1 tablet (88 mcg total) by mouth daily. Bianca Mast, MD Taking Active   losartan (COZAAR) 50 MG tablet [id number] Yes Take 1 tablet (50 mg total) by mouth daily. Bianca Mast, MD Taking Active   Polyvinyl Alcohol-Povidone (ARTIFICIAL TEARS) 5-6 MG/ML Bianca Goodman [id number] Yes Place 1 drop into both eyes 3 (three) times daily as needed. Bianca Mast, MD Taking Active   pramipexole (MIRAPEX) 1 MG tablet [id number] Yes Take 1 tablet (1 mg total) by mouth 3 (three) times daily. Bianca Foley, MD Taking Active   pregabalin (LYRICA) 25 MG capsule [id number] Yes Take 1 capsule (25 mg total) by mouth at bedtime. Bianca Bales, Goodman Taking Active   verapamil (CALAN) 120 MG tablet [id number] Yes Take 1 tablet (120 mg total) by mouth 2 (two) times daily. Bianca Mast, MD Taking Active             Patient Active Problem List   Diagnosis Date Noted   Vitamin D deficiency 09/14/2022   Vitamin B12 deficiency 09/14/2022   Parkinson disease 05/03/2021   Depression, major, single episode, moderate (HCC) 05/03/2021   Atypical squamous  cell changes of undetermined significance (ASCUS) on cervical cytology with negative high risk human papilloma virus (HPV) test result 11/09/2020   Essential hypertension 10/28/2020   Hypothyroidism 10/28/2020   History of endometriosis 10/28/2020   Prediabetes 10/28/2020   Radicular pain in left arm 09/14/2020    Care Plan : Goodman  Plan of Care  Updates made by Bianca Goodman since 09/20/2022 12:00 AM     Problem: Quality of Life (General Plan of Care)      Goal: I need resource support   Start Date: 09/15/2022  Note:   Priority: High  Timeframe:  Long-Range Goal Priority:  High Start Date:   09/15/22        Expected End Date:  ongoing                     Follow Up Date--10/18/22 at 1 pm  Current barriers:   Parkinson's Disease Need for Disability Need for Financial Assistance and connection to available community resources Need for Bed (patient is experiencing back pain due to staying on an air mattress at her son's apartment)  Spanish speaking patient that needs language interpreter   Clinical Goals: Patient will work with agencies discussed to address needs related to managing care and gaining desired services  Clinical Interventions:  Inter-disciplinary care team collaboration (see longitudinal plan of care) Assessment of needs, progress, barriers , and agencies contacted  Assessed need for disability by provider at last office visit  Assisted patient with education on how to complete disability enrollment application;  Clinical interventions provided:Solution-Focused Strategies, Active listening / Reflection utilized , Problem Solving Bianca Goodman Goodman ,  Referral made for [facility] Bianca Goodman for financial resource connection and assistance with finding a bed/mattress.  Crisis support resource education provided  [facility] Goodman sent email to patient with resources. 09/20/22 update- patient reports to Bianca Goodman that she is unable to find email that Bianca Goodman sent last week. Email was sent again but patient reports not seeing email even after confirming email address several times. Bianca Goodman had patient physically write down [employer] address, website and phone number.   Email included the following resources: Tree surgeon office in [place], [place] Address: [address] [place],  [place], [place] [address] Hours:  Open ? Closes 4?PM Phone: [phone]  [facility], [address] [place] [address] ([email or web address])  How To Apply For Social Security Disability Benefits ([email or web address])  Patient Goals/Self-Care Activities: Over the next 30 days Complete Medicare application  Call Social Security Administration [phone] to follow up disability application enrollment.  Patient Goals: Follow up goal    [phone] and [phone] crisis resource number wrote down by patient as well.  Follow up:  Patient agrees to Care Plan and Follow-up.  Plan: The Managed Medicaid care management team will reach out to the patient again over the next 30 days.  Dickie La, Bianca Goodman, MSW, [employer] Managed Medicaid [facility]  [email or web address] Phone: [phone]

## 2022-09-22 ENCOUNTER — Telehealth: Payer: Self-pay | Admitting: *Deleted

## 2022-09-22 NOTE — Telephone Encounter (Signed)
Urine drug screen for this encounter is consistent for prescribed medication 

## 2022-09-25 ENCOUNTER — Other Ambulatory Visit: Payer: Medicaid Other

## 2022-09-25 NOTE — Patient Outreach (Signed)
Medicaid Managed Care Social Work Note  09/25/2022 Name:  Bianca Goodman MRN:  [id number] DOB:  10/20/1960  Bianca Goodman Orders is an 62 y.o. year old female who is a primary patient of Rudd, Bertram Millard, MD.  The [facility] Managed Care Coordination team was consulted for assistance with:   furniture, bed  Ms. Steenhoek was given information about Medicaid Managed Care Coordination team services today. [name] Patient agreed to services and verbal consent obtained.  Engaged with patient  for by telephone forinitial visit in response to referral for case management and/or care coordination services.   Assessments/Interventions:  Review of past medical history, allergies, medications, health status, including review of consultants reports, laboratory and other test data, was performed as part of comprehensive evaluation and provision of chronic care management services.  SDOH: (Social Determinant of Health) assessments and interventions performed: SDOH Interventions    Flowsheet Row Patient Outreach Telephone from 09/20/2022 in [place] POPULATION HEALTH DEPARTMENT Patient Outreach Telephone from 09/15/2022 in [place] POPULATION HEALTH DEPARTMENT  SDOH Interventions    Transportation Interventions -- Intervention Not Indicated  Stress Interventions Offered [name] Resources, Provide Counseling Offered Community Wellness Resources     BSW completed a telephone outreach with patient using spanish interpreter, she states she is sleeping on an air mattress and it is painful. She has not tried going to any thrift stores because she does not have any income. She does receive foodstamps and son is paying utilities and rent. Patient states her son is able to read english. BSW will mail patient resources that may be able to assist with getting a bed.   Advanced Directives Status:  Not addressed in this encounter.  Care Plan                 Allergies  Allergen Reactions   Cortizone-10  [Hydrocortisone] Rash   Tramadol Nausea Only    Medications Reviewed Today     Reviewed by [name], MD (Physician) on 09/13/22 at 1301  Med List Status: <None>   Medication Order Taking? Sig Documenting Provider Last Dose Status Informant  carbidopa-levodopa (SINEMET IR) 25-100 MG tablet [id number] Yes Take 0.5 tablets by mouth 3 (three) times daily. Ihor Austin, NP Taking Active   Patient not taking:  Discontinued 09/13/22 1100 (Patient Preference)   Discontinued 09/13/22 1100 (Discontinued by provider)   HYDROcodone-acetaminophen (NORCO) 10-325 MG tablet [id number] Yes Take 1 tablet by mouth every 6 (six) hours as needed. [name], NP Taking Active   hydroxypropyl methylcellulose / hypromellose (ISOPTO TEARS / GONIOVISC) 2.5 % ophthalmic solution [id number] Yes Place 1 drop into both eyes 3 (three) times daily as needed for dry eyes. [name], MD Taking Active   levothyroxine (SYNTHROID) 88 MCG tablet [id number] Yes Take 1 tablet (88 mcg total) by mouth daily. [name], MD Taking Active   losartan (COZAAR) 50 MG tablet [id number] Yes Take 1 tablet (50 mg total) by mouth daily. [name], MD Taking Active   Polyvinyl Alcohol-Povidone (ARTIFICIAL TEARS) 5-6 MG/ML [name] [id number] Yes Place 1 drop into both eyes 3 (three) times daily as needed. [name], MD Taking Active   pramipexole (MIRAPEX) 1 MG tablet [id number] Yes Take 1 tablet (1 mg total) by mouth 3 (three) times daily. [name], MD Taking Active   pregabalin (LYRICA) 25 MG capsule [id number] Yes Take 1 capsule (25 mg total) by mouth at bedtime. [name], NP Taking Active   verapamil (CALAN) 120 MG  tablet [id number] Yes Take 1 tablet (120 mg total) by mouth 2 (two) times daily. [name], MD Taking Active             Patient Active Problem List   Diagnosis Date Noted   Vitamin D deficiency 09/14/2022   Vitamin B12 deficiency 09/14/2022   Parkinson disease 05/03/2021    Depression, major, single episode, moderate (HCC) 05/03/2021   Atypical squamous cell changes of undetermined significance (ASCUS) on cervical cytology with negative high risk human papilloma virus (HPV) test result 11/09/2020   Essential hypertension 10/28/2020   Hypothyroidism 10/28/2020   History of endometriosis 10/28/2020   Prediabetes 10/28/2020   Radicular pain in left arm 09/14/2020    Conditions to be addressed/monitored per PCP order:   furniture  Care Plan : LCSW Plan of Care  Updates made by Shaune Leeks since 09/25/2022 12:00 AM     Problem: Quality of Life (General Plan of Care)      Goal: I need resource support   Start Date: 09/15/2022  Note:   Priority: High  Timeframe:  Long-Range Goal Priority:  High Start Date:   09/15/22        Expected End Date:  ongoing                     Follow Up Date--09/20/22 at 1 pm  Current barriers:   Parkinson's Disease Need for Disability Need for Financial Assistance and connection to available community resources Need for Bed (patient is experiencing back pain due to staying on an air mattress at her son's apartment)  Spanish speaking patient that needs language interpreter   Clinical Goals: Patient will work with agencies discussed to address needs related to managing care and gaining desired services  Clinical Interventions:  Inter-disciplinary care team collaboration (see longitudinal plan of care) Assessment of needs, progress, barriers , and agencies contacted  Assessed need for disability by provider at last office visit  Assisted patient with education on how to complete disability enrollment application;  Clinical interventions provided:Solution-Focused Strategies, Active listening / Reflection utilized , Problem Solving [name] ,  Referral made for [facility] BSW for financial resource connection and assistance with finding a bed/mattress.  Crisis support resource education provided  [facility] LCSW sent email to  patient with resources. 09/20/22 update- patient reports to [facility] that she is unable to find email that [facility] LCSW sent last week. Email was sent again but patient reports not seeing email even after confirming email address several times. [facility] LCSW had patient physically write down [employer] address, website and phone number.   Email included the following resources: Tree surgeon office in [place], [place] Address: [address] [place], [place], [place] [address] Hours:  Open ? Closes 4?PM Phone: [phone]  [facility], [address] [place] [address] ([email or web address])  How To Apply For Social Security Disability Benefits ([email or web address])  BSW completed a telephone outreach with patient using spanish interpreter, she states she is sleeping on an air mattress and it is painful. She has not tried going to any thrift stores because she does not have any income. She does receive foodstamps and son is paying utilities and rent. Patient states her son is able to read english. BSW will mail patient resources that may be able to assist with getting a bed.   Patient Goals/Self-Care Activities: Over the next 30 days Complete Medicare application  Call Social Security Administration [phone] to follow up  disability application enrollment.  Patient Goals: Follow up goal     Follow up:  Patient agrees to Care Plan and Follow-up.  Plan: The Managed Medicaid care management team will reach out to the patient again over the next 30 days.  Date/time of next scheduled Social Work care management/care coordination outreach:  10/26/22 Gus Puma, Kenard Gower, [facility]  Managed [facility] Social Worker [phone]

## 2022-09-25 NOTE — Patient Instructions (Signed)
Visit Information  [name] was given information about Medicaid Managed Care Goodman care coordination services as a part of their [facility] Medicaid benefit. Bianca Goodman verbally consented to engagement with the Bianca Goodman.   If you are experiencing a medical emergency, please call 911 or report to your local emergency department or urgent care.   If you have a non-emergency medical problem during routine business hours, please contact your provider's office and ask to speak with a nurse.   For questions related to your Bianca Goodman health plan, please call: [phone] or go here:[email or web address]  If you would like to schedule transportation through your Bianca Goodman plan, please call the following number at least 2 days in advance of your appointment: [phone].   You can also use the MTM portal or MTM mobile app to manage your rides. Reimbursement for transportation is available through Bianca Goodman! For the portal, please go to [email or web address]  Call the Bianca Goodman [facility] Crisis Line at [phone], at any time, 24 hours a day, 7 days a week. If you are in danger or need immediate medical attention call 911.  If you would like help to quit smoking, call 1-800-QUIT-NOW (2515093578) OR Espaol: 1-855-Djelo-Ya (7-425-956-3875) o para ms informacin haga clic aqu or Text READY to 643-329 to register via text  Bianca Goodman - following are the goals we discussed in your visit today:   Goals Addressed   None      Social Worker will follow up on 10/26/22.   Bianca Goodman, Bianca Goodman, MHA [facility]  Managed Medicaid Social Worker [phone]   Following is a copy of your plan of care:  Care Plan : LCSW Plan of Care  Updates made by Bianca Goodman since 09/25/2022 12:00 AM     Problem: Quality of Life (General Plan of Care)      Goal: I need resource support   Start Date: 09/15/2022  Note:   Priority: High  Timeframe:  Long-Range  Goal Priority:  High Start Date:   09/15/22        Expected End Date:  ongoing                     Follow Up Date--09/20/22 at 1 pm  Current barriers:   Parkinson's Disease Need for Disability Need for Financial Assistance and connection to available community resources Need for Bed (patient is experiencing back pain due to staying on an air mattress at her son's apartment)  Spanish speaking patient that needs language interpreter   Clinical Goals: Patient will work with agencies discussed to address needs related to managing care and gaining desired services  Clinical Interventions:  Inter-disciplinary care Goodman collaboration (see longitudinal plan of care) Assessment of needs, progress, barriers , and agencies contacted  Assessed need for disability by provider at last office visit  Assisted patient with education on how to complete disability enrollment application;  Clinical interventions provided:Solution-Focused Strategies, Active listening / Reflection utilized , Problem Solving Bianca Goodman ,  Referral made for [place] [facility] BSW for financial resource connection and assistance with finding a bed/mattress.  Crisis support resource education provided  [facility] LCSW sent email to patient with resources. 09/20/22 update- patient reports to Bianca Goodman that she is unable to find email that [facility] LCSW sent last week. Email was sent again but patient reports not seeing email even after confirming email address several times. [facility] LCSW had patient physically write down [employer] address, website and phone number.  Email included the following resources: Tree surgeon office in [place], [place] Address: [address] [place], Bianca Goodman, [place] [address] Hours:  Open ? Closes 4?PM Phone: [phone]  [facility], [address] [place] [address] ([email or web address])  How To Apply For Social Security Disability Benefits ([email or web address])  BSW  completed a telephone outreach with patient using spanish interpreter, she states she is sleeping on an air mattress and it is painful. She has not tried going to any thrift stores because she does not have any income. She does receive foodstamps and son is paying utilities and rent. Patient states her son is able to read english. BSW will mail patient resources that may be able to assist with getting a bed.   Patient Goals/Self-Care Activities: Over the next 30 days Complete Medicare application  Call Social Security Administration [phone] to follow up disability application enrollment.  Patient Goals: Follow up goal

## 2022-09-27 ENCOUNTER — Encounter: Payer: Self-pay | Admitting: Neurology

## 2022-09-27 ENCOUNTER — Ambulatory Visit: Payer: Medicaid Other | Admitting: Neurology

## 2022-09-27 VITALS — BP 152/98 | HR 105 | Ht 62.0 in | Wt 164.0 lb

## 2022-09-27 DIAGNOSIS — G20A2 Parkinson's disease without dyskinesia, with fluctuations: Secondary | ICD-10-CM

## 2022-09-27 DIAGNOSIS — Z9181 History of falling: Secondary | ICD-10-CM | POA: Diagnosis not present

## 2022-09-27 DIAGNOSIS — G20C Parkinsonism, unspecified: Secondary | ICD-10-CM

## 2022-09-27 MED ORDER — CARBIDOPA-LEVODOPA 25-100 MG PO TABS: 1.0000 | ORAL_TABLET | Freq: Four times a day (QID) | ORAL | 3 refills | Status: DC

## 2022-09-27 MED ORDER — PRAMIPEXOLE DIHYDROCHLORIDE 1 MG PO TABS
1.0000 mg | ORAL_TABLET | Freq: Three times a day (TID) | ORAL | 3 refills | Status: DC
Start: 2022-09-27 — End: 2023-12-03

## 2022-09-27 NOTE — Patient Instructions (Addendum)
We will keep your Mirapex at 1 mg 3 times a day, at 8:30, 12:30 and 8:30 PM daily.   We will increase your Sinemet to 1 pill 4 times a day at 8:30, 12:30, 4:30 PM and 8:30 PM daily.   You can use a cane for gait safety.  Follow up in 6 months with Shanda Bumps.

## 2022-09-27 NOTE — Progress Notes (Signed)
Subjective:    Patient ID: Bianca Goodman is a 62 y.o. female.  HPI    Interim history:   Bianca Goodman is a 62 year old right-handed woman with an underlying medical history of chronic neck pain, on chronic narcotic pain medication, hypertension, hypothyroidism, and mildly overweight state, who presents for follow-up consultation of her parkinsonism.  The patient is accompanied by a Spanish interpreter today. I last saw her on 02/14/2022, at which time she reported doing about the same, she was tolerating pramipexole at 1-1/2 pills 3 times a day, taken at 7 AM, 2 PM and 9:30 or 10 PM.  She was also on pain medication and gabapentin. She was on Prozac. She was advised to increase her pramipexole to 1 mg 3 times daily.  For residual depression, she was advised to follow-up with PCP.  She saw Ihor Austin, NP on 06/28/2022, at which time she reported intermittent freezing and worsening tremors.  She was advised to start Sinemet 25-100 mg strength half a pill 3 times daily with gradual increase to 1 pill 3 times daily in about a month.  She was advised to continue with her pramipexole 1 mg 3 times daily.  She was encouraged to follow-up with PCP for residual depression.  Today, 09/27/2022: She reports having had more stiffness and trembling in the left hand, but it is intermittent, some days are good and some days are significantly worse, no obvious rhyme or reason, has trigger finger in her left hand ring finger.  She has used an over-the-counter splint which seems to help.  Her blood pressure is elevated today but she reports headache and ongoing neck pain, she reports that she was considered for neck injection but since she has an allergy to cortisone she cannot get it done.  She has fallen about a couple of months ago, she landed on her right side, no serious injury but had a bruise and she used an ice pack.  She feels like she could fall forward or backward if she bends over or if she leans back to drink  water.  Sometimes she feels that there is something stuck in her throat or it is hard to swallow but has had no choking episode except for 1 time where she felt she was choking, no coughing while drinking water.  She makes sure she is upright when she eats or drinks.  She tolerates the medication, takes the pramipexole and levodopa together 3 times a day, 8:30 AM, 3 PM and around 10 PM daily.  She would be willing to increase the levodopa.   The patient's allergies, current medications, family history, past medical history, past social history, past surgical history and problem list were reviewed and updated as appropriate.    Previously:   I saw her on 06/29/2021, at which time she reported tolerating pramipexole and low-dose.  She was taking 0.375 mg 3 times daily.  She had noticed some benefit with regards to improvement in her stiffness and fine motor control.  She was advised to increase her pramipexole to 0.5 mg 3 times daily and eventually to 0.75 mg 3 times daily.     I first met her at the request of her pain management nurse practitioner on 02/21/2021, at which time she reported a several month history of a tremor affecting her left upper extremity.  In addition, she had noticed stiffness and pain in her hands, fine motor dyscontrol and overall slowness in her movements.  She had moved from [place] recently.  She was noted to have parkinsonian features.  I asked her to start a dopamine agonist, namely pramipexole in low-dose with gradual titration.   The patient recently saw her pain management provider on 06/08/2021.  She has a follow-up this month.  She saw her primary care in December on 05/03/2021 and reported a modest reduction in her tremor in the left hand side. She was started on Prozac for depression.     02/21/21: (She) reports an approximately 78-month history of tremors affecting her left upper extremity.  In addition, she has noticed stiffness and pain in her hands, inability to fully  extend or close her hand, she has had slowness and changes in her walk, she feels slower, sometimes she feels "stupid".  She moved from [place] some 7 or 8 months ago with her son.  She lives with her only son and her sister.  She has a total of 1 sister and 3 brothers, none of whom have tremors, no family history of Parkinson's disease.  She fell about a month ago in the parking lot at the doctor's office.  She hit her lower back, she did not hit her head or passed out thankfully.  She had fallen some years ago, she was still residing in [place] at the time, she was on [place] at the time.  She has chronic neck pain, she has had neck problems since 2005, she is status post ACDF in 2005.   She had a recent cervical spine MRI without contrast on 11/13/2020 and I reviewed the results: IMPRESSION: 1. Disc bulge with uncovertebral spurring at C6-7 with resultant moderate left C7 foraminal stenosis. This is suspected to be the symptomatic level. 2. Degenerative disc bulge with uncovertebral spurring at C5-6 with resultant mild to moderate left and mild right C6 foraminal stenosis. 3. Broad-based disc bulge at C3-4 with resultant mild spinal stenosis. 4. Prior ACDF at C4-5 without residual or recurrent stenosis.   I reviewed your office note from 11/23/2020.    She is a non-smoker and does not drink alcohol, she limits her caffeine to 1 cup of coffee in the morning.  She hydrates well with water.   She is widowed, she is not currently working.  She has worked in [employer] before.  She is always been independent.  She gets tearful towards the end of our visit as we discussed her possible diagnosis.  She indicates that her sister-in-law who is a physician in the [place], indicated to her that she may have Parkinson's disease and she did not believe her sister-in-law.   Her Past Medical History Is Significant For: Past Medical History:  Diagnosis Date   Endometriosis    Hypertension     Migraines    Parkinson's disease    Thyroid disease     Her Past Surgical History Is Significant For: Past Surgical History:  Procedure Laterality Date   APPENDECTOMY     1997   CERVICAL SPINE SURGERY     C4-5 spinal fusion   RIGHT OOPHORECTOMY     scalenectomy     1985    Her Family History Is Significant For: Family History  Problem Relation Age of Onset   Cancer Mother    Heart disease Father    Heart disease Brother    Heart attack Brother        stent placement    Her Social History Is Significant For: Social History   Socioeconomic History   Marital status: Widowed    Spouse  name: Not on file   Number of children: 1   Years of education: Not on file   Highest education level: Not on file  Occupational History   Not on file  Tobacco Use   Smoking status: Former    Types: Cigarettes    Quit date: 09/20/2019    Years since quitting: 3.0   Smokeless tobacco: Never  Vaping Use   Vaping Use: Never used  Substance and Sexual Activity   Alcohol use: Not Currently   Drug use: Never   Sexual activity: Yes  Other Topics Concern   Not on file  Social History Narrative   Caffeine one cup daily, coffee with milk.   Lives with son.  Widow.  Education HS.     Social Determinants of Health   Financial Resource Strain: Medium Risk (09/25/2022)   Overall Financial Resource Strain (CARDIA)    Difficulty of Paying Living Expenses: Somewhat hard  Food Insecurity: No Food Insecurity (09/25/2022)   Hunger Vital Sign    Worried About Running Out of Food in the Last Year: Never true    Ran Out of Food in the Last Year: Never true  Transportation Needs: No Transportation Needs (09/15/2022)   PRAPARE - Administrator, Civil Service (Medical): No    Lack of Transportation (Non-Medical): No  Physical Activity: Not on file  Stress: Stress Concern Present (09/20/2022)   [employer] of Occupational Health - Occupational Stress Questionnaire    Feeling of Stress :  To some extent  Social Connections: Not on file    Her Allergies Are:  Allergies  Allergen Reactions   Cortizone-10 [Hydrocortisone] Rash   Tramadol Nausea Only  :   Her Current Medications Are:  Outpatient Encounter Medications as of 09/27/2022  Medication Sig   carbidopa-levodopa (SINEMET IR) 25-100 MG tablet Take 0.5 tablets by mouth 3 (three) times daily.   cyanocobalamin (VITAMIN B12) 1000 MCG tablet Take 1 tablet (1,000 mcg total) by mouth daily.   HYDROcodone-acetaminophen (NORCO) 10-325 MG tablet Take 1 tablet by mouth every 6 (six) hours as needed.   hydroxypropyl methylcellulose / hypromellose (ISOPTO TEARS / GONIOVISC) 2.5 % ophthalmic solution Place 1 drop into both eyes 3 (three) times daily as needed for dry eyes.   levothyroxine (SYNTHROID) 88 MCG tablet Take 1 tablet (88 mcg total) by mouth daily.   losartan (COZAAR) 50 MG tablet Take 1 tablet (50 mg total) by mouth daily.   Polyvinyl Alcohol-Povidone (ARTIFICIAL TEARS) 5-6 MG/ML SOLN Place 1 drop into both eyes 3 (three) times daily as needed.   pramipexole (MIRAPEX) 1 MG tablet Take 1 tablet (1 mg total) by mouth 3 (three) times daily.   pregabalin (LYRICA) 25 MG capsule Take 1 capsule (25 mg total) by mouth at bedtime.   verapamil (CALAN) 120 MG tablet Take 1 tablet (120 mg total) by mouth 2 (two) times daily.   Vitamin D, Ergocalciferol, (DRISDOL) 1.25 MG (50000 UNIT) CAPS capsule Take 1 capsule (50,000 Units total) by mouth every 7 (seven) days.   No facility-administered encounter medications on file as of 09/27/2022.  :  Review of Systems:  Out of a complete 14 point review of systems, all are reviewed and negative with the exception of these symptoms as listed below:   Review of Systems  Neurological:        Patient is here with interpreter Arvil Persons for Parkinsonism follow-up. Patient states her symptoms are worse. She is asking if a cane is recommended because  it is harder to stand and she does not want to  fall. Her tremor is worse. Patient states there are some times where she has 3 days of no tremor and then her symptoms return and her hands stiffen and her fingers are hard to separate. She states she googled her symptoms and trigger finger came up. She has tried a hand brace/splint for this and it helps.     Objective:  Neurological Exam  Physical Exam Physical Examination:   Vitals:   09/27/22 0957 09/27/22 1026  BP: (!) 152/97 (!) 152/98  Pulse: (!) 107 (!) 105    General Examination: The patient is a very pleasant 62 y.o. female in no acute distress. She appears well-developed and well-nourished and well groomed.   HEENT: Normocephalic, atraumatic, pupils are equal, round and reactive to light, extraocular tracking is fairly well-preserved, face is symmetric with mild to moderate facial masking, mild nuchal rigidity, mild to moderate limitation to neck turns to both sides.  Hearing grossly intact.  She has no lip, neck or jaw tremor.  She has no significant hypophonia, no dysarthria or voice tremor.  Airway examination reveals mild mouth dryness, otherwise stable findings.  Tongue protrudes centrally and palate elevates symmetrically.     Chest: Clear to auscultation without wheezing, rhonchi or crackles noted.   Heart: S1+S2+0, regular and normal without murmurs, rubs or gallops noted.    Abdomen: Soft, non-tender and non-distended.   Extremities: There is no pitting edema in the distal lower extremities bilaterally.    Skin: Warm and dry without trophic changes noted.    Musculoskeletal: exam reveals no obvious joint deformities, with the exception of limited range of motion in the neck, left hand trigger finger in the ring finger.     Neurologically:  Mental status: The patient is awake, alert and oriented in all 4 spheres. Her immediate and remote memory, attention, language skills and fund of knowledge are appropriate. There is no evidence of aphasia, agnosia, apraxia or  anomia. Speech is clear with normal prosody and enunciation. Thought process is linear. Mood is normal and affect is normal.  Cranial nerves II - XII are as described above under HEENT exam.  Her right shoulder is mildly higher than left at baseline.     (On 02/21/2021: On Archimedes spiral drawing she was unable to do the task with the left hand, with the right hand there was no trembling.  Handwriting with the right hand was legible, not micrographic, not tremulous.)   Motor exam: Normal bulk, and strength is noted. She has an increased tone in the left more than right upper extremity with mild cogwheeling noted.  She has an intermittent mild resting tremor in the left upper extremity.  She has no significant postural or action tremor.  She does have difficulty extending her fingers and reports stiffness and pain in the left hand.  Romberg is not tested due to safety concerns.  On fine motor testing, she has moderate difficulty on the left, slightly better on the right.    Cerebellar testing: No dysmetria or intention tremor. There is no truncal or gait ataxia.  Sensory exam: intact to light touch in the upper and lower extremities.  Gait, station and balance: She stands stands without difficulty, posture is mildly stooped for age.  She has a tendency to flex her left hand and arm while walking and standing.  She walks with slowness no telltale shuffling but significantly decreased arm swing on the left more than  right, some start hesitation noted as well.  Balance is fairly well-preserved.  No walking aid.  No freezing episode.   Assessment and plan:    In summary, Bianca Goodman is a very pleasant 62 year old female with an underlying medical history of chronic neck pain, on chronic narcotic pain medication, hypertension, hypothyroidism, and mildly overweight state, who presents for follow-up consultation of her left-sided predominant parkinsonism disease of approximately 2-1/2 years duration. We started  pramipexole in low-dose in late 2022 and gradually increased it to her current dose of 1 mg 3 times daily, which we increased in September 2023.  She was started on levodopa in January 2024.  She is tolerating 1 pill 3 times daily and is advised at this point to increase it to 1 pill 4 times daily at 4 hourly intervals, starting at 8:30 AM.  She can continue with pramipexole 1 pill 3 times daily.  We talked about the importance of fall prevention, we can monitor for swallowing difficulty but may consider a swallow study in the near future.  She reports that she has been taken off of a water pill.  She is currently not on any antidepressant.  She is advised to sit upright and take smaller bites when eating.  She can consider using a cane for gait safety.  She is advised to continue to hydrate well with water.  She is advised to follow-up routinely to see one of our nurse practitioners in 6 months, sooner if needed.  Her blood pressure was elevated today.  If it continues to stay higher, she is advised to follow-up with PCP, may tie-in with the discontinuation of her diuretic.  I answered all her questions today and she was in agreement.  I spent 30 minutes in total face-to-face time and in reviewing records during pre-charting, more than 50% of which was spent in counseling and coordination of care, reviewing test results, reviewing medications and treatment regimen and/or in discussing or reviewing the diagnosis of PD, the prognosis and treatment options. Pertinent laboratory and imaging test results that were available during this visit with the patient were reviewed by me and considered in my medical decision making (see chart for details).

## 2022-10-11 ENCOUNTER — Encounter: Payer: Medicaid Other | Attending: Registered Nurse | Admitting: Registered Nurse

## 2022-10-11 DIAGNOSIS — M792 Neuralgia and neuritis, unspecified: Secondary | ICD-10-CM | POA: Insufficient documentation

## 2022-10-11 DIAGNOSIS — Z79891 Long term (current) use of opiate analgesic: Secondary | ICD-10-CM | POA: Insufficient documentation

## 2022-10-11 DIAGNOSIS — M5412 Radiculopathy, cervical region: Secondary | ICD-10-CM | POA: Insufficient documentation

## 2022-10-11 DIAGNOSIS — G8929 Other chronic pain: Secondary | ICD-10-CM | POA: Insufficient documentation

## 2022-10-11 DIAGNOSIS — M545 Low back pain, unspecified: Secondary | ICD-10-CM | POA: Insufficient documentation

## 2022-10-11 DIAGNOSIS — M5416 Radiculopathy, lumbar region: Secondary | ICD-10-CM | POA: Insufficient documentation

## 2022-10-11 DIAGNOSIS — Z5181 Encounter for therapeutic drug level monitoring: Secondary | ICD-10-CM | POA: Insufficient documentation

## 2022-10-11 DIAGNOSIS — M542 Cervicalgia: Secondary | ICD-10-CM | POA: Insufficient documentation

## 2022-10-11 DIAGNOSIS — G894 Chronic pain syndrome: Secondary | ICD-10-CM | POA: Insufficient documentation

## 2022-10-12 ENCOUNTER — Telehealth: Payer: Self-pay | Admitting: Family Medicine

## 2022-10-12 ENCOUNTER — Ambulatory Visit: Payer: Medicaid Other | Admitting: Family Medicine

## 2022-10-12 NOTE — Telephone Encounter (Signed)
Pt NS on 5/23 no reason available letter sent

## 2022-10-13 NOTE — Telephone Encounter (Signed)
No show history 09/14/2020 no show 12/01/2020 no show 06/15/2021 no show 09/21/2021 no show 01/25/2022 no show 08/30/2022 same day cancel/transportation 10/12/2022 no show  Previously have sent final warning. Past notations of patient dependent on transportation. Please advise.

## 2022-10-18 ENCOUNTER — Other Ambulatory Visit: Payer: Medicaid Other | Admitting: Licensed Clinical Social Worker

## 2022-10-18 NOTE — Patient Outreach (Signed)
  Medicaid Managed Care   Unsuccessful Attempt Note   10/18/2022 Name: Bianca Goodman MRN: [id number] DOB: 1960/07/22  Referred by: [name], MD Reason for referral : No chief complaint on file.   {MANAGED MEDICAID OUTREACH UNSUCCESSFUL:25040}   Follow Up Plan: {MANAGED MEDICAID FOLLOW UP PLAN:25041}    ***SIGNATURE

## 2022-10-19 NOTE — Patient Instructions (Signed)
[name] ,   The [facility] Managed Care Team is available to provide assistance to you with your healthcare needs at no cost and as a benefit of your [facility] Health plan. I'm sorry I was unable to reach you today for our scheduled appointment. Our care guide will call you to reschedule our telephone appointment. Please call me at the number below. I am available to be of assistance to you regarding your healthcare needs. .   Thank you,   Dickie La, BSW, MSW, LCSW Managed Medicaid LCSW [facility]  [email or web address] Phone: [phone]

## 2022-10-25 ENCOUNTER — Other Ambulatory Visit: Payer: Medicaid Other | Admitting: Licensed Clinical Social Worker

## 2022-10-25 ENCOUNTER — Telehealth: Payer: Self-pay | Admitting: Registered Nurse

## 2022-10-25 MED ORDER — HYDROCODONE-ACETAMINOPHEN 10-325 MG PO TABS: 1.0000 | ORAL_TABLET | Freq: Four times a day (QID) | ORAL | 0 refills | Status: DC | PRN

## 2022-10-25 NOTE — Telephone Encounter (Signed)
PMP was reviewed.  Hydrocodone e-scribed to pharmacy.  Call placed to [name], she states she missed her appointment due to [employer] was called .today,  [phone], they are scheduled to pick [name] up for her scheduled appointment for 11/01/2022.

## 2022-10-25 NOTE — Addendum Note (Signed)
Addended by: [name] on: 10/25/2022 10:34 AM   Modules accepted: Orders

## 2022-10-25 NOTE — Patient Outreach (Incomplete)
Medicaid Managed Care Social Work Note  10/25/2022 Name:  Bianca Goodman MRN:  [id number] DOB:  1960/06/03  Bianca Goodman is an 62 y.o. year old female who is a primary patient of Rudd, Bertram Millard, MD.  The [facility] Managed Care Coordination team was consulted for assistance with:  {MM SW CONSULT REASONS:25043}  Ms. Bloodsaw was given information about Medicaid Managed Care Coordination team services today. Bianca Goodman {MMAMBCONSENTCHOICES:26158}  Engaged with patient  for {MMTELEPHONEFACETOFACE:25045} for{MMINITIALFOLLOWUPCHOICE:25046} in response to referral for case management and/or care coordination services.   Assessments/Interventions:  Review of past medical history, allergies, medications, health status, including review of consultants reports, laboratory and other test data, was performed as part of comprehensive evaluation and provision of chronic care management services.  SDOH: (Social Determinant of Health) assessments and interventions performed: SDOH Interventions    Flowsheet Row Patient Outreach Telephone from 09/20/2022 in [place] POPULATION HEALTH DEPARTMENT Patient Outreach Telephone from 09/15/2022 in [place] POPULATION HEALTH DEPARTMENT  SDOH Interventions    Transportation Interventions -- Intervention Not Indicated  Stress Interventions Offered Bianca Goodman, Provide Counseling Offered [facility]       Advanced Directives Status:  {Advanced Directives Status:25048}  Care Plan                 Allergies  Allergen Reactions   Cortizone-10 [Hydrocortisone] Rash   Tramadol Nausea Only    Medications Reviewed Today     Reviewed by Bertram Savin, RN (Registered Nurse) on 09/27/22 at 1025  Med List Status: <None>   Medication Order Taking? Sig Documenting Provider Last Dose Status Informant  carbidopa-levodopa (SINEMET IR) 25-100 MG tablet [id number]  Take 0.5 tablets by mouth 3 (three) times daily. Ihor Austin, NP  Active    cyanocobalamin (VITAMIN B12) 1000 MCG tablet [id number] No Take 1 tablet (1,000 mcg total) by mouth daily.  Patient not taking: Reported on 09/27/2022   Bianca Mast, MD Not Taking Active            Med Note Bertram Savin   Wed Sep 27, 2022 10:16 AM) Hasn't received from pharmacy yet   HYDROcodone-acetaminophen [facility]) 10-325 MG tablet [id number] Yes Take 1 tablet by mouth every 6 (six) hours as needed. [name], NP Taking Active   hydroxypropyl methylcellulose / hypromellose (ISOPTO TEARS / GONIOVISC) 2.5 % ophthalmic solution [id number] No Place 1 drop into both eyes 3 (three) times daily as needed for dry eyes.  Patient not taking: Reported on 09/27/2022   Bianca Mast, MD Not Taking Active   levothyroxine (SYNTHROID) 88 MCG tablet [id number] Yes Take 1 tablet (88 mcg total) by mouth daily. Bianca Mast, MD Taking Active   losartan (COZAAR) 50 MG tablet [id number] Yes Take 1 tablet (50 mg total) by mouth daily. Bianca Mast, MD Taking Active   Polyvinyl Alcohol-Povidone (ARTIFICIAL TEARS) 5-6 MG/ML SOLN [id number] No Place 1 drop into both eyes 3 (three) times daily as needed.  Patient not taking: Reported on 09/27/2022   Bianca Mast, MD Not Taking Active   pramipexole (MIRAPEX) 1 MG tablet [id number] Yes Take 1 tablet (1 mg total) by mouth 3 (three) times daily. [name], MD Taking Active   pregabalin (LYRICA) 25 MG capsule [id number] Yes Take 1 capsule (25 mg total) by mouth at bedtime. [name], NP Taking Active   verapamil (CALAN) 120 MG tablet [id number] Yes Take 1 tablet (120 mg total) by mouth 2 (two) times daily. Bianca Mast,  MD Taking Active   Vitamin D, Ergocalciferol, (DRISDOL) 1.25 MG (50000 UNIT) CAPS capsule [id number] No Take 1 capsule (50,000 Units total) by mouth every 7 (seven) days.  Patient not taking: Reported on 09/27/2022   Bianca Mast, MD Not Taking Active            Med Note Bianca Goodman Sep 27, 2022 10:15 AM) Hasn't  received from pharmacy yet            Patient Active Problem List   Diagnosis Date Noted   Vitamin D deficiency 09/14/2022   Vitamin B12 deficiency 09/14/2022   Parkinson disease 05/03/2021   Depression, major, single episode, moderate (HCC) 05/03/2021   Atypical squamous cell changes of undetermined significance (ASCUS) on cervical cytology with negative high risk human papilloma virus (HPV) test result 11/09/2020   Essential hypertension 10/28/2020   Hypothyroidism 10/28/2020   History of endometriosis 10/28/2020   Prediabetes 10/28/2020   Radicular pain in left arm 09/14/2020    Conditions to be addressed/monitored per PCP order:  {CCM ASSESSMENT DZ OPTIONS:25047}  There are no care plans that you recently modified to display for this patient.   Follow up:  {FOLLOWUP:24991}  Plan: {MANAGED MEDICAID FOLLOW UP NWGN:56213}  Date/time of next scheduled Social Work care management/care coordination outreach:  ***  ***

## 2022-10-25 NOTE — Patient Instructions (Signed)
Laiyla Behrle ,   The Medicaid Managed Care Team is available to provide assistance to you with your healthcare needs at no cost and as a benefit of your Medicaid Health plan. I'm sorry I was unable to reach you today for our scheduled appointment. Our care guide will call you to reschedule our telephone appointment. Please call me at the number below. I am available to be of assistance to you regarding your healthcare needs. .   Thank you,   Aretta Stetzel, BSW, MSW, LCSW Managed Medicaid LCSW [facility]  [facility] Keirra [email or web address] Phone: [phone]   

## 2022-10-25 NOTE — Telephone Encounter (Signed)
Patient called in and states she will be running out of medication tomorrow 6/6 , patient needs hydrocodone sent to [facility]

## 2022-10-26 ENCOUNTER — Ambulatory Visit: Payer: Medicaid Other | Admitting: Registered Nurse

## 2022-10-26 ENCOUNTER — Other Ambulatory Visit: Payer: Medicaid Other

## 2022-10-26 NOTE — Patient Instructions (Signed)
Visit Information  Bianca Goodman was given information about Medicaid Managed Care team care coordination services as a part of their [facility] Medicaid benefit. Bianca Goodman verbally consented to engagement with the [facility] Managed Care team.   If you are experiencing a medical emergency, please call 911 or report to your local emergency department or urgent care.   If you have a non-emergency medical problem during routine business hours, please contact your provider's office and ask to speak with a nurse.   For questions related to your [facility] health plan, please call: [phone] or go here:[email or web address]  If you would like to schedule transportation through your [facility] plan, please call the following number at least 2 days in advance of your appointment: [phone].   You can also use the MTM portal or MTM mobile app to manage your rides. Reimbursement for transportation is available through [facility]! For the portal, please go to [email or web address]  Call the [facility] Crisis Line at [phone], at any time, 24 hours a day, 7 days a week. If you are in danger or need immediate medical attention call 911.  If you would like help to quit smoking, call 1-800-QUIT-NOW (346-329-7077) OR Espaol: 1-855-Djelo-Ya (4-132-440-1027) o para ms informacin haga clic aqu or Text READY to 253-664 to register via text  Bianca Goodman - following are the goals we discussed in your visit today:   Goals Addressed   None      Social Worker will follow up in 30 days.   Bianca Goodman, Bianca Goodman, MHA [facility] Health  Managed Medicaid Social Worker [phone]   Following is a copy of your plan of care:  There are no care plans that you recently modified to display for this patient.

## 2022-10-26 NOTE — Patient Outreach (Signed)
Medicaid Managed Care Social Work Note  10/26/2022 Name:  Bianca Goodman MRN:  [id number] DOB:  [birth year]  Bianca Goodman is an 62 y.o. year old female who is a primary patient of Rudd, Bertram Millard, MD.  The Medicaid Managed Care Coordination team was consulted for assistance with:  Community Resources   Bianca Goodman was given information about Medicaid Managed Care Coordination team services today. [name] Patient agreed to services and verbal consent obtained.  Engaged with patient  for by telephone forfollow up visit in response to referral for case management and/or care coordination services.   Assessments/Interventions:  Review of past medical history, allergies, medications, health status, including review of consultants reports, laboratory and other test data, was performed as part of comprehensive evaluation and provision of chronic care management services.  SDOH: (Social Determinant of Health) assessments and interventions performed: SDOH Interventions    Flowsheet Row Patient Outreach Telephone from 09/20/2022 in [place] POPULATION HEALTH DEPARTMENT Patient Outreach Telephone from 09/15/2022 in  POPULATION HEALTH DEPARTMENT  SDOH Interventions    Transportation Interventions -- Intervention Not Indicated  Stress Interventions Offered [name] Resources, Provide Counseling Offered Community Wellness Resources     BSW completed a telephone outreach with patient, she stated she did not sleep good because she kept waking up to go to the restroom, patient stated she will be calling her PCP today. Patient states she did not receive the resources BSW sent to her, BSW will resend resources. No additional resources are needed at this time.   Advanced Directives Status:  Not addressed in this encounter.  Care Plan                 Allergies  Allergen Reactions   Cortizone-10 [Hydrocortisone] Rash   Tramadol Nausea Only    Medications Reviewed Today     Reviewed by  Bertram Savin, RN (Registered Nurse) on 09/27/22 at 1025  Med List Status: <None>   Medication Order Taking? Sig Documenting Provider Last Dose Status Informant  carbidopa-levodopa (SINEMET IR) 25-100 MG tablet [id number]  Take 0.5 tablets by mouth 3 (three) times daily. Ihor Austin, NP  Active   cyanocobalamin (VITAMIN B12) 1000 MCG tablet [id number] No Take 1 tablet (1,000 mcg total) by mouth daily.  Patient not taking: Reported on 09/27/2022   [name], MD Not Taking Active            Med Note Bertram Savin   Wed Sep 27, 2022 10:16 AM) Hasn't received from pharmacy yet   HYDROcodone-acetaminophen [facility]) 10-325 MG tablet [id number] Yes Take 1 tablet by mouth every 6 (six) hours as needed. [name], NP Taking Active   hydroxypropyl methylcellulose / hypromellose (ISOPTO TEARS / GONIOVISC) 2.5 % ophthalmic solution [id number] No Place 1 drop into both eyes 3 (three) times daily as needed for dry eyes.  Patient not taking: Reported on 09/27/2022   [name], MD Not Taking Active   levothyroxine (SYNTHROID) 88 MCG tablet [id number] Yes Take 1 tablet (88 mcg total) by mouth daily. [name], MD Taking Active   losartan (COZAAR) 50 MG tablet [id number] Yes Take 1 tablet (50 mg total) by mouth daily. [name], MD Taking Active   Polyvinyl Alcohol-Povidone (ARTIFICIAL TEARS) 5-6 MG/ML SOLN [id number] No Place 1 drop into both eyes 3 (three) times daily as needed.  Patient not taking: Reported on 09/27/2022   [name], MD Not Taking Active   pramipexole (MIRAPEX) 1 MG tablet  [id number] Yes Take 1 tablet (1 mg total) by mouth 3 (three) times daily. [name], MD Taking Active   pregabalin (LYRICA) 25 MG capsule [id number] Yes Take 1 capsule (25 mg total) by mouth at bedtime. [name], NP Taking Active   verapamil (CALAN) 120 MG tablet [id number] Yes Take 1 tablet (120 mg total) by mouth 2 (two) times daily. [name], MD Taking Active    Vitamin D, Ergocalciferol, (DRISDOL) 1.25 MG (50000 UNIT) CAPS capsule [id number] No Take 1 capsule (50,000 Units total) by mouth every 7 (seven) days.  Patient not taking: Reported on 09/27/2022   [name], MD Not Taking Active            Med Note Elizabeth Palau Sep 27, 2022 10:15 AM) Hasn't received from pharmacy yet            Patient Active Problem List   Diagnosis Date Noted   Vitamin D deficiency 09/14/2022   Vitamin B12 deficiency 09/14/2022   Parkinson disease 05/03/2021   Depression, major, single episode, moderate (HCC) 05/03/2021   Atypical squamous cell changes of undetermined significance (ASCUS) on cervical cytology with negative high risk human papilloma virus (HPV) test result 11/09/2020   Essential hypertension 10/28/2020   Hypothyroidism 10/28/2020   History of endometriosis 10/28/2020   Prediabetes 10/28/2020   Radicular pain in left arm 09/14/2020    Conditions to be addressed/monitored per PCP order:   Community resources  There are no care plans that you recently modified to display for this patient.   Follow up:  Patient agrees to Care Plan and Follow-up.  Plan: The Managed Medicaid care management team will reach out to the patient again over the next 30 days.  Date/time of next scheduled Social Work care management/care coordination outreach:  11/27/22  Gus Puma, Kenard Gower, [facility] Health  Managed [facility] Social Worker [phone]

## 2022-10-27 NOTE — Telephone Encounter (Signed)
I have unblocked to allow scheduling.

## 2022-10-27 NOTE — Telephone Encounter (Signed)
Pt Called to make an appt but is blocked due to so many no shows. She was told and feels it was not her fault due to her transportation did not pick her up each time.

## 2022-11-01 ENCOUNTER — Encounter: Payer: Self-pay | Admitting: Registered Nurse

## 2022-11-01 ENCOUNTER — Encounter: Payer: Medicaid Other | Attending: Registered Nurse | Admitting: Registered Nurse

## 2022-11-01 VITALS — BP 129/85 | HR 80 | Ht 62.0 in | Wt 171.0 lb

## 2022-11-01 DIAGNOSIS — Z87891 Personal history of nicotine dependence: Secondary | ICD-10-CM | POA: Insufficient documentation

## 2022-11-01 DIAGNOSIS — G894 Chronic pain syndrome: Secondary | ICD-10-CM | POA: Diagnosis present

## 2022-11-01 DIAGNOSIS — M792 Neuralgia and neuritis, unspecified: Secondary | ICD-10-CM | POA: Insufficient documentation

## 2022-11-01 DIAGNOSIS — Z5181 Encounter for therapeutic drug level monitoring: Secondary | ICD-10-CM | POA: Diagnosis present

## 2022-11-01 DIAGNOSIS — M5412 Radiculopathy, cervical region: Secondary | ICD-10-CM | POA: Insufficient documentation

## 2022-11-01 DIAGNOSIS — G8929 Other chronic pain: Secondary | ICD-10-CM | POA: Insufficient documentation

## 2022-11-01 DIAGNOSIS — Z79891 Long term (current) use of opiate analgesic: Secondary | ICD-10-CM | POA: Diagnosis present

## 2022-11-01 DIAGNOSIS — M5416 Radiculopathy, lumbar region: Secondary | ICD-10-CM | POA: Diagnosis not present

## 2022-11-01 DIAGNOSIS — M546 Pain in thoracic spine: Secondary | ICD-10-CM | POA: Insufficient documentation

## 2022-11-01 DIAGNOSIS — M542 Cervicalgia: Secondary | ICD-10-CM | POA: Diagnosis present

## 2022-11-01 DIAGNOSIS — M545 Low back pain, unspecified: Secondary | ICD-10-CM | POA: Insufficient documentation

## 2022-11-01 DIAGNOSIS — M25512 Pain in left shoulder: Secondary | ICD-10-CM | POA: Diagnosis present

## 2022-11-01 NOTE — Progress Notes (Signed)
Subjective:    Patient ID: Bianca Goodman, female    DOB: 1961/01/14, 62 y.o.   MRN: [id number]  HPI: Bianca Goodman is a 62 y.o. female who returns for follow up appointment for chronic pain and medication refill. She states her pain is located in her neck radiating into her left shoulder and left arm with tingling. She also reports mid- lower back pain radiating into her left lower extremity.She  rates her pain 8. Her current exercise regime is walking and performing stretching exercises.  Bianca Goodman Morphine equivalent is 40.00 MME.   Last UDS was Performed on 09/06/2022, it was consistent.     Pain Inventory Average Pain 7 Pain Right Now 8 My pain is constant, sharp, and burning  In the last 24 hours, has pain interfered with the following? General activity 7 Relation with others 7 Enjoyment of life 8 What TIME of day is your pain at its worst? morning  and daytime Sleep (in general) Poor  Pain is worse with: walking, bending, and some activites Pain improves with: rest, heat/ice, and medication Relief from Meds: 4  Family History  Problem Relation Age of Onset   Cancer Mother    Heart disease Father    Heart disease Brother    Heart attack Brother        stent placement   Social History   Socioeconomic History   Marital status: Widowed    Spouse name: Not on file   Number of children: 1   Years of education: Not on file   Highest education level: Not on file  Occupational History   Not on file  Tobacco Use   Smoking status: Former    Types: Cigarettes    Quit date: 09/20/2019    Years since quitting: 3.1   Smokeless tobacco: Never  Vaping Use   Vaping Use: Never used  Substance and Sexual Activity   Alcohol use: Not Currently   Drug use: Never   Sexual activity: Yes  Other Topics Concern   Not on file  Social History Narrative   Caffeine one cup daily, coffee with milk.   Lives with son.  Widow.  Education HS.     Social Determinants of Health   Financial  Resource Strain: Medium Risk (09/25/2022)   Overall Financial Resource Strain (CARDIA)    Difficulty of Paying Living Expenses: Somewhat hard  Food Insecurity: No Food Insecurity (09/25/2022)   Hunger Vital Sign    Worried About Running Out of Food in the Last Year: Never true    Ran Out of Food in the Last Year: Never true  Transportation Needs: No Transportation Needs (09/15/2022)   PRAPARE - Administrator, Civil Service (Medical): No    Lack of Transportation (Non-Medical): No  Physical Activity: Not on file  Stress: Stress Concern Present (09/20/2022)   [employer] of Occupational Health - Occupational Stress Questionnaire    Feeling of Stress : To some extent  Social Connections: Not on file   Past Surgical History:  Procedure Laterality Date   APPENDECTOMY     1997   CERVICAL SPINE SURGERY     C4-5 spinal fusion   RIGHT OOPHORECTOMY     scalenectomy     1985   Past Surgical History:  Procedure Laterality Date   APPENDECTOMY     1997   CERVICAL SPINE SURGERY     C4-5 spinal fusion   RIGHT OOPHORECTOMY     scalenectomy  1985   Past Medical History:  Diagnosis Date   Endometriosis    Hypertension    Migraines    Parkinson's disease    Thyroid disease    There were no vitals taken for this visit.  Opioid Risk Score:   Fall Risk Score:  `1  Depression screen [facility] 2/9     09/15/2022   11:13 AM 09/06/2022   10:18 AM 08/02/2022    9:59 AM 06/15/2022    9:23 AM 05/05/2022    9:53 AM 04/05/2022    9:45 AM 03/02/2022   10:13 AM  Depression screen PHQ 2/9  Decreased Interest 1 1 1 1  0 1 1  Down, Depressed, Hopeless 1 1  1  0 1 1  PHQ - 2 Score 2 2 1 2  0 2 2  Altered sleeping 1  1      Tired, decreased energy 3        Change in appetite 0        Feeling bad or failure about yourself  0        Trouble concentrating 0        Moving slowly or fidgety/restless 0        Suicidal thoughts 0        PHQ-9 Score 6  2      Difficult doing work/chores  Somewhat difficult           Review of Systems  Musculoskeletal:  Positive for neck pain.       LT arm shoulder pain  All other systems reviewed and are negative.      Objective:   Physical Exam Vitals and nursing note reviewed.  Constitutional:      Appearance: Normal appearance.  Neck:     Comments: Cervical Paraspinal Tenderness: C-5-C-6 Cardiovascular:     Rate and Rhythm: Normal rate and regular rhythm.     Pulses: Normal pulses.     Heart sounds: Normal heart sounds.  Pulmonary:     Effort: Pulmonary effort is normal.     Breath sounds: Normal breath sounds.  Musculoskeletal:     Cervical back: Normal range of motion and neck supple.     Comments: Normal Muscle Bulk and Muscle Testing Reveals:  Upper Extremities: Right: Full ROM and Muscle Strength 4/5 Left Upper Extremity: Decreased ROM 5/5 Degrees and Muscle Strength 4/5 Lumbar Paraspinal Tenderness: L-2-L-3 Lower Extremities: Full ROM and Muscle Strength 5/5 Bilateral Lower Extremities Flexion Produces Pain into her Bilateral Groin  Arises from Table slowly Narrow Based Gait     Skin:    General: Skin is warm and dry.  Neurological:     Mental Status: She is alert and oriented to person, place, and time.  Psychiatric:        Mood and Affect: Mood normal.        Behavior: Behavior normal.         Assessment & Plan:  1.Cervicalgia/ Cervical Radiculitis:Continue HEP as Tolerated. Continue Gabapentin 100 mg BID .Continue to monitor. 11/01/2022 Left Arm Radicular Pain: Continue Gabapentin . Continue to Monitor.11/01/2022 2. Chronic Pain Syndrome: Continue Hydrocodone 10 mg/325 one tablet 4  times a day as needed for pain #120. We will continue the opioid monitoring program, this consists of regular clinic visits, examinations, urine drug screen, pill counts as well as use of [place] Controlled Substance Reporting system. A 12 month History has been reviewed on the [place] Controlled Substance  Reporting System Today.   Continue current medication  regimen. Continue to monitor. 11/01/2022 3. Left Hand Pain: Ortho Following. Continue to monitor. 11/01/2022 4. Chronic Thoracic Pain: Continue current medication regimen. Continue to monitor. 11/01/2022 5. Bilateral Greater Trochanter Bursitis:  Continue alternate Ice and Heat Therapy . Continue to monitor. 11/01/2022 6. Left Hand Tremor: Neurology Following. Continue to monitor. 11/01/2022 7 Fall at Home: No Falls this month. Educated on Falls Prevention: She verbalizes understanding. 11/01/2022    F/U in 1 month

## 2022-11-03 ENCOUNTER — Telehealth: Payer: Self-pay | Admitting: Family Medicine

## 2022-11-03 ENCOUNTER — Encounter: Payer: Self-pay | Admitting: Family Medicine

## 2022-11-03 ENCOUNTER — Ambulatory Visit: Payer: Medicaid Other | Admitting: Family Medicine

## 2022-11-03 NOTE — Telephone Encounter (Signed)
6.14.24 pt called today at 12:55 stated that her ride did not come kept telling her it was on the way. pt want to reschedule   Please advise how to proceed   No show history 09/14/2020 no show 12/01/2020 no show 06/15/2021 no show 09/21/2021 no show 01/25/2022 no show 08/30/2022 same day cancel/transportation 10/12/2022 no show

## 2022-11-03 NOTE — Telephone Encounter (Signed)
Mailing final warning letter 

## 2022-11-06 ENCOUNTER — Telehealth: Payer: Self-pay | Admitting: Registered Nurse

## 2022-11-06 NOTE — Telephone Encounter (Signed)
Jacalyn Lefevre, NP with Pain Mgmt called to update PCP office that pt insurance changed transportation company. [name] has called transportation on pts behalf and they said they were waiting for someone to "pick up her number". Unsure what that meant but working with transportation to make sure pt does not miss further PCP visits. She sent msg to Dr. Veto Kemps as well.

## 2022-11-06 NOTE — Telephone Encounter (Signed)
Call placed to [name] Transportation Service: [employer]  This provider tried to speak with a Merchandiser, retail, had to leave a voicemail.  I called the [employer], [name] has a scheduled appointment with Dr Veto Kemps on 11/13/2022. I also explained the last company was very late picking her up and now, she is unable to miss any appointments with Dr Veto Kemps or she will be discharged. They verbalize understanding. The above was also discussed with [name] she verbalizes understanding.  Awaiting supervisor call regarding the above.

## 2022-11-07 ENCOUNTER — Telehealth: Payer: Self-pay

## 2022-11-07 NOTE — Telephone Encounter (Signed)
..   Medicaid Managed Care   Unsuccessful Outreach Note  11/07/2022 Name: Bianca Goodman MRN: [id number] DOB: March 14, 1961  Referred by: [name], MD Reason for referral : Appointment (I called the patient today to get her missed phone appointments rescheduled with the MM LCSW. I left a message on her voicemail.)   A second unsuccessful telephone outreach was attempted today. The patient was referred to the case management team for assistance with care management and care coordination.   Follow Up Plan: A HIPAA compliant phone message was left for the patient providing contact information and requesting a return call.  The care management team will reach out to the patient again over the next 7 days.     Weston Settle Care Guide  [facility] Managed  Care Guide [facility]  [phone]

## 2022-11-08 ENCOUNTER — Telehealth: Payer: Self-pay

## 2022-11-08 NOTE — Telephone Encounter (Signed)
..  Patient declines further follow up and engagement by the Managed Medicaid Team. Appropriate care team members and provider have been notified via electronic communication. The Managed Medicaid Team is available to follow up with the patient after provider conversation with the patient regarding recommendation for engagement and subsequent re-referral to the Managed Medicaid Team.     Violett Hobbs Care Guide  Medicaid Managed  Care Guide [place]  [phone]  

## 2022-11-13 ENCOUNTER — Ambulatory Visit (INDEPENDENT_AMBULATORY_CARE_PROVIDER_SITE_OTHER): Payer: Medicaid Other | Admitting: Family Medicine

## 2022-11-13 VITALS — BP 112/70 | HR 84 | Temp 98.1°F | Ht 62.0 in | Wt 164.0 lb

## 2022-11-13 DIAGNOSIS — Z1231 Encounter for screening mammogram for malignant neoplasm of breast: Secondary | ICD-10-CM

## 2022-11-13 DIAGNOSIS — Z1211 Encounter for screening for malignant neoplasm of colon: Secondary | ICD-10-CM

## 2022-11-13 DIAGNOSIS — E039 Hypothyroidism, unspecified: Secondary | ICD-10-CM | POA: Diagnosis not present

## 2022-11-13 DIAGNOSIS — E538 Deficiency of other specified B group vitamins: Secondary | ICD-10-CM

## 2022-11-13 DIAGNOSIS — G20A2 Parkinson's disease without dyskinesia, with fluctuations: Secondary | ICD-10-CM

## 2022-11-13 DIAGNOSIS — J4520 Mild intermittent asthma, uncomplicated: Secondary | ICD-10-CM

## 2022-11-13 DIAGNOSIS — I10 Essential (primary) hypertension: Secondary | ICD-10-CM | POA: Diagnosis not present

## 2022-11-13 DIAGNOSIS — E559 Vitamin D deficiency, unspecified: Secondary | ICD-10-CM

## 2022-11-13 MED ORDER — VITAMIN B-12 1000 MCG PO TABS
1000.0000 ug | ORAL_TABLET | Freq: Every day | ORAL | 3 refills | Status: DC
Start: 2022-11-13 — End: 2023-06-27

## 2022-11-13 MED ORDER — VITAMIN D (ERGOCALCIFEROL) 1.25 MG (50000 UNIT) PO CAPS: 50000.0000 [IU] | ORAL_CAPSULE | ORAL | 1 refills | Status: DC

## 2022-11-13 MED ORDER — ALBUTEROL SULFATE HFA 108 (90 BASE) MCG/ACT IN AERS
2.0000 | INHALATION_SPRAY | Freq: Four times a day (QID) | RESPIRATORY_TRACT | 2 refills | Status: DC | PRN
Start: 2022-11-13 — End: 2023-10-10

## 2022-11-13 NOTE — Assessment & Plan Note (Signed)
Blood pressure is in good control. Continue losartan 50 mg daily and verapamil 120 mg bid.

## 2022-11-13 NOTE — Progress Notes (Signed)
[facility] [address] [place] [address] Dept: [phone] Dept Fax: [phone]  Chronic Care Office Visit  Subjective:    Patient ID: Bianca Goodman, female    DOB: 06/11/1960, 62 y.o..   MRN: [id number]  Chief Complaint  Patient presents with   Medical Management of Chronic Issues    F/u.  Concerned about chest pressure and balance issues.     History of Present Illness:  Patient is in today for reassessment of chronic medical issues.  Bianca Goodman has a history of Parkinson's disease. She is managed by Dr. Frances Furbish (neurology). She is currently managed on pramipexole 1 mg TID and Sinemet 25-100 mg QID. The addition of the Sinemet has helped to reduce her tremor. She still notes episodes of bradykinesia and postural instability. She has been unable to work due to this and her left radicular issues. She has applied for disability with social security and is workign through this process.   Bianca Goodman continues to deal with depressive symptoms related to fears about her Parkinson's disease. She previously stopped taking her fluoxetine, as she felt it was not helping.    Bianca Goodman continues to work with [facility] Bianca Goodman) for her chronic pain management. She is managed on pregabalin and Norco.    Bianca Goodman has a history of hypertension. She is managed on losartan 50 mg daily and verapamil 120 mg bid. She had been on hydrochlorothiazide, but we stopped this at her last visit to try and improve frequent urination.   Bianca Goodman has a history of hypothyroidism. She is managed on levothyroxine 88 mcg daily.   Past Medical History: Patient Active Problem List   Diagnosis Date Noted   Asthma, mild intermittent 11/13/2022   Vitamin D deficiency 09/14/2022   Vitamin B12 deficiency 09/14/2022   Parkinson disease 05/03/2021   Depression, major, single episode, moderate (HCC) 05/03/2021   Atypical  squamous cell changes of undetermined significance (ASCUS) on cervical cytology with negative high risk human papilloma virus (HPV) test result 11/09/2020   Essential hypertension 10/28/2020   Hypothyroidism 10/28/2020   History of endometriosis 10/28/2020   Prediabetes 10/28/2020   Radicular pain in left arm 09/14/2020   Past Surgical History:  Procedure Laterality Date   APPENDECTOMY     1997   CERVICAL SPINE SURGERY     C4-5 spinal fusion   RIGHT OOPHORECTOMY     scalenectomy     1985   Family History  Problem Relation Age of Onset   Cancer Mother    Heart disease Father    Heart disease Brother    Heart attack Brother        stent placement   Outpatient Medications Prior to Visit  Medication Sig Dispense Refill   carbidopa-levodopa (SINEMET IR) 25-100 MG tablet Take 1 tablet by mouth 4 (four) times daily. 8:30 AM, 12:30 PM, 4:30 PM, and 8:30 PM daily 360 tablet 3   HYDROcodone-acetaminophen (NORCO) 10-325 MG tablet Take 1 tablet by mouth every 6 (six) hours as needed. 120 tablet 0   levothyroxine (SYNTHROID) 88 MCG tablet Take 1 tablet (88 mcg total) by mouth daily. 90 tablet 3   losartan (COZAAR) 50 MG tablet Take 1 tablet (50 mg total) by mouth daily. 90 tablet 3   Polyvinyl Alcohol-Povidone (ARTIFICIAL TEARS) 5-6 MG/ML SOLN Place 1 drop into both eyes 3 (three) times daily as needed. 30 mL 5   pramipexole (MIRAPEX) 1 MG tablet Take  1 tablet (1 mg total) by mouth 3 (three) times daily. 270 tablet 3   pregabalin (LYRICA) 25 MG capsule Take 1 capsule (25 mg total) by mouth at bedtime. 30 capsule 0   verapamil (CALAN) 120 MG tablet Take 1 tablet (120 mg total) by mouth 2 (two) times daily. 180 tablet 3   albuterol (VENTOLIN HFA) 108 (90 Base) MCG/ACT inhaler Inhale 2 puffs into the lungs every 6 (six) hours as needed for wheezing or shortness of breath.     cyanocobalamin (VITAMIN B12) 1000 MCG tablet Take 1 tablet (1,000 mcg total) by mouth daily. 90 tablet 3   hydroxypropyl  methylcellulose / hypromellose (ISOPTO TEARS / GONIOVISC) 2.5 % ophthalmic solution Place 1 drop into both eyes 3 (three) times daily as needed for dry eyes. (Patient not taking: Reported on 11/13/2022) 15 mL 12   Vitamin D, Ergocalciferol, (DRISDOL) 1.25 MG (50000 UNIT) CAPS capsule Take 1 capsule (50,000 Units total) by mouth every 7 (seven) days. (Patient not taking: Reported on 11/13/2022) 12 capsule 1   No facility-administered medications prior to visit.   Allergies  Allergen Reactions   Cortizone-10 [Hydrocortisone] Rash   Tramadol Nausea Only   Objective:   Today's Vitals   11/13/22 1023  BP: 112/70  Pulse: 84  Temp: 98.1 F (36.7 C)  TempSrc: Temporal  SpO2: 99%  Weight: 164 lb (74.4 kg)  Height: 5\' 2"  (1.575 m)   Body mass index is 30 kg/m.   General: Well developed, well nourished. No acute distress. Neuro: No resting tremor today. Some cogwheel rigidity. Tremor does come out with progressive   flexion/extension of the forearm. Some postural instability noted. Psych: Alert and oriented. Normal mood and affect.  Health Maintenance Due  Topic Date Due   Colonoscopy  Never done   MAMMOGRAM  Never done   Lab Results    Latest Ref Rng & Units 09/13/2022   11:12 AM 02/24/2022   10:07 AM 12/14/2020   10:42 AM  CMP  Glucose 70 - 99 mg/dL 91  952  841   BUN 6 - 23 mg/dL 12  13    Creatinine 3.24 - 1.20 mg/dL 4.01  0.27    Sodium 253 - 145 mEq/L 140  139    Potassium 3.5 - 5.1 mEq/L 4.5  4.0    Chloride 96 - 112 mEq/L 102  104    CO2 19 - 32 mEq/L 27  27    Calcium 8.4 - 10.5 mg/dL 9.3  9.2     Last hemoglobin A1c Lab Results  Component Value Date   HGBA1C 6.5 09/13/2022   Last thyroid functions Lab Results  Component Value Date   TSH 0.98 09/13/2022   Last vitamin D Lab Results  Component Value Date   VD25OH 13.40 (L) 09/13/2022   Last vitamin B12 and Folate Lab Results  Component Value Date   VITAMINB12 167 (L) 09/13/2022   Assessment & Plan:    Problem List Items Addressed This Visit       Cardiovascular and Mediastinum   Essential hypertension    Blood pressure is in good control. Continue losartan 50 mg daily and verapamil 120 mg bid.        Respiratory   Asthma, mild intermittent - Primary    Stable. I will renew her albuterol inhaler.      Relevant Medications   albuterol (VENTOLIN HFA) 108 (90 Base) MCG/ACT inhaler     Endocrine   Hypothyroidism    TSH at  goal. Continue levothyroxine 88 mcg daily.        Nervous and Auditory   Parkinson disease    Tremor improved on pramipexole 1 mg TID and Sinemet 25-100 mg QID. We discussed that her "dizziness" is postural instability related to her Parkinson's. I support her seeking disability, as her PD and chronic pain would preclude her from being able to work.        Other   Vitamin D deficiency    I will renew her Vitamin D.      Relevant Medications   Vitamin D, Ergocalciferol, (DRISDOL) 1.25 MG (50000 UNIT) CAPS capsule   Vitamin B12 deficiency    Her last B12 level was low. ms. Josephson states the pharmacy has not been giving her her B12 supplement. I will reorder this.      Relevant Medications   cyanocobalamin (VITAMIN B12) 1000 MCG tablet   Other Visit Diagnoses     Screening for colon cancer       Relevant Orders   Cologuard   Encounter for screening mammogram for malignant neoplasm of breast       Relevant Orders   MM DIGITAL SCREENING BILATERAL       Return in about 3 months (around 02/13/2023) for Reassessment.   [name], MD

## 2022-11-13 NOTE — Assessment & Plan Note (Signed)
Tremor improved on pramipexole 1 mg TID and Sinemet 25-100 mg QID. We discussed that her "dizziness" is postural instability related to her Parkinson's. I support her seeking disability, as her PD and chronic pain would preclude her from being able to work.

## 2022-11-13 NOTE — Assessment & Plan Note (Signed)
Her last B12 level was low. Bianca Goodman states the pharmacy has not been giving her her B12 supplement. I will reorder this.

## 2022-11-13 NOTE — Assessment & Plan Note (Signed)
I will renew her Vitamin D.

## 2022-11-13 NOTE — Assessment & Plan Note (Addendum)
TSH at goal. Continue levothyroxine 88 mcg daily. 

## 2022-11-13 NOTE — Assessment & Plan Note (Signed)
Stable. I will renew her albuterol inhaler. 

## 2022-11-22 ENCOUNTER — Encounter: Payer: Medicaid Other | Attending: Registered Nurse | Admitting: Registered Nurse

## 2022-11-22 VITALS — BP 128/84 | HR 92 | Ht 62.0 in | Wt 163.0 lb

## 2022-11-22 DIAGNOSIS — M545 Low back pain, unspecified: Secondary | ICD-10-CM | POA: Diagnosis present

## 2022-11-22 DIAGNOSIS — Z79891 Long term (current) use of opiate analgesic: Secondary | ICD-10-CM | POA: Diagnosis present

## 2022-11-22 DIAGNOSIS — Z5181 Encounter for therapeutic drug level monitoring: Secondary | ICD-10-CM | POA: Insufficient documentation

## 2022-11-22 DIAGNOSIS — M25522 Pain in left elbow: Secondary | ICD-10-CM | POA: Insufficient documentation

## 2022-11-22 DIAGNOSIS — M542 Cervicalgia: Secondary | ICD-10-CM | POA: Insufficient documentation

## 2022-11-22 DIAGNOSIS — M792 Neuralgia and neuritis, unspecified: Secondary | ICD-10-CM | POA: Insufficient documentation

## 2022-11-22 DIAGNOSIS — G8929 Other chronic pain: Secondary | ICD-10-CM | POA: Insufficient documentation

## 2022-11-22 DIAGNOSIS — M5416 Radiculopathy, lumbar region: Secondary | ICD-10-CM | POA: Insufficient documentation

## 2022-11-22 DIAGNOSIS — M5412 Radiculopathy, cervical region: Secondary | ICD-10-CM | POA: Insufficient documentation

## 2022-11-22 DIAGNOSIS — R208 Other disturbances of skin sensation: Secondary | ICD-10-CM | POA: Insufficient documentation

## 2022-11-22 DIAGNOSIS — G894 Chronic pain syndrome: Secondary | ICD-10-CM | POA: Diagnosis present

## 2022-11-22 DIAGNOSIS — M25512 Pain in left shoulder: Secondary | ICD-10-CM | POA: Insufficient documentation

## 2022-11-22 DIAGNOSIS — M79602 Pain in left arm: Secondary | ICD-10-CM | POA: Insufficient documentation

## 2022-11-22 MED ORDER — HYDROCODONE-ACETAMINOPHEN 10-325 MG PO TABS: 1.0000 | ORAL_TABLET | Freq: Four times a day (QID) | ORAL | 0 refills | Status: DC | PRN

## 2022-11-22 MED ORDER — PREGABALIN 25 MG PO CAPS: 25.0000 mg | ORAL_CAPSULE | Freq: Every day | ORAL | 3 refills | Status: DC

## 2022-11-22 NOTE — Progress Notes (Signed)
Subjective:    Patient ID: Bianca Goodman, female    DOB: 02/21/1961, 62 y.o.   MRN: [id number]  HPI: Bianca Goodman is a 62 y.o. female who returns for follow up appointment for chronic pain and medication refill. She states her pain is located in her neck radiating into her left shoulder, left armm to her elbow with tingling and burning with pinky and ring finger. Also reports lower back pain radiating into her left lower extremity. She rates her pain 7. Her current exercise regime is walking.   [name] Morphine equivalent is 40.00 MME.  Last UDS was Performed on 09/06/2022, it was consistent.      Pain Inventory Average Pain 7 Pain Right Now 7 My pain is sharp, burning, tingling, and aching  In the last 24 hours, has pain interfered with the following? General activity 7 Relation with others 7 Enjoyment of life 7 What TIME of day is your pain at its worst? morning  and daytime Sleep (in general) Poor  Pain is worse with: walking and sitting Pain improves with: rest, heat/ice, and medication Relief from Meds: 4  Family History  Problem Relation Age of Onset   Cancer Mother    Heart disease Father    Heart disease Brother    Heart attack Brother        stent placement   Social History   Socioeconomic History   Marital status: Widowed    Spouse name: Not on file   Number of children: 1   Years of education: Not on file   Highest education level: Not on file  Occupational History   Not on file  Tobacco Use   Smoking status: Former    Types: Cigarettes    Quit date: 09/20/2019    Years since quitting: 3.1   Smokeless tobacco: Never  Vaping Use   Vaping Use: Never used  Substance and Sexual Activity   Alcohol use: Not Currently   Drug use: Never   Sexual activity: Yes  Other Topics Concern   Not on file  Social History Narrative   Caffeine one cup daily, coffee with milk.   Lives with son.  Widow.  Education HS.     Social Determinants of Health   Financial  Resource Strain: Medium Risk (09/25/2022)   Overall Financial Resource Strain (CARDIA)    Difficulty of Paying Living Expenses: Somewhat hard  Food Insecurity: No Food Insecurity (09/25/2022)   Hunger Vital Sign    Worried About Running Out of Food in the Last Year: Never true    Ran Out of Food in the Last Year: Never true  Transportation Needs: No Transportation Needs (09/15/2022)   PRAPARE - Administrator, Civil Service (Medical): No    Lack of Transportation (Non-Medical): No  Physical Activity: Not on file  Stress: Stress Concern Present (09/20/2022)   [employer] of Occupational Health - Occupational Stress Questionnaire    Feeling of Stress : To some extent  Social Connections: Not on file   Past Surgical History:  Procedure Laterality Date   APPENDECTOMY     1997   CERVICAL SPINE SURGERY     C4-5 spinal fusion   RIGHT OOPHORECTOMY     scalenectomy     1985   Past Surgical History:  Procedure Laterality Date   APPENDECTOMY     1997   CERVICAL SPINE SURGERY     C4-5 spinal fusion   RIGHT OOPHORECTOMY     scalenectomy  1985   Past Medical History:  Diagnosis Date   Endometriosis    Hypertension    Migraines    Parkinson's disease    Thyroid disease    BP 128/84   Pulse 92   Ht 5\' 2"  (1.575 m)   Wt 163 lb (73.9 kg)   SpO2 99%   BMI 29.81 kg/m   Opioid Risk Score:   Fall Risk Score:  `1  Depression screen [facility] 2/9     11/01/2022   10:03 AM 09/15/2022   11:13 AM 09/06/2022   10:18 AM 08/02/2022    9:59 AM 06/15/2022    9:23 AM 05/05/2022    9:53 AM 04/05/2022    9:45 AM  Depression screen PHQ 2/9  Decreased Interest 3 1 1 1 1  0 1  Down, Depressed, Hopeless 2 1 1  1  0 1  PHQ - 2 Score 5 2 2 1 2  0 2  Altered sleeping 3 1  1      Tired, decreased energy 3 3       Change in appetite 1 0       Feeling bad or failure about yourself  2 0       Trouble concentrating 0 0       Moving slowly or fidgety/restless 2 0       Suicidal thoughts  0        PHQ-9 Score 16 6  2      Difficult doing work/chores  Somewhat difficult          Review of Systems  Constitutional: Negative.   HENT: Negative.    Eyes: Negative.   Respiratory: Negative.    Cardiovascular: Negative.   Gastrointestinal: Negative.   Endocrine: Negative.   Genitourinary: Negative.   Musculoskeletal:  Positive for arthralgias and back pain.       Left shoulder pain  Skin: Negative.   Allergic/Immunologic: Negative.   Neurological: Negative.   Hematological: Negative.   Psychiatric/Behavioral:  Positive for dysphoric mood.   All other systems reviewed and are negative.      Objective:   Physical Exam Vitals and nursing note reviewed.  Constitutional:      Appearance: Normal appearance.  Neck:     Comments: Cervical Paraspinal Tenderness: C-5- C-6  Cardiovascular:     Rate and Rhythm: Normal rate and regular rhythm.     Pulses: Normal pulses.     Heart sounds: Normal heart sounds.  Pulmonary:     Effort: Pulmonary effort is normal.     Breath sounds: Normal breath sounds.  Musculoskeletal:     Cervical back: Normal range of motion and neck supple.     Comments: Normal Muscle Bulk and Muscle Testing Reveals:  Upper Extremities: Decreased ROM 45 Degrees and Muscle Strength  4/5 Thoracic and Lumbar Hypersensitivity Lower Extremities: Full ROM and Muscle Strength 5/5 Arises from Table Slowly  Narrow Based  Gait       Skin:    General: Skin is warm and dry.  Neurological:     Mental Status: She is alert and oriented to person, place, and time.  Psychiatric:        Mood and Affect: Mood normal.        Behavior: Behavior normal.         Assessment & Plan:  1.Cervicalgia/ Cervical Radiculitis:Continue HEP as Tolerated. Continue Gabapentin 100 mg BID .Continue to monitor. 11/22/2022 Left Arm Radicular Pain: Continue Gabapentin . Continue to Monitor.11/22/2022 2. Chronic Pain Syndrome: Continue  Hydrocodone 10 mg/325 one tablet 4  times a day as  needed for pain #120. We will continue the opioid monitoring program, this consists of regular clinic visits, examinations, urine drug screen, pill counts as well as use of [place] Controlled Substance Reporting system. A 12 month History has been reviewed on the [place] Controlled Substance Reporting System Today.   Continue current medication regimen. Continue to monitor. 11/22/2022 3. Left Hand Pain: Ortho Following. Continue to monitor. 11/22/2022 4. Chronic Thoracic Pain: Continue current medication regimen. Continue to monitor. 11/22/2022 5. Bilateral Greater Trochanter Bursitis:  Continue alternate Ice and Heat Therapy . Continue to monitor. 11/22/2022 6. Left Hand Tremor: Neurology Following. Continue to monitor. 11/22/2022 7 Fall at Home: No Falls this month. Educated on Falls Prevention: She verbalizes understanding. 11/22/2022     F/U in 1 month

## 2022-11-24 ENCOUNTER — Encounter: Payer: Self-pay | Admitting: Registered Nurse

## 2022-11-27 ENCOUNTER — Ambulatory Visit: Payer: Medicaid Other

## 2022-11-28 ENCOUNTER — Other Ambulatory Visit: Payer: Medicaid Other

## 2022-11-28 NOTE — Patient Instructions (Signed)
  Medicaid Managed Care   Unsuccessful Outreach Note  11/28/2022 Name: Bianca Goodman MRN: [id number] DOB: 10/12/1960  Referred by: [name], MD Reason for referral : High Risk Managed Medicaid (MM Social work unsuccessful telephone outreach )   An unsuccessful telephone outreach was attempted today. The patient was referred to the case management team for assistance with care management and care coordination.   Follow Up Plan: The patient has been provided with contact information for the care management team and has been advised to call with any health related questions or concerns.   Abelino Derrick, MHA [facility]  Managed [facility] Social Worker [phone]

## 2022-11-28 NOTE — Patient Outreach (Signed)
  Medicaid Managed Care   Unsuccessful Outreach Note  11/28/2022 Name: Bianca Goodman MRN: [id number] DOB: 09/18/1960  Referred by: Rudd, Stephen M, MD Reason for referral : High Risk Managed Medicaid (MM Social work unsuccessful telephone outreach )   An unsuccessful telephone outreach was attempted today. The patient was referred to the case management team for assistance with care management and care coordination.   Follow Up Plan: The patient has been provided with contact information for the care management team and has been advised to call with any health related questions or concerns.   Wileen Duncanson, BSW, MHA   Managed Medicaid Social Worker [phone]  

## 2022-11-30 ENCOUNTER — Ambulatory Visit: Payer: Medicaid Other | Admitting: Adult Health

## 2022-12-06 ENCOUNTER — Ambulatory Visit: Payer: Medicaid Other

## 2022-12-20 ENCOUNTER — Telehealth: Payer: Self-pay | Admitting: Registered Nurse

## 2022-12-20 MED ORDER — HYDROCODONE-ACETAMINOPHEN 10-325 MG PO TABS: 1.0000 | ORAL_TABLET | Freq: Four times a day (QID) | ORAL | 0 refills | Status: DC | PRN

## 2022-12-20 NOTE — Telephone Encounter (Signed)
Patient called in , has canceled appointment states she is not well. Patient states she has neck pain radiating to her whole body and has tremors , patient said she is running low on Hydrocodone only has 3 pills and she still using the same pharmacy

## 2022-12-20 NOTE — Telephone Encounter (Signed)
Call Placed to [name],  She states she's not feeling well, she was instructed to go to the emergency room to be evaluated, she verbalizes understanding.  PMP was Reviewed, Hydrocodone e-scribed today, she verbalizes understanding. [name] appointment was changed , she verbalizes understanding.

## 2022-12-20 NOTE — Addendum Note (Signed)
Addended by: [name] on: 12/20/2022 11:45 AM   Modules accepted: Orders

## 2022-12-21 ENCOUNTER — Encounter: Payer: Medicaid Other | Attending: Registered Nurse | Admitting: Registered Nurse

## 2023-01-04 ENCOUNTER — Encounter: Payer: Medicaid Other | Admitting: Registered Nurse

## 2023-01-09 ENCOUNTER — Ambulatory Visit: Payer: Medicaid Other | Admitting: Registered Nurse

## 2023-01-19 ENCOUNTER — Encounter: Payer: Medicaid Other | Admitting: Registered Nurse

## 2023-01-19 ENCOUNTER — Telehealth: Payer: Self-pay | Admitting: Registered Nurse

## 2023-01-19 MED ORDER — HYDROCODONE-ACETAMINOPHEN 10-325 MG PO TABS: 1.0000 | ORAL_TABLET | Freq: Four times a day (QID) | ORAL | 0 refills | Status: DC | PRN

## 2023-01-19 NOTE — Telephone Encounter (Signed)
sent 

## 2023-01-19 NOTE — Telephone Encounter (Signed)
[name] called office today, reporting her Transportation didn't pick her up.  Her appointment was re-scheduled, Hydrocodone e-scribed to pharmacy.

## 2023-01-19 NOTE — Telephone Encounter (Signed)
Patients sister called in to reschedule patient's appointment due to transportation , can not come in today and also requesting medication refill on hydrocodone sent to pharmacy on file

## 2023-01-29 ENCOUNTER — Encounter: Payer: Self-pay | Admitting: Registered Nurse

## 2023-01-29 ENCOUNTER — Encounter: Payer: Medicaid Other | Attending: Registered Nurse | Admitting: Registered Nurse

## 2023-01-29 VITALS — BP 125/82 | HR 85 | Ht 62.0 in | Wt 166.0 lb

## 2023-01-29 DIAGNOSIS — M5416 Radiculopathy, lumbar region: Secondary | ICD-10-CM | POA: Diagnosis present

## 2023-01-29 DIAGNOSIS — M546 Pain in thoracic spine: Secondary | ICD-10-CM

## 2023-01-29 DIAGNOSIS — G894 Chronic pain syndrome: Secondary | ICD-10-CM | POA: Diagnosis not present

## 2023-01-29 DIAGNOSIS — Z79891 Long term (current) use of opiate analgesic: Secondary | ICD-10-CM

## 2023-01-29 DIAGNOSIS — M5412 Radiculopathy, cervical region: Secondary | ICD-10-CM

## 2023-01-29 DIAGNOSIS — Z5181 Encounter for therapeutic drug level monitoring: Secondary | ICD-10-CM | POA: Diagnosis present

## 2023-01-29 DIAGNOSIS — G8929 Other chronic pain: Secondary | ICD-10-CM

## 2023-01-29 DIAGNOSIS — M542 Cervicalgia: Secondary | ICD-10-CM

## 2023-01-29 MED ORDER — HYDROCODONE-ACETAMINOPHEN 10-325 MG PO TABS: 1.0000 | ORAL_TABLET | Freq: Four times a day (QID) | ORAL | 0 refills | Status: DC | PRN

## 2023-01-29 NOTE — Progress Notes (Signed)
Subjective:    Patient ID: Bianca Goodman, female    DOB: [birth year], 62 y.o.   MRN: [id number]  HPI: Bianca Goodman is a 62 y.o. female who returns for follow up appointment for chronic pain and medication refill. She states her pain is located in her neck radiating into left shoulder, left arm to her left elbow with tingling and burning, she also reports tingling in her left middle, ring and pinky finger. She also reports mid- lower back pain  . Bianca Goodman was encouraged to obtain X-rays , she verbalizes understanding.  She rates her pain 7. Her current exercise regime is walking and performing stretching exercises with bands. .  Ms Goodman Morphine equivalent is 40.00 MME.   Oral Swab was Performed today.    Ms. Stary mention to this provider she needed a bed, she has been sleeping on a air mattress, she was given a list of Agencies, she verbalizes understanding. Also sent a e-mail to our director, to see if there was any funds to assist Bianca Goodman, she verbalize understanding.     Pain Inventory Average Pain 7 Pain Right Now 7 My pain is constant, sharp, burning, and stabbing  In the last 24 hours, has pain interfered with the following? General activity 7 Relation with others 7 Enjoyment of life 8 What TIME of day is your pain at its worst? morning  and daytime Sleep (in general) Poor  Pain is worse with: walking and standing Pain improves with: rest, heat/ice, and medication Relief from Meds: 6  Family History  Problem Relation Age of Onset   Cancer Mother    Heart disease Father    Heart disease Brother    Heart attack Brother        stent placement   Social History   Socioeconomic History   Marital status: Widowed    Spouse name: Not on file   Number of children: 1   Years of education: Not on file   Highest education level: Not on file  Occupational History   Not on file  Tobacco Use   Smoking status: Former    Current packs/day: 0.00    Types: Cigarettes    Quit  date: 09/20/2019    Years since quitting: 3.3   Smokeless tobacco: Never  Vaping Use   Vaping status: Never Used  Substance and Sexual Activity   Alcohol use: Not Currently   Drug use: Never   Sexual activity: Yes  Other Topics Concern   Not on file  Social History Narrative   Caffeine one cup daily, coffee with milk.   Lives with son.  Widow.  Education HS.     Social Determinants of Health   Financial Resource Strain: Medium Risk (09/25/2022)   Overall Financial Resource Strain (CARDIA)    Difficulty of Paying Living Expenses: Somewhat hard  Food Insecurity: No Food Insecurity (09/25/2022)   Hunger Vital Sign    Worried About Running Out of Food in the Last Year: Never true    Ran Out of Food in the Last Year: Never true  Transportation Needs: No Transportation Needs (09/15/2022)   PRAPARE - Administrator, Civil Service (Medical): No    Lack of Transportation (Non-Medical): No  Physical Activity: Not on file  Stress: Stress Concern Present (09/20/2022)   [employer] of Occupational Health - Occupational Stress Questionnaire    Feeling of Stress : To some extent  Social Connections: Not on file   Past Surgical History:  Procedure Laterality Date   APPENDECTOMY     1997   CERVICAL SPINE SURGERY     C4-5 spinal fusion   RIGHT OOPHORECTOMY     scalenectomy     1985   Past Surgical History:  Procedure Laterality Date   APPENDECTOMY     1997   CERVICAL SPINE SURGERY     C4-5 spinal fusion   RIGHT OOPHORECTOMY     scalenectomy     1985   Past Medical History:  Diagnosis Date   Endometriosis    Hypertension    Migraines    Parkinson's disease    Thyroid disease    BP 125/82   Pulse 85   Ht 5\' 2"  (1.575 m)   Wt 166 lb (75.3 kg)   SpO2 99%   BMI 30.36 kg/m   Opioid Risk Score:   Fall Risk Score:  `1  Depression screen [facility] 2/9     01/29/2023   12:57 PM 11/01/2022   10:03 AM 09/15/2022   11:13 AM 09/06/2022   10:18 AM 08/02/2022    9:59 AM  06/15/2022    9:23 AM 05/05/2022    9:53 AM  Depression screen PHQ 2/9  Decreased Interest 3 3 1 1 1 1  0  Down, Depressed, Hopeless 2 2 1 1  1  0  PHQ - 2 Score 5 5 2 2 1 2  0  Altered sleeping  3 1  1     Tired, decreased energy  3 3      Change in appetite  1 0      Feeling bad or failure about yourself   2 0      Trouble concentrating  0 0      Moving slowly or fidgety/restless  2 0      Suicidal thoughts   0      PHQ-9 Score  16 6  2     Difficult doing work/chores   Somewhat difficult         Review of Systems  Musculoskeletal:  Positive for back pain and neck pain.       LT arm pain  All other systems reviewed and are negative.      Objective:   Physical Exam Vitals and nursing note reviewed.  Constitutional:      Appearance: Normal appearance.  Neck:     Comments: Cervical Paraspinal Tenderness: C-5-C-6  Cardiovascular:     Rate and Rhythm: Normal rate and regular rhythm.     Pulses: Normal pulses.     Heart sounds: Normal heart sounds.  Pulmonary:     Effort: Pulmonary effort is normal.     Breath sounds: Normal breath sounds.  Musculoskeletal:     Cervical back: Normal range of motion and neck supple.     Comments: Normal Muscle Bulk and Muscle Testing Reveals:  Upper Extremities: Right: Decreased ROM 90 Degrees  and Muscle Strength 4/5 Left Upper extremity: Decreased ROM 45 Degrees  and Muscle Strength 4/5 Bilateral AC Joint Tenderness  Thoracic Paraspinal Tenderness: T-7- T-9  Lumbar Paraspinal Tenderness: L-4-L-5 Left Greater Trochanter tenderness Lower Extremities: Right Full ROM and Muscle Strength 5/5 Left Lower Extremity Flexion Produces Pain into her Left Hip Arises from Chair slowly Narrow Based  Gait     Skin:    General: Skin is warm and dry.  Neurological:     Mental Status: She is alert and oriented to person, place, and time.  Psychiatric:        Mood  and Affect: Mood normal.        Behavior: Behavior normal.         Assessment &  Plan:  1.Cervicalgia/ Cervical Radiculitis:Continue HEP as Tolerated. Continue Pregabalin .Continue to monitor. 01/29/2023 Left Arm Radicular Pain: Continue Pregabalin. Continue to Monitor.01/29/2023 2. Chronic Pain Syndrome: Continue Hydrocodone 10 mg/325 one tablet 4  times a day as needed for pain #120. We will continue the opioid monitoring program, this consists of regular clinic visits, examinations, urine drug screen, pill counts as well as use of [place] Controlled Substance Reporting system. A 12 month History has been reviewed on the [place] Controlled Substance Reporting System Today.   Continue current medication regimen. Continue to monitor. 01/29/2023 3. Left Hand Pain: Ortho Following. Continue to monitor. 01/29/2023 4. Chronic Thoracic Pain: Continue current medication regimen. Continue to monitor. 01/29/2023 5. Bilateral Greater Trochanter Bursitis:  Continue alternate Ice and Heat Therapy . Continue to monitor. 01/29/2023 6. Left Hand Tremor: Neurology Following. Continue to monitor. 01/29/2023      F/U in 1 month

## 2023-02-03 LAB — DRUG TOX MONITOR 1 W/CONF, ORAL FLD
Amphetamines: NEGATIVE ng/mL (ref <10)
Barbiturates: NEGATIVE ng/mL (ref <10)
Benzodiazepines: NEGATIVE ng/mL (ref <0.50)
Buprenorphine: NEGATIVE ng/mL (ref <0.10)
Cocaine: NEGATIVE ng/mL (ref <5.0)
Codeine: NEGATIVE ng/mL (ref <2.5)
Dihydrocodeine: 5 ng/mL — ABNORMAL HIGH (ref <2.5)
Fentanyl: NEGATIVE ng/mL (ref <0.10)
Heroin Metabolite: NEGATIVE ng/mL (ref <1.0)
Hydrocodone: 127.8 ng/mL — ABNORMAL HIGH (ref <2.5)
Hydromorphone: NEGATIVE ng/mL (ref <2.5)
MARIJUANA: NEGATIVE ng/mL (ref <2.5)
MDMA: NEGATIVE ng/mL (ref <10)
Meprobamate: NEGATIVE ng/mL (ref <2.5)
Methadone: NEGATIVE ng/mL (ref <5.0)
Morphine: NEGATIVE ng/mL (ref <2.5)
Nicotine Metabolite: NEGATIVE ng/mL (ref <5.0)
Norhydrocodone: 2.5 ng/mL — ABNORMAL HIGH (ref <2.5)
Noroxycodone: NEGATIVE ng/mL (ref <2.5)
Opiates: POSITIVE ng/mL — AB (ref <2.5)
Oxycodone: NEGATIVE ng/mL (ref <2.5)
Oxymorphone: NEGATIVE ng/mL (ref <2.5)
Phencyclidine: NEGATIVE ng/mL (ref <10)
Tapentadol: NEGATIVE ng/mL (ref <5.0)
Tramadol: NEGATIVE ng/mL (ref <5.0)
Zolpidem: NEGATIVE ng/mL (ref <5.0)

## 2023-02-03 LAB — DRUG TOX ALC METAB W/CON, ORAL FLD: Alcohol Metabolite: NEGATIVE ng/mL (ref <25)

## 2023-02-14 ENCOUNTER — Ambulatory Visit: Payer: Medicaid Other | Admitting: Family Medicine

## 2023-02-22 ENCOUNTER — Telehealth: Payer: Self-pay | Admitting: Registered Nurse

## 2023-02-22 NOTE — Telephone Encounter (Signed)
Patient called in and states she has not done her xray , the latest xray order that I see is dated 08/2022 . She wants to go and complete it but states she having R hip pain and neck pain and not just back pain. She would like to go ahead and do xray of her hip and neck as well and asked if it could be added.   Patient requested to mail her the address of [facility] so she can schedule transportation

## 2023-02-22 NOTE — Telephone Encounter (Signed)
Orders were placed to go to [facility]. I will mail the address.

## 2023-03-12 ENCOUNTER — Telehealth: Payer: Self-pay | Admitting: Registered Nurse

## 2023-03-12 NOTE — Telephone Encounter (Signed)
Patient called in to reschedule appointment , will have a bed delivered and is unable to come. Patient states there is a program within [place] that will help them financially that her son would like to apply to but she needs a letter stating the her son Beatrice Lecher helps patients and that she lives with him and is the one that takes care of her. Include the address and also asked if diagnosis could be provided in that letter.  Patient would like letter mailed out to her  Patient also requested medication refill for Hydrocodone sent to [facility] on file, will run out by 03/16/23, patient was rescheduled to 11/20

## 2023-03-13 ENCOUNTER — Telehealth: Payer: Self-pay | Admitting: Registered Nurse

## 2023-03-13 ENCOUNTER — Encounter: Payer: Medicaid Other | Admitting: Registered Nurse

## 2023-03-13 MED ORDER — HYDROCODONE-ACETAMINOPHEN 10-325 MG PO TABS: 1.0000 | ORAL_TABLET | Freq: Four times a day (QID) | ORAL | 0 refills | Status: DC | PRN

## 2023-03-13 NOTE — Telephone Encounter (Signed)
PMP was Reviewed, ' Hydrocodone e-scribed to pharmacy.  Letter written as requested , a call placed to Ms. Big regarding the above, she verbalizes understanding.

## 2023-03-15 NOTE — Telephone Encounter (Signed)
PA submitted for Hydrocodone 

## 2023-03-16 NOTE — Telephone Encounter (Signed)
Approved #[id number] 03/15/23-09/11/23

## 2023-03-30 ENCOUNTER — Telehealth: Payer: Self-pay | Admitting: Registered Nurse

## 2023-03-30 NOTE — Telephone Encounter (Signed)
Letter for Bianca Goodman

## 2023-04-02 ENCOUNTER — Ambulatory Visit: Payer: Medicaid Other | Admitting: Adult Health

## 2023-04-02 ENCOUNTER — Encounter: Payer: Self-pay | Admitting: Adult Health

## 2023-04-02 VITALS — BP 133/77 | HR 75 | Ht 63.0 in | Wt 168.0 lb

## 2023-04-02 DIAGNOSIS — G20A2 Parkinson's disease without dyskinesia, with fluctuations: Secondary | ICD-10-CM | POA: Diagnosis not present

## 2023-04-02 NOTE — Progress Notes (Signed)
[facility] [address] [place]. [place] [address] [phone]       OFFICE FOLLOW UP NOTE  [name] Date of Birth:  [birth year] Medical Record Number:  [id number]    Primary neurologist: Dr. Frances Furbish Reason for visit: Parkinsonism    SUBJECTIVE:   CHIEF COMPLAINT:  Chief Complaint  Patient presents with   Follow-up    Patient in room #8 and alone. Patient states when getting up and taking her first step she feels imbalance where she falling backward. Patient states she has a lot of pain in her neck.    HPI:   Bianca Goodman is a 62 y.o. female who is routinely followed in office for left-sided predominance parkinsonism with symptom onset around 2021.  Initially started on pramipexole in 2022 and started on carbidopa levodopa 06/2022 with complaints of worsening tremor and stiffness.   At prior visit, increased carbidopa levodopa to 1 pill 4 times daily and continued on pramipexole 1 pill TID. Noted swallowing difficulty, planned to continue to monitor, consider swallowing study if needed.  Interval history:   Patient unaccompanied today, declined interpreter services. Overall stable since prior visit. She apparently did not increase Sinemet dosage as previously advised, she continues to take 1 tab upon awakening, then 1pm and 7pm, takes pramipexole at the same times. She does c/o increased stiffness and gait difficulty, essentially unchanged since prior visit.  She questions use of RW, currently without use of AD, denies any recent falls. She does have difficulty standing from seated position especially from places like her bed and toilet as they do not have hand rails. Continues imbalance greater when looking up or down such as drinking water. Swallowing difficulty persists but denies worsening. Her main concern today is in regards to continued neck pain, she is currently being followed by PMR for pain management but denies much improvement, neck pain greatly  interferes with daily activity as well as sleep.     ROS:   14 system review of systems performed and negative with exception of those listed in HPI  PMH:  Past Medical History:  Diagnosis Date   Endometriosis    Hypertension    Migraines    Parkinson's disease (HCC)    Thyroid disease     PSH:  Past Surgical History:  Procedure Laterality Date   APPENDECTOMY     1997   CERVICAL SPINE SURGERY     C4-5 spinal fusion   RIGHT OOPHORECTOMY     scalenectomy     1985    Social History:  Social History   Socioeconomic History   Marital status: Widowed    Spouse name: Not on file   Number of children: 1   Years of education: Not on file   Highest education level: Not on file  Occupational History   Not on file  Tobacco Use   Smoking status: Former    Current packs/day: 0.00    Types: Cigarettes    Quit date: 09/20/2019    Years since quitting: 3.5   Smokeless tobacco: Never  Vaping Use   Vaping status: Never Used  Substance and Sexual Activity   Alcohol use: Not Currently   Drug use: Never   Sexual activity: Yes  Other Topics Concern   Not on file  Social History Narrative   Caffeine one cup daily, coffee with milk.   Lives with son.  Widow.  Education HS.     Social Determinants of Health   Financial Resource Strain:  Medium Risk (09/25/2022)   Overall Financial Resource Strain (CARDIA)    Difficulty of Paying Living Expenses: Somewhat hard  Food Insecurity: No Food Insecurity (09/25/2022)   Hunger Vital Sign    Worried About Running Out of Food in the Last Year: Never true    Ran Out of Food in the Last Year: Never true  Transportation Needs: No Transportation Needs (09/15/2022)   PRAPARE - Administrator, Civil Service (Medical): No    Lack of Transportation (Non-Medical): No  Physical Activity: Not on file  Stress: Stress Concern Present (09/20/2022)   [employer] of Occupational Health - Occupational Stress Questionnaire    Feeling of  Stress : To some extent  Social Connections: Not on file  Intimate Partner Violence: Not At Risk (09/25/2022)   Humiliation, Afraid, Rape, and Kick questionnaire    Fear of Current or Ex-Partner: No    Emotionally Abused: No    Physically Abused: No    Sexually Abused: No    Family History:  Family History  Problem Relation Age of Onset   Cancer Mother    Heart disease Father    Heart disease Brother    Heart attack Brother        stent placement    Medications:   Current Outpatient Medications on File Prior to Visit  Medication Sig Dispense Refill   albuterol (VENTOLIN HFA) 108 (90 Base) MCG/ACT inhaler Inhale 2 puffs into the lungs every 6 (six) hours as needed for wheezing or shortness of breath. 6.7 g 2   carbidopa-levodopa (SINEMET IR) 25-100 MG tablet Take 1 tablet by mouth 4 (four) times daily. 8:30 AM, 12:30 PM, 4:30 PM, and 8:30 PM daily 360 tablet 3   cyanocobalamin (VITAMIN B12) 1000 MCG tablet Take 1 tablet (1,000 mcg total) by mouth daily. 90 tablet 3   HYDROcodone-acetaminophen (NORCO) 10-325 MG tablet Take 1 tablet by mouth every 6 (six) hours as needed. Do not fill before 02/16/2023 120 tablet 0   hydroxypropyl methylcellulose / hypromellose (ISOPTO TEARS / GONIOVISC) 2.5 % ophthalmic solution Place 1 drop into both eyes 3 (three) times daily as needed for dry eyes. 15 mL 12   levothyroxine (SYNTHROID) 88 MCG tablet Take 1 tablet (88 mcg total) by mouth daily. 90 tablet 3   losartan (COZAAR) 50 MG tablet Take 1 tablet (50 mg total) by mouth daily. 90 tablet 3   Polyvinyl Alcohol-Povidone (ARTIFICIAL TEARS) 5-6 MG/ML SOLN Place 1 drop into both eyes 3 (three) times daily as needed. 30 mL 5   pramipexole (MIRAPEX) 1 MG tablet Take 1 tablet (1 mg total) by mouth 3 (three) times daily. 270 tablet 3   pregabalin (LYRICA) 25 MG capsule Take 1 capsule (25 mg total) by mouth at bedtime. 30 capsule 3   verapamil (CALAN) 120 MG tablet Take 1 tablet (120 mg total) by mouth 2 (two)  times daily. 180 tablet 3   Vitamin D, Ergocalciferol, (DRISDOL) 1.25 MG (50000 UNIT) CAPS capsule Take 1 capsule (50,000 Units total) by mouth every 7 (seven) days. 12 capsule 1   No current facility-administered medications on file prior to visit.    Allergies:   Allergies  Allergen Reactions   Cortizone-10 [Hydrocortisone] Rash   Tramadol Nausea Only      OBJECTIVE:  Physical Exam  Vitals:   04/02/23 1344  BP: 133/77  Pulse: 75  Weight: 168 lb (76.2 kg)  Height: 5\' 3"  (1.6 m)   Body mass index is 29.76 kg/m.  No results found.  General: well developed, well nourished, very pleasant middle-aged female, seated, in no evident distress  Neurologic Exam Mental Status: Awake and fully alert.  Fluent speech and language.  Oriented to place and time. Recent and remote memory intact. Attention span, concentration and fund of knowledge appropriate. Mood and affect appropriate. Mild to moderate facial masking.  Cranial Nerves: Pupils equal, briskly reactive to light. Extraocular movements full without nystagmus. Visual fields full to confrontation. Hearing intact. Facial sensation intact. Face, tongue, palate moves normally and symmetrically.  Motor: Full strength in all tested extremities.  Increased tone LUE>RUE with mild cogwheel rigidity, difficulty extending fingers with increased stiffness and pain L>R, and mild increased tone in LLE.  Intermittent mild resting tremor in LUE, no significant postural action tremor.  Moderately decreased fine motor LUE, mild on RUE. Sensory.: intact to touch , pinprick , position and vibratory sensation.  Gait and Station: Arises from chair with mild difficulty. Stance is hunched. Gait demonstrates decreased stride length and step height bilaterally with mild unsteadiness, decreased arm swing L>R.  Tandem walk and heel toe not attempted. Reflexes: 1+ and symmetric. Toes downgoing.       ASSESSMENT/PLAN: Reveille Guhl is a 62 y.o. year old female  with left-sided predominance parkinsonism with symptoms starting around 2021.  Prior concern of gradual worsening of tremor, stiffness and freezing spells, previously recommended increasing Sinemet dosage to 1 pill 4 times daily but remains on 1 pill 3 times daily. Continues to have issues with tremor, stiffness and freezing spells but denies worsening since prior visit.    -Recommend increasing Sinemet to 1 pill 4 times daily as previously advised -Continue pramipexole 1 pill 3 times daily -Referral placed to PT for Parkinson's disease, they will determine if AD required, provided DME for toilet safety rails -Continue to follow with pain management, advised to discuss with PCP referral to different location for second opinion as she continues to have significant pain which interferes with daily functioning and sleep    Follow up in 6 months or call earlier if needed   CC:  PCP: [name], MD    I spent 40 minutes of face-to-face and non-face-to-face time with patient.  This included previsit chart review, lab review, study review, order entry, electronic health record documentation, patient education and discussion regarding the above and answered all the questions to patient's satisfaction  Ihor Austin, [facility]  [facility] [address] [place], [place] [address]  Phone [phone] Fax [phone] Note: This document was prepared with digital dictation and possible smart phrase technology. Any transcriptional errors that result from this process are unintentional.

## 2023-04-02 NOTE — Patient Instructions (Addendum)
Your Plan:  Referral placed to neuro rehab physical therapy - if you are not called to schedule by Thursday, please call them to schedule   Continue pramipexole 1 pill three times daily  Increase Sinemet to 1 pill four times daily - start at 8:30am, then take at 12:30 PM, then 4:30 PM, and then 8:30 PM - this can further help with stiffness sensation  Order for home medical equipment for toilet safety rail, can bring to any local medical supply company or search online for online medical supplies companies which can be shipped directly to your home  Continue to follow with pain management as scheduled     Follow up in 6 months or call earlier if needed      Thank you for coming to see [place] at [facility]. I hope we have been able to provide you high quality care today.  You may receive a patient satisfaction survey over the next few weeks. We would appreciate your feedback and comments so that we may continue to improve ourselves and the health of our patients.

## 2023-04-11 ENCOUNTER — Encounter: Payer: Medicaid Other | Attending: Registered Nurse | Admitting: Registered Nurse

## 2023-04-11 VITALS — BP 110/74 | HR 98 | Ht 63.0 in | Wt 164.0 lb

## 2023-04-11 DIAGNOSIS — M5412 Radiculopathy, cervical region: Secondary | ICD-10-CM | POA: Diagnosis not present

## 2023-04-11 DIAGNOSIS — Z79891 Long term (current) use of opiate analgesic: Secondary | ICD-10-CM | POA: Insufficient documentation

## 2023-04-11 DIAGNOSIS — G894 Chronic pain syndrome: Secondary | ICD-10-CM | POA: Diagnosis not present

## 2023-04-11 DIAGNOSIS — Z5181 Encounter for therapeutic drug level monitoring: Secondary | ICD-10-CM | POA: Insufficient documentation

## 2023-04-11 DIAGNOSIS — M542 Cervicalgia: Secondary | ICD-10-CM | POA: Insufficient documentation

## 2023-04-11 DIAGNOSIS — G8929 Other chronic pain: Secondary | ICD-10-CM | POA: Insufficient documentation

## 2023-04-11 DIAGNOSIS — M546 Pain in thoracic spine: Secondary | ICD-10-CM | POA: Insufficient documentation

## 2023-04-11 DIAGNOSIS — M5416 Radiculopathy, lumbar region: Secondary | ICD-10-CM | POA: Diagnosis present

## 2023-04-11 MED ORDER — HYDROCODONE-ACETAMINOPHEN 10-325 MG PO TABS: 1.0000 | ORAL_TABLET | Freq: Four times a day (QID) | ORAL | 0 refills | Status: DC | PRN

## 2023-04-11 NOTE — Progress Notes (Signed)
Subjective:    Patient ID: Bianca Goodman, female    DOB: [birth year], 62 y.o.   MRN: [id number]  HPI: Bianca Goodman is a 62 y.o. female who returns for follow up appointment for chronic pain and medication refill. She states her pain is located in her neck radiating into her left shoulder, mid- lower back radiating into her left lower extremity. She rates her pain 8. Her current exercise regime is walking and performing stretching exercises.  Bianca Goodman Morphine equivalent is 40.00 MME.   Last UDS was Performed on 01/29/2023, it was consistent.     Pain Inventory Average Pain 8 Pain Right Now 8 My pain is sharp, burning, and tingling  In the last 24 hours, has pain interfered with the following? General activity 6 Relation with others 6 Enjoyment of life 6 What TIME of day is your pain at its worst? morning  and daytime Sleep (in general) Poor  Pain is worse with: walking and standing Pain improves with: rest, heat/ice, and medication Relief from Meds: 5  Family History  Problem Relation Age of Onset   Cancer Mother    Heart disease Father    Heart disease Brother    Heart attack Brother        stent placement   Social History   Socioeconomic History   Marital status: Widowed    Spouse name: Not on file   Number of children: 1   Years of education: Not on file   Highest education level: Not on file  Occupational History   Not on file  Tobacco Use   Smoking status: Former    Current packs/day: 0.00    Types: Cigarettes    Quit date: 09/20/2019    Years since quitting: 3.5   Smokeless tobacco: Never  Vaping Use   Vaping status: Never Used  Substance and Sexual Activity   Alcohol use: Not Currently   Drug use: Never   Sexual activity: Yes  Other Topics Concern   Not on file  Social History Narrative   Caffeine one cup daily, coffee with milk.   Lives with son.  Widow.  Education HS.     Social Determinants of Health   Financial Resource Strain: Medium Risk  (09/25/2022)   Overall Financial Resource Strain (CARDIA)    Difficulty of Paying Living Expenses: Somewhat hard  Food Insecurity: No Food Insecurity (09/25/2022)   Hunger Vital Sign    Worried About Running Out of Food in the Last Year: Never true    Ran Out of Food in the Last Year: Never true  Transportation Needs: No Transportation Needs (09/15/2022)   PRAPARE - Administrator, Civil Service (Medical): No    Lack of Transportation (Non-Medical): No  Physical Activity: Not on file  Stress: Stress Concern Present (09/20/2022)   [employer] of Occupational Health - Occupational Stress Questionnaire    Feeling of Stress : To some extent  Social Connections: Not on file   Past Surgical History:  Procedure Laterality Date   APPENDECTOMY     1997   CERVICAL SPINE SURGERY     C4-5 spinal fusion   RIGHT OOPHORECTOMY     scalenectomy     1985   Past Surgical History:  Procedure Laterality Date   APPENDECTOMY     1997   CERVICAL SPINE SURGERY     C4-5 spinal fusion   RIGHT OOPHORECTOMY     scalenectomy     1985   Past Medical  History:  Diagnosis Date   Endometriosis    Hypertension    Migraines    Parkinson's disease (HCC)    Thyroid disease    BP 110/74   Pulse 98   Ht 5\' 3"  (1.6 m)   Wt 164 lb (74.4 kg)   SpO2 98%   BMI 29.05 kg/m   Opioid Risk Score:   Fall Risk Score:  `1  Depression screen [facility] 2/9     01/29/2023   12:57 PM 11/01/2022   10:03 AM 09/15/2022   11:13 AM 09/06/2022   10:18 AM 08/02/2022    9:59 AM 06/15/2022    9:23 AM 05/05/2022    9:53 AM  Depression screen PHQ 2/9  Decreased Interest 3 3 1 1 1 1  0  Down, Depressed, Hopeless 2 2 1 1  1  0  PHQ - 2 Score 5 5 2 2 1 2  0  Altered sleeping  3 1  1     Tired, decreased energy  3 3      Change in appetite  1 0      Feeling bad or failure about yourself   2 0      Trouble concentrating  0 0      Moving slowly or fidgety/restless  2 0      Suicidal thoughts   0      PHQ-9 Score  16 6   2     Difficult doing work/chores   Somewhat difficult         Review of Systems  Musculoskeletal:  Positive for back pain and neck pain.  All other systems reviewed and are negative.     Objective:   Physical Exam Vitals and nursing note reviewed.  Constitutional:      Appearance: Normal appearance.  Neck:     Comments: Cervical Paraspinal Tenderness: C-5-C-6: Mainly Left Side  Cardiovascular:     Rate and Rhythm: Normal rate and regular rhythm.     Pulses: Normal pulses.     Heart sounds: Normal heart sounds.  Pulmonary:     Effort: Pulmonary effort is normal.     Breath sounds: Normal breath sounds.  Musculoskeletal:     Comments: Normal Muscle Bulk and Muscle Testing Reveals:  Upper Extremities: Decreased ROM 90 Degrees  and Muscle Strength 4/5 Thoracic Paraspinal Tenderness: T-7-T-9 Lumbar Paraspinal Tenderness: L-4-L-5 Lower Extremities: Decreased ROM and Muscle Strength 5/5 Bilateral Lower Extremities Flexion Produces Pain into her Popliteal Fossa Arises from Table Slowly Antalgic Gait     Skin:    General: Skin is warm and dry.  Neurological:     Mental Status: She is alert and oriented to person, place, and time.  Psychiatric:        Mood and Affect: Mood normal.        Behavior: Behavior normal.         Assessment & Plan:  1.Cervicalgia/ Cervical Radiculitis:Continue HEP as Tolerated. Continue Pregabalin .Continue to monitor. 04/11/2023 Left Arm Radicular Pain: Continue Pregabalin. Continue to Monitor.04/11/2023 2. Chronic Pain Syndrome: Continue Hydrocodone 10 mg/325 one tablet 4  times a day as needed for pain #120. We will continue the opioid monitoring program, this consists of regular clinic visits, examinations, urine drug screen, pill counts as well as use of [place] Controlled Substance Reporting system. A 12 month History has been reviewed on the [place] Controlled Substance Reporting System Today.   Continue current medication regimen.  Continue to monitor. 04/11/2023 3. Left Hand Pain: Ortho Following.  Continue to monitor. 04/11/2023 4. Chronic Thoracic Pain: Continue current medication regimen. Continue to monitor. 04/11/2023 5. Bilateral Greater Trochanter Bursitis:  Continue alternate Ice and Heat Therapy . Continue to monitor. 04/11/2023 6. Left Hand Tremor: Neurology Following. Continue to monitor. 04/11/2023       F/U in 1 month

## 2023-04-14 ENCOUNTER — Encounter: Payer: Self-pay | Admitting: Registered Nurse

## 2023-05-04 IMAGING — MR MR CERVICAL SPINE W/O CM
4 of 5 series · 23 of 48 positions shown · non-contrast
Comparison: None available.

CLINICAL DATA: Initial evaluation for left upper extremity
radicular pain.

EXAM:
MRI CERVICAL SPINE WITHOUT CONTRAST
TECHNIQUE: Multiplanar, multisequence MR imaging of the cervical spine was
performed. No intravenous contrast was administered.

[Series 3: T2 · sagittal · 3.0mm · 0.69mm/px · 6 of 13 slices shown (1 of 2)]
[im 1/13]
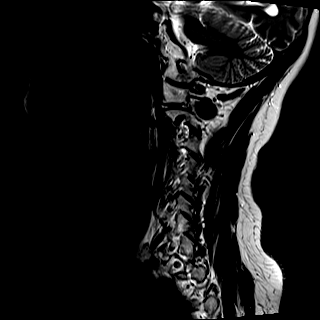
[im 3/13]
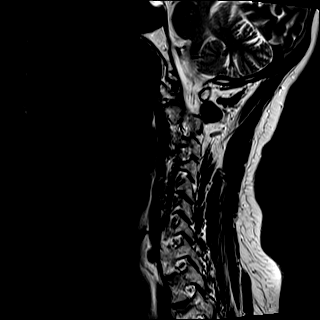
[im 5/13]
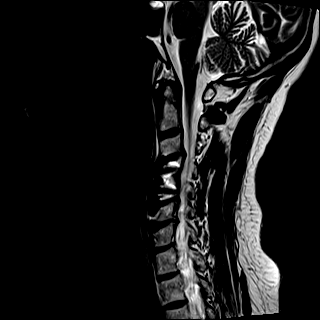
[im 8/13]
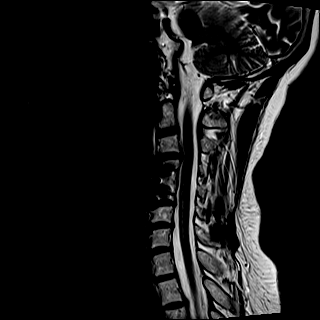
[im 10/13]
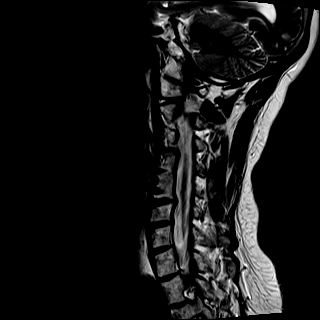
[im 13/13]
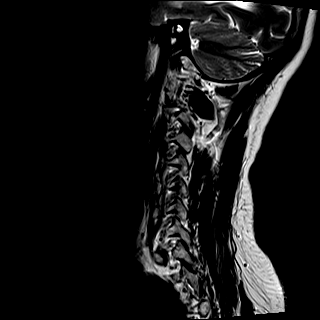

[Series 4: T1 · sagittal · 3.0mm · 0.86mm/px · 5 of 13 slices shown]
[im 1/13]
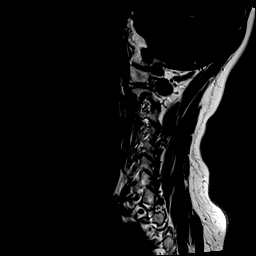
[im 3/13]
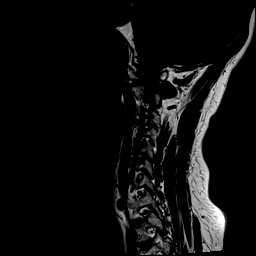
[im 5/13]
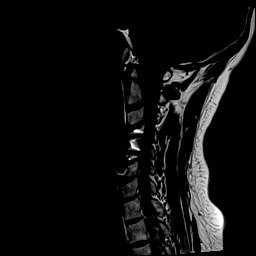
[im 8/13]
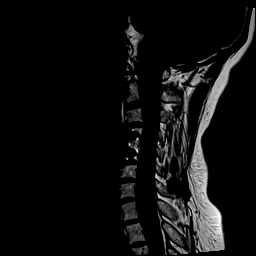
[im 13/13]
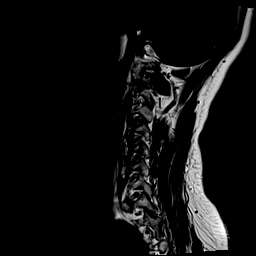

[Series 5: STIR · sagittal · 3.0mm · 0.69mm/px · 3 of 13 slices shown]
[im 3/13]
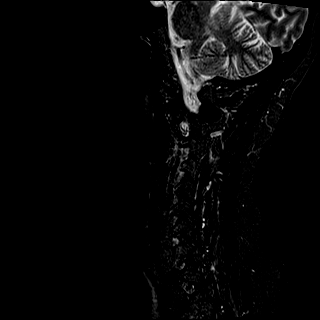
[im 8/13]
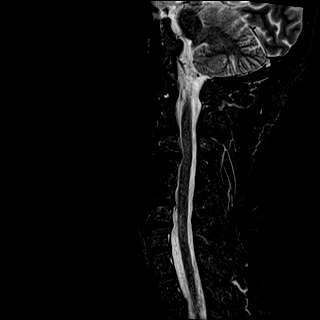
[im 13/13]
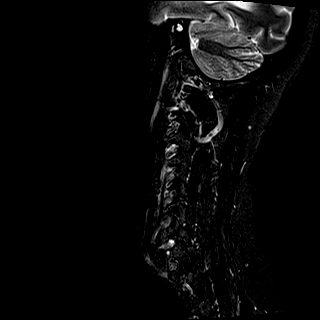

[Series 6: T2 · axial · 3.0mm · 0.28mm/px · z∈[-102,+8]mm · 9 of 32 slices shown (2 of 2)]
[im 1/32]
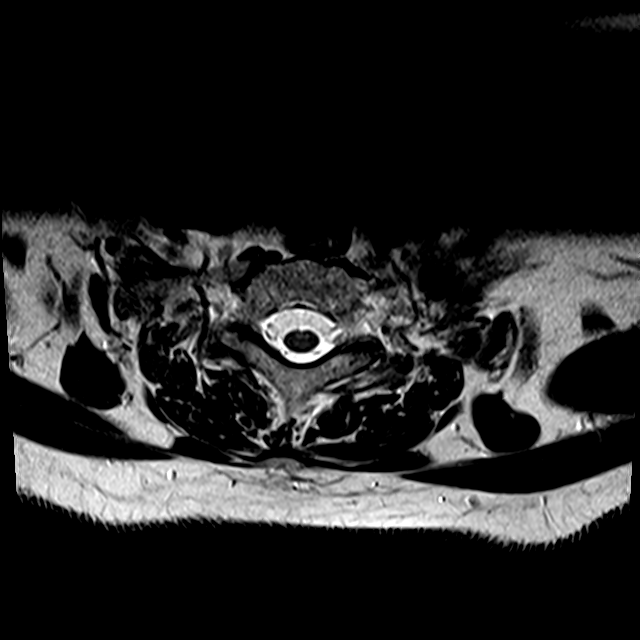
[im 5/32]
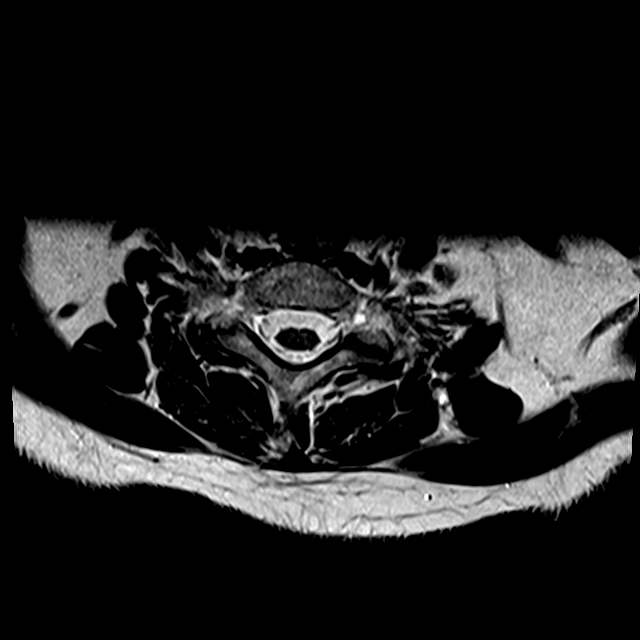
[im 9/32]
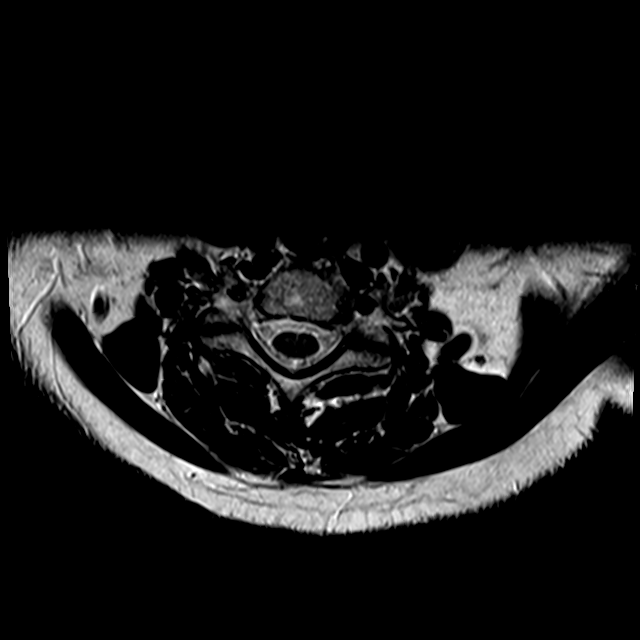
[im 14/32]
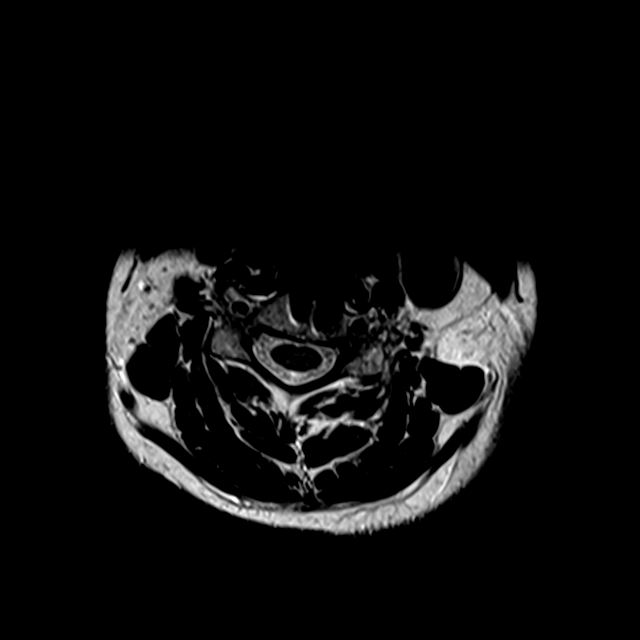
[im 16/32]
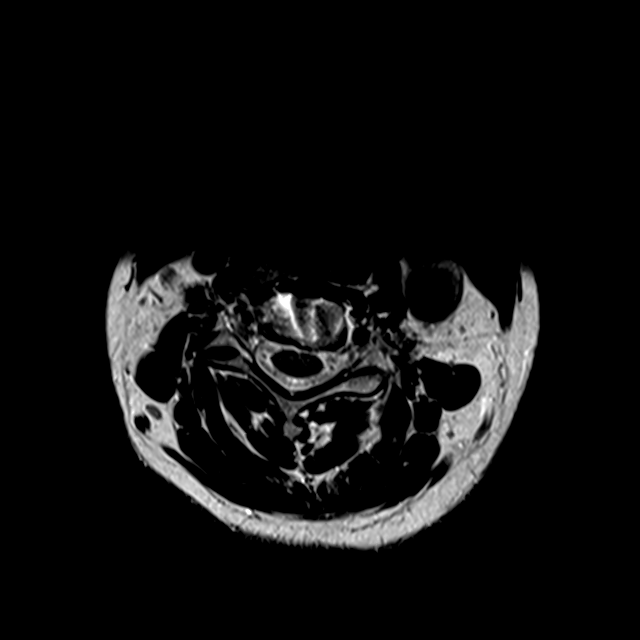
[im 18/32]
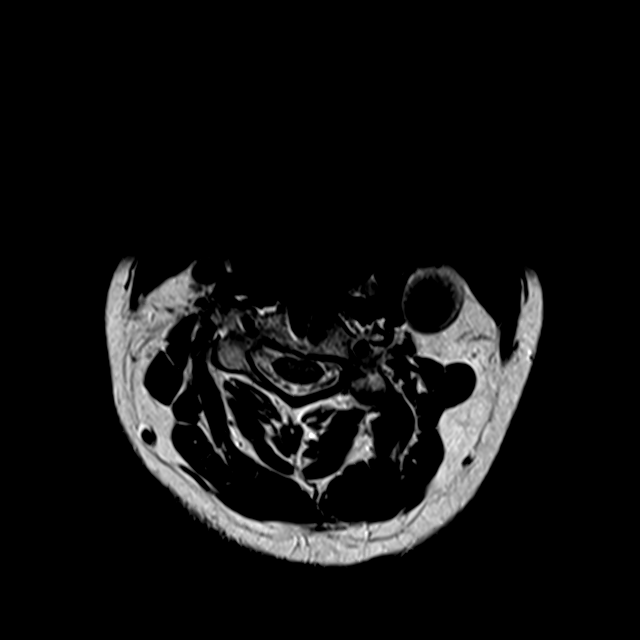
[im 23/32]
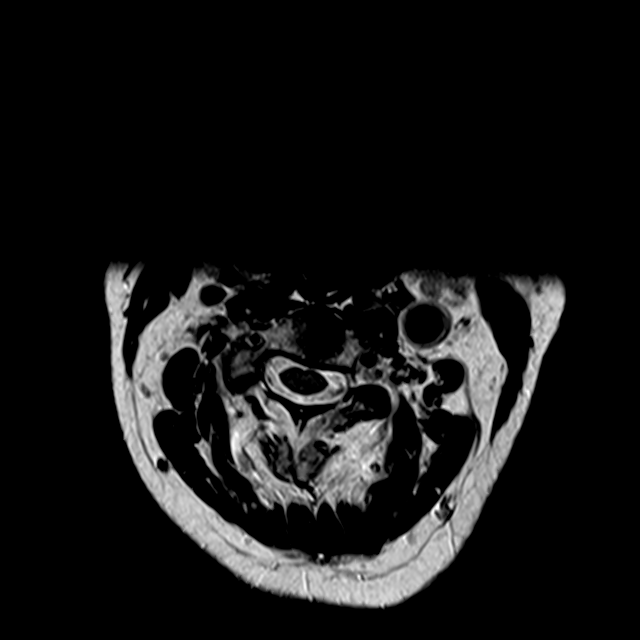
[im 27/32]
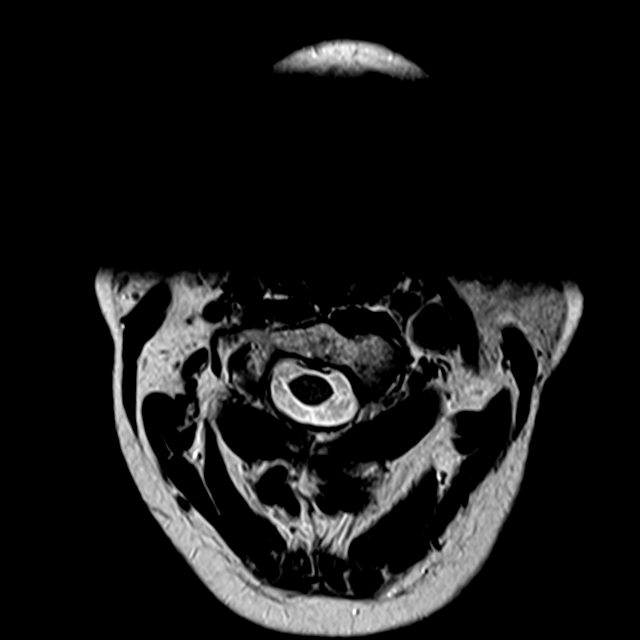
[im 32/32]
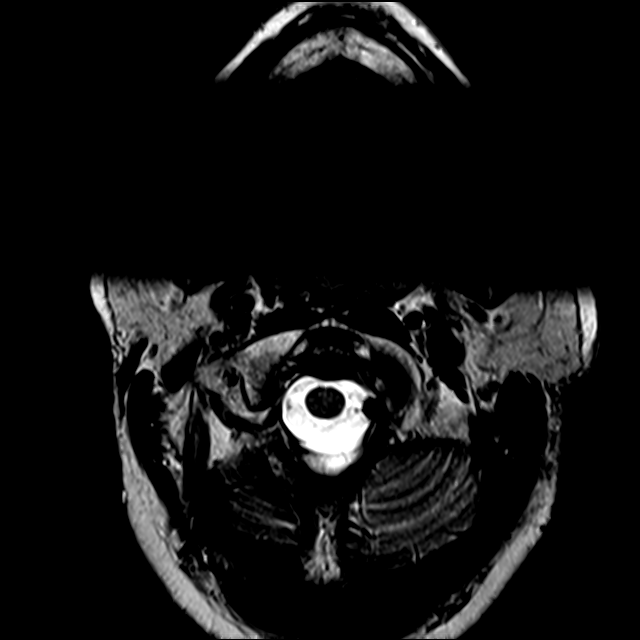

[23 of 48 positions shown; findings below may reference images not displayed]

FINDINGS: Alignment: Dextroscoliosis. Straightening with reversal of the
normal cervical lordosis, apex at C3-4. No listhesis.

Vertebrae: Prior ACDF at C4-5 with solid arthrodesis. Vertebral body
height maintained without acute or chronic fracture. Bone marrow
signal intensity within normal limits. No discrete or worrisome
osseous lesions or abnormal marrow edema.

Cord: Normal signal morphology.

Posterior Fossa, vertebral arteries, paraspinal tissues: Probable
mild chronic microvascular ischemic disease noted within the pons.
Visualized brain and posterior fossa otherwise unremarkable.
Craniocervical junction normal. Paraspinous and prevertebral soft
tissues within normal limits. Normal flow voids seen within the
vertebral arteries bilaterally.

Disc levels:

C2-C3: Negative interspace. Minimal facet hypertrophy. No canal or
foraminal stenosis.

C3-C4: Diffuse disc bulge with bilateral uncovertebral hypertrophy.
Bulging disc flattens and partially faces the ventral thecal sac
with resultant mild spinal stenosis. Minimal flattening of the
ventral cord without cord signal changes. Mild uncovertebral
hypertrophy without significant foraminal encroachment.

C4-C5:  Prior fusion.  No residual canal or foraminal stenosis.

C5-C6: Degenerative intervertebral disc space narrowing with diffuse
disc bulge and bilateral uncovertebral spurring. Flattening of the
ventral thecal sac without significant spinal stenosis or cord
deformity. Resultant mild to moderate left with mild right C6
foraminal narrowing.

C6-C7: Disc bulge with left greater than right uncinate spurring. No
spinal stenosis. Moderate left C7 foraminal narrowing. This is
suspected to be the symptomatic level. Right neural foramina remains
widely patent.

C7-T1: Negative interspace. Mild left greater than right facet
hypertrophy. No canal or foraminal stenosis.

Visualized upper thoracic spine demonstrates no significant finding.
IMPRESSION: 1. Disc bulge with uncovertebral spurring at C6-7 with resultant
moderate left C7 foraminal stenosis. This is suspected to be the
symptomatic level.
2. Degenerative disc bulge with uncovertebral spurring at C5-6 with
resultant mild to moderate left and mild right C6 foraminal
stenosis.
3. Broad-based disc bulge at C3-4 with resultant mild spinal
stenosis.
4. Prior ACDF at C4-5 without residual or recurrent stenosis.

## 2023-05-07 ENCOUNTER — Telehealth: Payer: Self-pay | Admitting: Registered Nurse

## 2023-05-07 MED ORDER — HYDROCODONE-ACETAMINOPHEN 10-325 MG PO TABS: 1.0000 | ORAL_TABLET | Freq: Four times a day (QID) | ORAL | 0 refills | Status: DC | PRN

## 2023-05-07 NOTE — Telephone Encounter (Signed)
Patient called to reschedule , she is not feeling well states she has been having fever and does not feel like she can make it in , patient has been rescheduled but will run out of Hydrocodone in 2 days

## 2023-05-07 NOTE — Telephone Encounter (Signed)
PMP was Reviewed.  Bianca Goodman called office reporting she was ill, her appointment was rescheduled.  Hydrocodone e-scribed to pharmacy.  My- Chart message sent to Bianca Goodman.

## 2023-05-08 ENCOUNTER — Encounter: Payer: Medicaid Other | Admitting: Registered Nurse

## 2023-05-30 ENCOUNTER — Other Ambulatory Visit: Payer: Self-pay

## 2023-05-30 DIAGNOSIS — I10 Essential (primary) hypertension: Secondary | ICD-10-CM

## 2023-05-30 DIAGNOSIS — E039 Hypothyroidism, unspecified: Secondary | ICD-10-CM

## 2023-05-30 MED ORDER — LOSARTAN POTASSIUM 50 MG PO TABS
50.0000 mg | ORAL_TABLET | Freq: Every day | ORAL | 1 refills | Status: DC
Start: 2023-05-30 — End: 2023-10-10

## 2023-05-30 MED ORDER — LEVOTHYROXINE SODIUM 88 MCG PO TABS: 88.0000 ug | ORAL_TABLET | Freq: Every day | ORAL | 1 refills | Status: DC

## 2023-05-30 NOTE — Telephone Encounter (Signed)
RX sent to pharmacy and patient notified VIA phone. Dm/cma  

## 2023-05-30 NOTE — Telephone Encounter (Signed)
 Copied from CRM [id number]. Topic: Clinical - Medication Refill >> May 30, 2023 11:17 AM Rosina BIRCH wrote: Most Recent Primary Care Visit:  Provider: THEDORA GARNETTE HERO  Department: [facility]  Visit Type: OFFICE VISIT  Date: 11/13/2022  Medication: losartan , thyroid   Has the patient contacted their pharmacy? No (Agent: If no, request that the patient contact the pharmacy for the refill. If patient does not wish to contact the pharmacy document the reason why and proceed with request.) (Agent: If yes, when and what did the pharmacy advise?)  Is this the correct pharmacy for this prescription? Yes If no, delete pharmacy and type the correct one.  This is the patient's preferred pharmacy:  [facility] [address] [place], [place] - [address] [place] [address] Phone: [phone] Fax: [phone]    Has the prescription been filled recently? No  Is the patient out of the medication? Yes  Has the patient been seen for an appointment in the last year OR does the patient have an upcoming appointment? Yes  Can we respond through MyChart? No  Agent: Please be advised that Rx refills may take up to 3 business days. We ask that you follow-up with your pharmacy.

## 2023-06-04 NOTE — Progress Notes (Signed)
 Subjective:    Patient ID: Bianca Goodman, female    DOB: March 26, 1961, 63 y.o.   MRN: [id number]  HPI: Bianca Goodman is a 63 y.o. female who returns for follow up appointment for chronic pain and medication refill. She states her pain is located in her neck radiating into her left shoulder, mid-lower back pain radiating into her left groin and left hip. She reports she is only receiving 4 hours of relief with her current medication regimen. She rates her pain 9. Her current exercise regime is walking short distances in her home, she was encouraged to increase HEP as tolerated. She verbalizes understanding.   Bianca Goodman Morphine equivalent is 40.00 MME. Oral Swab was Performed today.        Pain Inventory Average Pain 8 Pain Right Now 9 My pain is constant, sharp, burning, dull, stabbing, tingling, and aching  In the last 24 hours, has pain interfered with the following? General activity 10 Relation with others 10 Enjoyment of life 10 What TIME of day is your pain at its worst? morning , daytime, evening, and night Sleep (in general) Poor  Pain is worse with: walking, bending, sitting, standing, and some activites Pain improves with: rest, medication, and heat, exercise Relief from Meds: 5  Family History  Problem Relation Age of Onset   Cancer Mother    Heart disease Father    Heart disease Brother    Heart attack Brother        stent placement   Social History   Socioeconomic History   Marital status: Widowed    Spouse name: Not on file   Number of children: 1   Years of education: Not on file   Highest education level: Not on file  Occupational History   Not on file  Tobacco Use   Smoking status: Former    Current packs/day: 0.00    Types: Cigarettes    Quit date: 09/20/2019    Years since quitting: 3.7   Smokeless tobacco: Never  Vaping Use   Vaping status: Never Used  Substance and Sexual Activity   Alcohol use: Not Currently   Drug use: Never   Sexual activity:  Yes  Other Topics Concern   Not on file  Social History Narrative   Caffeine one cup daily, coffee with milk.   Lives with son.  Widow.  Education HS.     Social Drivers of Health   Financial Resource Strain: Medium Risk (09/25/2022)   Overall Financial Resource Strain (CARDIA)    Difficulty of Paying Living Expenses: Somewhat hard  Food Insecurity: No Food Insecurity (09/25/2022)   Hunger Vital Sign    Worried About Running Out of Food in the Last Year: Never true    Ran Out of Food in the Last Year: Never true  Transportation Needs: No Transportation Needs (09/15/2022)   PRAPARE - Administrator, Civil Service (Medical): No    Lack of Transportation (Non-Medical): No  Physical Activity: Not on file  Stress: Stress Concern Present (09/20/2022)   [employer] of Occupational Health - Occupational Stress Questionnaire    Feeling of Stress : To some extent  Social Connections: Not on file   Past Surgical History:  Procedure Laterality Date   APPENDECTOMY     1997   CERVICAL SPINE SURGERY     C4-5 spinal fusion   RIGHT OOPHORECTOMY     scalenectomy     1985   Past Surgical History:  Procedure Laterality Date  APPENDECTOMY     1997   CERVICAL SPINE SURGERY     C4-5 spinal fusion   RIGHT OOPHORECTOMY     scalenectomy     1985   Past Medical History:  Diagnosis Date   Endometriosis    Hypertension    Migraines    Parkinson's disease (HCC)    Thyroid  disease    There were no vitals taken for this visit.  Opioid Risk Score:   Fall Risk Score:  `1  Depression screen [facility] 2/9     01/29/2023   12:57 PM 11/01/2022   10:03 AM 09/15/2022   11:13 AM 09/06/2022   10:18 AM 08/02/2022    9:59 AM 06/15/2022    9:23 AM 05/05/2022    9:53 AM  Depression screen PHQ 2/9  Decreased Interest 3 3 1 1 1 1  0  Down, Depressed, Hopeless 2 2 1 1  1  0  PHQ - 2 Score 5 5 2 2 1 2  0  Altered sleeping  3 1  1     Tired, decreased energy  3 3      Change in appetite  1 0       Feeling bad or failure about yourself   2 0      Trouble concentrating  0 0      Moving slowly or fidgety/restless  2 0      Suicidal thoughts   0      PHQ-9 Score  16 6  2     Difficult doing work/chores   Somewhat difficult        Review of Systems  Musculoskeletal:  Positive for back pain.       Pain in left fingers, left neck & back  All other systems reviewed and are negative.     Objective:   Physical Exam Vitals and nursing note reviewed.  Constitutional:      Appearance: Normal appearance.  Neck:     Comments: Cervical paraspinal Tenderness: C-4-C-6 Mainly Left Side Cardiovascular:     Rate and Rhythm: Normal rate and regular rhythm.     Pulses: Normal pulses.     Heart sounds: Normal heart sounds.  Pulmonary:     Effort: Pulmonary effort is normal.     Breath sounds: Normal breath sounds. No stridor.  Musculoskeletal:     Comments: Normal Muscle Bulk and Muscle Testing Reveals:  Upper Extremities: Right: Upper Extremity: Full ROM and Muscle Strength 5/5 Left Upper Extremity: Decreased ROM 45 Degrees and Muscle Strength 3/5 Left AC Joint Tenderness Thoracic Paraspinal Tenderness: T-7-T-10 Lumbar Paraspinal Tenderness: L-3-L-5 Left Greater Trochanter tenderness Lower Extremities: Right: Full ROM and Muscle Strength 5/5 Left Lower Extremity: Decreased ROM and Muscle Strength 5/5 Left Lower Extremity Flexion Produces Pain into Her Left Hip Arises from Table slowly Antalgic  Gait     Skin:    General: Skin is warm and dry.  Neurological:     Mental Status: She is alert and oriented to person, place, and time.  Psychiatric:        Mood and Affect: Mood normal.        Behavior: Behavior normal.         Assessment & Plan:  1.Cervicalgia/ Cervical Radiculitis:RX: Cervical X-ray: Bianca Goodman was encouraged again to have X-ray done, she verbalizes understanding. Continue HEP as Tolerated. Continue Pregabalin  .Continue to monitor. 06/05/2023 Left Arm Radicular Pain:  Continue Pregabalin . Continue to Monitor.06/05/2023 2. Chronic Pain Syndrome: Increased: Hydrocodone  10 mg/325 one tablet 5  times a day as needed for pain #150. We will continue the opioid monitoring program, this consists of regular clinic visits, examinations, urine drug screen, pill counts as well as use of [place]  Controlled Substance Reporting system. A 12 month History has been reviewed on the [place]  Controlled Substance Reporting System Today.   Continue current medication regimen. Continue to monitor. 06/05/2023 3. Left Hand Pain: Ortho Following. Continue to monitor. 06/05/2023 4. Chronic Thoracic Pain: Continue current medication regimen. Continue to monitor. 06/05/2023 5. Left Hip Pain/ Left  Greater Trochanter Bursitis: RX: Left Hip X-ray.  Continue alternate Ice and Heat Therapy . Continue to monitor. 06/05/2023 6. Left Hand Tremor: Neurology Following. Continue to monitor. 06/05/2023  7. Acute Exacerbation of Chronic Low Back Pain: She denies falling. RX: Lumbar X-ray. Continue to Monitor.    F/U in 1 month

## 2023-06-05 ENCOUNTER — Encounter: Payer: Medicaid Other | Attending: Registered Nurse | Admitting: Registered Nurse

## 2023-06-05 ENCOUNTER — Encounter: Payer: Self-pay | Admitting: Registered Nurse

## 2023-06-05 VITALS — BP 117/80 | HR 88 | Ht 63.0 in | Wt 170.0 lb

## 2023-06-05 DIAGNOSIS — M25552 Pain in left hip: Secondary | ICD-10-CM | POA: Insufficient documentation

## 2023-06-05 DIAGNOSIS — M542 Cervicalgia: Secondary | ICD-10-CM | POA: Diagnosis present

## 2023-06-05 DIAGNOSIS — M7062 Trochanteric bursitis, left hip: Secondary | ICD-10-CM | POA: Insufficient documentation

## 2023-06-05 DIAGNOSIS — M5416 Radiculopathy, lumbar region: Secondary | ICD-10-CM | POA: Insufficient documentation

## 2023-06-05 DIAGNOSIS — Z5181 Encounter for therapeutic drug level monitoring: Secondary | ICD-10-CM | POA: Insufficient documentation

## 2023-06-05 DIAGNOSIS — G894 Chronic pain syndrome: Secondary | ICD-10-CM | POA: Insufficient documentation

## 2023-06-05 DIAGNOSIS — Z79891 Long term (current) use of opiate analgesic: Secondary | ICD-10-CM | POA: Insufficient documentation

## 2023-06-05 DIAGNOSIS — M25512 Pain in left shoulder: Secondary | ICD-10-CM | POA: Insufficient documentation

## 2023-06-05 DIAGNOSIS — M5412 Radiculopathy, cervical region: Secondary | ICD-10-CM | POA: Insufficient documentation

## 2023-06-05 DIAGNOSIS — M545 Low back pain, unspecified: Secondary | ICD-10-CM | POA: Diagnosis present

## 2023-06-05 DIAGNOSIS — G8929 Other chronic pain: Secondary | ICD-10-CM | POA: Diagnosis present

## 2023-06-05 MED ORDER — HYDROCODONE-ACETAMINOPHEN 10-325 MG PO TABS: 1.0000 | ORAL_TABLET | Freq: Every day | ORAL | 0 refills | Status: DC | PRN

## 2023-06-08 LAB — DRUG TOX MONITOR 1 W/CONF, ORAL FLD

## 2023-06-08 LAB — DRUG TOX ALC METAB W/CON, ORAL FLD: Alcohol Metabolite: NEGATIVE ng/mL (ref <25)

## 2023-06-21 ENCOUNTER — Ambulatory Visit (HOSPITAL_BASED_OUTPATIENT_CLINIC_OR_DEPARTMENT_OTHER)
Admission: RE | Admit: 2023-06-21 | Discharge: 2023-06-21 | Disposition: A | Discharge status: Home / Self Care | Payer: Medicaid Other | Source: Ambulatory Visit | Attending: Registered Nurse | Admitting: Registered Nurse

## 2023-06-21 DIAGNOSIS — M542 Cervicalgia: Secondary | ICD-10-CM | POA: Diagnosis present

## 2023-06-21 DIAGNOSIS — M5412 Radiculopathy, cervical region: Secondary | ICD-10-CM | POA: Insufficient documentation

## 2023-06-21 DIAGNOSIS — M25552 Pain in left hip: Secondary | ICD-10-CM | POA: Diagnosis present

## 2023-06-21 DIAGNOSIS — G8929 Other chronic pain: Secondary | ICD-10-CM | POA: Diagnosis present

## 2023-06-21 DIAGNOSIS — M545 Low back pain, unspecified: Secondary | ICD-10-CM | POA: Diagnosis present

## 2023-06-26 ENCOUNTER — Encounter: Payer: Self-pay | Admitting: Family Medicine

## 2023-06-26 ENCOUNTER — Ambulatory Visit: Payer: Medicaid Other | Admitting: Family Medicine

## 2023-06-26 VITALS — BP 130/82 | HR 94 | Temp 98.9°F | Ht 63.0 in | Wt 168.4 lb

## 2023-06-26 DIAGNOSIS — I10 Essential (primary) hypertension: Secondary | ICD-10-CM

## 2023-06-26 DIAGNOSIS — E559 Vitamin D deficiency, unspecified: Secondary | ICD-10-CM

## 2023-06-26 DIAGNOSIS — E538 Deficiency of other specified B group vitamins: Secondary | ICD-10-CM

## 2023-06-26 DIAGNOSIS — G20A2 Parkinson's disease without dyskinesia, with fluctuations: Secondary | ICD-10-CM

## 2023-06-26 DIAGNOSIS — Z1211 Encounter for screening for malignant neoplasm of colon: Secondary | ICD-10-CM

## 2023-06-26 DIAGNOSIS — E039 Hypothyroidism, unspecified: Secondary | ICD-10-CM

## 2023-06-26 DIAGNOSIS — R7303 Prediabetes: Secondary | ICD-10-CM | POA: Diagnosis not present

## 2023-06-26 DIAGNOSIS — Z1231 Encounter for screening mammogram for malignant neoplasm of breast: Secondary | ICD-10-CM

## 2023-06-26 LAB — VITAMIN D 25 HYDROXY (VIT D DEFICIENCY, FRACTURES): VITD: 36.78 ng/mL (ref 30.00–100.00)

## 2023-06-26 LAB — BASIC METABOLIC PANEL
BUN: 13 mg/dL (ref 6–23)
CO2: 25 meq/L (ref 19–32)
Calcium: 9.1 mg/dL (ref 8.4–10.5)
Chloride: 107 meq/L (ref 96–112)
Creatinine, Ser: 0.8 mg/dL (ref 0.40–1.20)
GFR: 80.1 mL/min (ref >60.00)
Glucose, Bld: 101 mg/dL — ABNORMAL HIGH (ref 70–99)
Potassium: 4.1 meq/L (ref 3.5–5.1)
Sodium: 142 meq/L (ref 135–145)

## 2023-06-26 LAB — VITAMIN B12: Vitamin B-12: 107 pg/mL — ABNORMAL LOW (ref 211–911)

## 2023-06-26 LAB — HEMOGLOBIN A1C: Hgb A1c MFr Bld: 6.3 % (ref 4.6–6.5)

## 2023-06-26 LAB — TSH: TSH: 0.89 u[IU]/mL (ref 0.35–5.50)

## 2023-06-26 NOTE — Assessment & Plan Note (Signed)
Tremor improved on pramipexole 1 mg TID and Sinemet 25-100 mg QID. She will continue to follow with neurology.

## 2023-06-26 NOTE — Assessment & Plan Note (Signed)
TSH had been at goal. I will reassess this today. Continue levothyroxine 88 mcg daily.

## 2023-06-26 NOTE — Progress Notes (Signed)
 [facility] [address] [place] [address] Dept: [phone] Dept Fax: [phone]  Chronic Care Office Visit  Subjective:    Patient ID: Bianca Goodman, female    DOB: [birth year], 63 y.o..   MRN: [id number]  Chief Complaint  Patient presents with   Hypertension    3 month f/u HTN.  C/o having hand/arm getting real cold off/on and pain in neck x 2 months.     Medical Interpreter: Leita  History of Present Illness:  Patient is in today for reassessment of chronic medical issues.  Ms. Schnapp has a history of Parkinson's disease. She is managed by Dr. Buck (neurology). She is currently managed on pramipexole  1 mg TID and Sinemet  25-100 mg QID. She continues to note tremor, though she feels this internally more than she exhibits this externally. She still notes episodes of bradykinesia and postural instability. She still worries about this, but her son is with her now and has been a good support for her.   Ms. Vahey continues to work with [facility]) for her chronic pain management. She is managed on pregabalin  and Norco.    Ms. Geraci has a history of hypertension. She is managed on losartan  50 mg daily, verapamil  120 mg bid, and HCTZ 12.5 mg daily. She notes she did not have time to take her medicine this morning prior to coming for her appointment.   Ms. Hosking has a history of hypothyroidism. She is managed on levothyroxine  88 mcg daily.   Ms. Nebergall has a history of prediabetes. She has had 2 HbA1c of 6.5% but with normal fasting glucose levels and no overt signs of diabetes.  Ms. Delancey has had both a low Vitamin D  and Vitamin B12 level. She is on oral supplements of both.  Past Medical History: Patient Active Problem List   Diagnosis Date Noted   Asthma, mild intermittent 11/13/2022   Vitamin D  deficiency 09/14/2022   Vitamin B12 deficiency 09/14/2022   Parkinson disease  (HCC) 05/03/2021   Depression, major, single episode, moderate (HCC) 05/03/2021   Atypical squamous cell changes of undetermined significance (ASCUS) on cervical cytology with negative high risk human papilloma virus (HPV) test result 11/09/2020   Essential hypertension 10/28/2020   Hypothyroidism 10/28/2020   History of endometriosis 10/28/2020   Prediabetes 10/28/2020   Radicular pain in left arm 09/14/2020   Past Surgical History:  Procedure Laterality Date   APPENDECTOMY     1997   CERVICAL SPINE SURGERY     C4-5 spinal fusion   RIGHT OOPHORECTOMY     scalenectomy     1985   Family History  Problem Relation Age of Onset   Cancer Mother    Heart disease Father    Heart disease Brother    Heart attack Brother        stent placement   Outpatient Medications Prior to Visit  Medication Sig Dispense Refill   albuterol  (VENTOLIN  HFA) 108 (90 Base) MCG/ACT inhaler Inhale 2 puffs into the lungs every 6 (six) hours as needed for wheezing or shortness of breath. 6.7 g 2   carbidopa -levodopa  (SINEMET  IR) 25-100 MG tablet Take 1 tablet by mouth 4 (four) times daily. 8:30 AM, 12:30 PM, 4:30 PM, and 8:30 PM daily 360 tablet 3   cyanocobalamin  (VITAMIN B12) 1000 MCG tablet Take 1 tablet (1,000 mcg total) by mouth daily. 90 tablet 3   HYDROcodone -acetaminophen  (NORCO) 10-325 MG tablet Take 1 tablet by  mouth 5 (five) times daily as needed. Sig was changed today, please fill today. 150 tablet 0   hydroxypropyl methylcellulose / hypromellose (ISOPTO TEARS / GONIOVISC) 2.5 % ophthalmic solution Place 1 drop into both eyes 3 (three) times daily as needed for dry eyes. 15 mL 12   levothyroxine  (SYNTHROID ) 88 MCG tablet Take 1 tablet (88 mcg total) by mouth daily. 90 tablet 1   losartan  (COZAAR ) 50 MG tablet Take 1 tablet (50 mg total) by mouth daily. 90 tablet 1   Polyvinyl Alcohol-Povidone (ARTIFICIAL TEARS) 5-6 MG/ML SOLN Place 1 drop into both eyes 3 (three) times daily as needed. 30 mL 5    pramipexole  (MIRAPEX ) 1 MG tablet Take 1 tablet (1 mg total) by mouth 3 (three) times daily. 270 tablet 3   pregabalin  (LYRICA ) 25 MG capsule Take 1 capsule (25 mg total) by mouth at bedtime. 30 capsule 3   verapamil  (CALAN ) 120 MG tablet Take 1 tablet (120 mg total) by mouth 2 (two) times daily. 180 tablet 3   Vitamin D , Ergocalciferol , (DRISDOL ) 1.25 MG (50000 UNIT) CAPS capsule Take 1 capsule (50,000 Units total) by mouth every 7 (seven) days. 12 capsule 1   No facility-administered medications prior to visit.   Allergies  Allergen Reactions   Cortizone-10 [Hydrocortisone] Rash   Tramadol  Nausea Only   Objective:   Today's Vitals   06/26/23 0906  BP: 130/82  Pulse: 94  Temp: 98.9 F (37.2 C)  TempSrc: Temporal  SpO2: 100%  Weight: 168 lb 6.4 oz (76.4 kg)  Height: 5' 3 (1.6 m)   Body mass index is 29.83 kg/m.   General: Well developed, well nourished. No acute distress. Neuro: Mild, fine tremor noted. Psych: Alert and oriented. Normal mood and affect.  Health Maintenance Due  Topic Date Due   Pneumococcal Vaccine 41-20 Years old (1 of 2 - PCV) Never done   Zoster Vaccines- Shingrix (1 of 2) Never done   Colonoscopy  Never done   MAMMOGRAM  Never done     Assessment & Plan:   Problem List Items Addressed This Visit       Cardiovascular and Mediastinum   Essential hypertension - Primary   Blood pressure is in adequate control, esp. as she did not take her medicine today. Continue losartan  50 mg daily, verapamil  120 mg bid,a nd HCTZ 12.5 mg daily.      Relevant Orders   Basic metabolic panel     Endocrine   Hypothyroidism   TSH had been at goal. I will reassess this today. Continue levothyroxine  88 mcg daily.      Relevant Orders   TSH     Nervous and Auditory   Parkinson disease (HCC)   Tremor improved on pramipexole  1 mg TID and Sinemet  25-100 mg QID. She will continue to follow with neurology.        Other   Prediabetes   I will repeat her A1c  and blood sugar today.      Relevant Orders   Hemoglobin A1c   Basic metabolic panel   Vitamin B12 deficiency   I will reassess her Vitamin B12 level today.      Relevant Orders   Vitamin B12   Vitamin D  deficiency   I will reassess her Vitamin D  level today.      Relevant Orders   VITAMIN D  25 Hydroxy (Vit-D Deficiency, Fractures)   Other Visit Diagnoses       Encounter for screening mammogram for malignant neoplasm  of breast       I will reorder this and see if we can have her come to our mobile unit in March.   Relevant Orders   MM DIGITAL SCREENING BILATERAL     Screening for colon cancer       Patient has Cologuard at home. I recommended she call the company to discuss her options for returning this once she collects it.       Return in about 3 months (around 09/23/2023) for Reassessment.   Garnette CHRISTELLA Simpler, MD

## 2023-06-26 NOTE — Assessment & Plan Note (Signed)
I will repeat her A1c and blood sugar today.

## 2023-06-26 NOTE — Assessment & Plan Note (Signed)
I will reassess her Vitamin B12 level today.

## 2023-06-26 NOTE — Assessment & Plan Note (Signed)
Blood pressure is in adequate control, esp. as she did not take her medicine today. Continue losartan 50 mg daily, verapamil 120 mg bid,a nd HCTZ 12.5 mg daily.

## 2023-06-26 NOTE — Assessment & Plan Note (Signed)
 I will reassess her Vitamin D level today.

## 2023-06-27 ENCOUNTER — Telehealth: Payer: Self-pay | Admitting: Registered Nurse

## 2023-06-27 MED ORDER — CYANOCOBALAMIN 1000 MCG/ML IJ SOLN: INTRAMUSCULAR | 0 refills | Status: DC

## 2023-06-27 MED ORDER — HYDROCODONE-ACETAMINOPHEN 10-325 MG PO TABS: 1.0000 | ORAL_TABLET | Freq: Every day | ORAL | 0 refills | Status: DC | PRN

## 2023-06-27 NOTE — Addendum Note (Signed)
 Addended by: Graig Lawyer on: 06/27/2023 11:41 AM   Modules accepted: Orders

## 2023-06-27 NOTE — Telephone Encounter (Signed)
 PMP was Reviewed Hydrocodone  e-scribed to pharmacy.  Call placed to [name],  awaiting  X-ray- results . She verbalizes understanding.

## 2023-06-27 NOTE — Addendum Note (Signed)
 Addended by: Graig Lawyer on: 06/27/2023 08:27 AM   Modules accepted: Orders

## 2023-06-27 NOTE — Telephone Encounter (Signed)
 Patient called in to reschedule her appointment since she is not feeling well , rescheduled per patient request to 2/21 , patient states she will run out of medications 2/12 and would like to request refills on Hydrocodone  to last until her appointment .   Patient states she already went to complete her xray and is requesting results

## 2023-06-27 NOTE — Addendum Note (Signed)
 Addended by: Jodi Munroe on: 06/27/2023 04:33 PM   Modules accepted: Orders

## 2023-06-28 ENCOUNTER — Encounter: Payer: Medicaid Other | Admitting: Registered Nurse

## 2023-07-02 ENCOUNTER — Telehealth: Payer: Self-pay | Admitting: Registered Nurse

## 2023-07-02 NOTE — Telephone Encounter (Signed)
 Received script for hydrocodone . Per pharmacy more medical information is required ie: DX code. Possible care plan, monitoring, drug screen information might be needed.  Please call pharmacy [facility] [phone]

## 2023-07-03 ENCOUNTER — Telehealth: Payer: Self-pay | Admitting: Registered Nurse

## 2023-07-03 ENCOUNTER — Ambulatory Visit: Payer: Medicaid Other

## 2023-07-03 NOTE — Telephone Encounter (Signed)
Return Bianca Goodman Call, she states the pharmacy wants this provider to give them a call. Call placed to [facility], spoke with pharmacist, she asked about our office stating it's a new protocol. All questions answered. Call placed to Ms. Wilcoxen regarding the [employer].

## 2023-07-12 NOTE — Progress Notes (Unsigned)
 Subjective:    Patient ID: Bianca Goodman, female    DOB: [birth year], 63 y.o.   MRN: [id number]  HPI: Bianca Goodman is a 63 y.o. female who returns for follow up appointment for chronic pain and medication refill. She states her pain is located in her neck radiating into her left shoulder, left arm, left elbow, left pinky and left ring finger. Bianca Goodman report increase intensity and frequency of neck pain and decreased ROM. Cervical X-ray was reviewed and discussed with Bianca Goodman, will speak with Dr Wynn Banker regarding MRI. She also reports lower back pain radiating into her left hip .She  rates her pain 9. Her current exercise regime is walking and performing stretching exercises.  Bianca Goodman arrived tachycardic apical pulse checked, 100.   Bianca Goodman Morphine equivalent is 50.00 MME.   Last Oral Swab was Performed 06/05/2023, no medications noted. Bianca Goodman states she is compliant with her medication.     Pain Inventory Average Pain 9 Pain Right Now 9 My pain is constant, sharp, burning, dull, stabbing, tingling, and aching  In the last 24 hours, has pain interfered with the following? General activity 10 Relation with others 10 Enjoyment of life 10 What TIME of day is your pain at its worst? morning , daytime, evening, and night Sleep (in general) Poor  Pain is worse with: walking, bending, sitting, standing, and some activites Pain improves with: rest, heat/ice, and medication Relief from Meds: 5  Family History  Problem Relation Age of Onset   Cancer Mother    Heart disease Father    Heart disease Brother    Heart attack Brother        stent placement   Social History   Socioeconomic History   Marital status: Widowed    Spouse name: Not on file   Number of children: 1   Years of education: Not on file   Highest education level: Not on file  Occupational History   Not on file  Tobacco Use   Smoking status: Former    Current packs/day: 0.00    Types: Cigarettes    Quit  date: 09/20/2019    Years since quitting: 3.8   Smokeless tobacco: Never  Vaping Use   Vaping status: Never Used  Substance and Sexual Activity   Alcohol use: Not Currently   Drug use: Never   Sexual activity: Yes  Other Topics Concern   Not on file  Social History Narrative   Caffeine one cup daily, coffee with milk.   Lives with son.  Widow.  Education HS.     Social Drivers of Health   Financial Resource Strain: Medium Risk (09/25/2022)   Overall Financial Resource Strain (CARDIA)    Difficulty of Paying Living Expenses: Somewhat hard  Food Insecurity: No Food Insecurity (09/25/2022)   Hunger Vital Sign    Worried About Running Out of Food in the Last Year: Never true    Ran Out of Food in the Last Year: Never true  Transportation Needs: No Transportation Needs (09/15/2022)   PRAPARE - Administrator, Civil Service (Medical): No    Lack of Transportation (Non-Medical): No  Physical Activity: Not on file  Stress: Stress Concern Present (09/20/2022)   [employer] of Occupational Health - Occupational Stress Questionnaire    Feeling of Stress : To some extent  Social Connections: Not on file   Past Surgical History:  Procedure Laterality Date   APPENDECTOMY     1997  CERVICAL SPINE SURGERY     C4-5 spinal fusion   RIGHT OOPHORECTOMY     scalenectomy     1985   Past Surgical History:  Procedure Laterality Date   APPENDECTOMY     1997   CERVICAL SPINE SURGERY     C4-5 spinal fusion   RIGHT OOPHORECTOMY     scalenectomy     1985   Past Medical History:  Diagnosis Date   Endometriosis    Hypertension    Migraines    Parkinson's disease (HCC)    Thyroid disease    BP 108/60   Pulse (!) 107   Ht 5\' 3"  (1.6 m)   Wt 168 lb 12.8 oz (76.6 kg)   SpO2 98%   BMI 29.90 kg/m   Opioid Risk Score:   Fall Risk Score:  `1  Depression screen [facility] 2/9     07/13/2023    9:32 AM 06/05/2023    9:07 AM 01/29/2023   12:57 PM 11/01/2022   10:03 AM 09/15/2022    11:13 AM 09/06/2022   10:18 AM 08/02/2022    9:59 AM  Depression screen PHQ 2/9  Decreased Interest 3 1 3 3 1 1 1   Down, Depressed, Hopeless 3 1 2 2 1 1    PHQ - 2 Score 6 2 5 5 2 2 1   Altered sleeping    3 1  1   Tired, decreased energy    3 3    Change in appetite    1 0    Feeling bad or failure about yourself     2 0    Trouble concentrating    0 0    Moving slowly or fidgety/restless    2 0    Suicidal thoughts     0    PHQ-9 Score    16 6  2   Difficult doing work/chores     Somewhat difficult      Review of Systems  Musculoskeletal:  Positive for neck pain.       Left side, fell on hip  Psychiatric/Behavioral:  Positive for dysphoric mood.   All other systems reviewed and are negative.      Objective:   Physical Exam Vitals and nursing note reviewed.  Constitutional:      Appearance: Normal appearance.  Neck:     Comments: Cervical Paraspinal Tenderness: C-5-C-6 Decreased ROM with Right/ Left Rotation and Cervical Extension and Flexion.  Neurological:     Mental Status: She is alert.           Assessment & Plan:  1.Cervicalgia/ Cervical Radiculitis:Continue HEP as Tolerated. Continue Pregabalin .Continue to monitor. 07/13/2023 Left Arm Radicular Pain: Continue Pregabalin. Continue to Monitor.07/13/2023 2. Chronic Pain Syndrome: Continue Hydrocodone 10 mg/325 one tablet 4  times a day as needed for pain #120.Refill sent to accommodate scheduled appointment.  We will continue the opioid monitoring program, this consists of regular clinic visits, examinations, urine drug screen, pill counts as well as use of [place] Controlled Substance Reporting system. A 12 month History has been reviewed on the [place] Controlled Substance Reporting System Today.   Continue current medication regimen. Continue to monitor. 07/13/2023 3. Left Hand Pain: Ortho Following. Continue to monitor. 07/13/2023 4. Chronic Thoracic Pain: No complaints today. Continue current  medication regimen. Continue to monitor. 07/13/2023 5. Bilateral Greater Trochanter Bursitis: No complaints today.  Continue alternate Ice and Heat Therapy . Continue to monitor. 07/13/2023 6. Left Hand Tremor: Neurology Following. Continue to  monitor. 07/13/2023    F/U in 1 month

## 2023-07-13 ENCOUNTER — Encounter: Payer: Self-pay | Admitting: Registered Nurse

## 2023-07-13 ENCOUNTER — Encounter: Payer: Medicaid Other | Attending: Registered Nurse | Admitting: Registered Nurse

## 2023-07-13 VITALS — BP 108/60 | HR 107 | Ht 63.0 in | Wt 168.8 lb

## 2023-07-13 DIAGNOSIS — M542 Cervicalgia: Secondary | ICD-10-CM | POA: Diagnosis not present

## 2023-07-13 DIAGNOSIS — M5412 Radiculopathy, cervical region: Secondary | ICD-10-CM | POA: Diagnosis present

## 2023-07-13 DIAGNOSIS — Z79891 Long term (current) use of opiate analgesic: Secondary | ICD-10-CM | POA: Insufficient documentation

## 2023-07-13 DIAGNOSIS — M5416 Radiculopathy, lumbar region: Secondary | ICD-10-CM | POA: Insufficient documentation

## 2023-07-13 DIAGNOSIS — G894 Chronic pain syndrome: Secondary | ICD-10-CM | POA: Diagnosis not present

## 2023-07-13 DIAGNOSIS — Z5181 Encounter for therapeutic drug level monitoring: Secondary | ICD-10-CM | POA: Diagnosis present

## 2023-07-13 MED ORDER — PREGABALIN 25 MG PO CAPS: 25.0000 mg | ORAL_CAPSULE | Freq: Every day | ORAL | 3 refills | Status: DC

## 2023-07-13 MED ORDER — HYDROCODONE-ACETAMINOPHEN 10-325 MG PO TABS: 1.0000 | ORAL_TABLET | Freq: Every day | ORAL | 0 refills | Status: DC | PRN

## 2023-07-30 ENCOUNTER — Telehealth: Payer: Self-pay | Admitting: Registered Nurse

## 2023-07-30 MED ORDER — HYDROCODONE-ACETAMINOPHEN 10-325 MG PO TABS
1.0000 | ORAL_TABLET | Freq: Every day | ORAL | 0 refills | Status: DC | PRN
Start: 2023-07-30 — End: 2023-08-29

## 2023-07-30 NOTE — Telephone Encounter (Signed)
 PMP was Reviewed.  Last Hydrocodone was filled on 07/04/2023.  Call placed to [facility], for clarification , last prescription had do not fill date 08/07/2023. New prescription was sent to [facility].  Call placed to Bianca Goodman regarding the above, she verbalizes understanding.

## 2023-07-30 NOTE — Telephone Encounter (Signed)
 Patient called in to requesting Hydrocodone to be refilled by 3/12, pharmacy informed patient she should not received medication until 3/18 but states she will be out of medication by 3/12

## 2023-08-06 ENCOUNTER — Inpatient Hospital Stay: Admission: RE | Admit: 2023-08-06 | Payer: Medicaid Other | Source: Ambulatory Visit

## 2023-08-28 ENCOUNTER — Other Ambulatory Visit: Payer: Self-pay | Admitting: Family Medicine

## 2023-08-28 DIAGNOSIS — E538 Deficiency of other specified B group vitamins: Secondary | ICD-10-CM

## 2023-08-29 ENCOUNTER — Encounter: Payer: Medicaid Other | Attending: Registered Nurse | Admitting: Registered Nurse

## 2023-08-29 ENCOUNTER — Encounter: Payer: Self-pay | Admitting: Registered Nurse

## 2023-08-29 VITALS — BP 118/79 | HR 95 | Ht 63.0 in | Wt 167.4 lb

## 2023-08-29 DIAGNOSIS — Z5181 Encounter for therapeutic drug level monitoring: Secondary | ICD-10-CM | POA: Diagnosis not present

## 2023-08-29 DIAGNOSIS — G8929 Other chronic pain: Secondary | ICD-10-CM

## 2023-08-29 DIAGNOSIS — Z79891 Long term (current) use of opiate analgesic: Secondary | ICD-10-CM

## 2023-08-29 DIAGNOSIS — M5412 Radiculopathy, cervical region: Secondary | ICD-10-CM | POA: Diagnosis not present

## 2023-08-29 DIAGNOSIS — M7062 Trochanteric bursitis, left hip: Secondary | ICD-10-CM | POA: Diagnosis not present

## 2023-08-29 DIAGNOSIS — W19XXXD Unspecified fall, subsequent encounter: Secondary | ICD-10-CM | POA: Insufficient documentation

## 2023-08-29 DIAGNOSIS — M7061 Trochanteric bursitis, right hip: Secondary | ICD-10-CM | POA: Insufficient documentation

## 2023-08-29 DIAGNOSIS — M25561 Pain in right knee: Secondary | ICD-10-CM

## 2023-08-29 DIAGNOSIS — M25512 Pain in left shoulder: Secondary | ICD-10-CM | POA: Diagnosis not present

## 2023-08-29 DIAGNOSIS — M51372 Other intervertebral disc degeneration, lumbosacral region with discogenic back pain and lower extremity pain: Secondary | ICD-10-CM | POA: Diagnosis not present

## 2023-08-29 DIAGNOSIS — M25562 Pain in left knee: Secondary | ICD-10-CM | POA: Insufficient documentation

## 2023-08-29 DIAGNOSIS — M545 Low back pain, unspecified: Secondary | ICD-10-CM | POA: Diagnosis not present

## 2023-08-29 DIAGNOSIS — M79642 Pain in left hand: Secondary | ICD-10-CM | POA: Diagnosis not present

## 2023-08-29 DIAGNOSIS — M5416 Radiculopathy, lumbar region: Secondary | ICD-10-CM | POA: Diagnosis not present

## 2023-08-29 DIAGNOSIS — Y92009 Unspecified place in unspecified non-institutional (private) residence as the place of occurrence of the external cause: Secondary | ICD-10-CM | POA: Diagnosis not present

## 2023-08-29 DIAGNOSIS — M542 Cervicalgia: Secondary | ICD-10-CM

## 2023-08-29 DIAGNOSIS — G894 Chronic pain syndrome: Secondary | ICD-10-CM

## 2023-08-29 MED ORDER — HYDROCODONE-ACETAMINOPHEN 10-325 MG PO TABS: 1.0000 | ORAL_TABLET | Freq: Every day | ORAL | 0 refills | Status: DC | PRN

## 2023-08-29 NOTE — Progress Notes (Signed)
 Subjective:    Patient ID: Bianca Goodman, female    DOB: 07-30-1960, 63 y.o.   MRN: [id number]  HPI: Bianca Goodman is a 63 y.o. female who returns for follow up appointment for chronic pain and medication refill. She states her pain is located in her neck radiating into her left shoulder, lower back pain radiating into her left lower extremity and bilateral knee pain. She also reports increase intensity of cervical radicular pain,  cervical X-ray was reviewed, This provider will discuss with Dr Wynn Banker regarding a MRI, she verbalizes understanding. She  rates her pain 8. Her current exercise regime is walking and performing stretching exercises.  [name] states two weeks ago, she was in her kitchen and she lost her balanced and landed on her left side. She was able to pick herself up. She didn't seek medical attention. Educated on falls prevention, she verbalizes understanding.   [name] Morphine equivalent is 50.00 MME.   Last Oral Swab was Perormed 06/05/2023, no medication noted. [name] states she is compliant with her medication as prescribed.    Pain Inventory Average Pain 8 Pain Right Now 8 My pain is constant, sharp, and burning  In the last 24 hours, has pain interfered with the following? General activity 9 Relation with others 9 Enjoyment of life 9 What TIME of day is your pain at its worst? morning  and daytime Sleep (in general) Poor  Pain is worse with: walking, standing, and some activites Pain improves with: rest, heat/ice, and medication Relief from Meds: 5  Family History  Problem Relation Age of Onset   Cancer Mother    Heart disease Father    Heart disease Brother    Heart attack Brother        stent placement   Social History   Socioeconomic History   Marital status: Widowed    Spouse name: Not on file   Number of children: 1   Years of education: Not on file   Highest education level: Not on file  Occupational History   Not on file  Tobacco Use    Smoking status: Former    Current packs/day: 0.00    Types: Cigarettes    Quit date: 09/20/2019    Years since quitting: 3.9   Smokeless tobacco: Never  Vaping Use   Vaping status: Never Used  Substance and Sexual Activity   Alcohol use: Not Currently   Drug use: Never   Sexual activity: Yes  Other Topics Concern   Not on file  Social History Narrative   Caffeine one cup daily, coffee with milk.   Lives with son.  Widow.  Education HS.     Social Drivers of Health   Financial Resource Strain: Medium Risk (09/25/2022)   Overall Financial Resource Strain (CARDIA)    Difficulty of Paying Living Expenses: Somewhat hard  Food Insecurity: No Food Insecurity (09/25/2022)   Hunger Vital Sign    Worried About Running Out of Food in the Last Year: Never true    Ran Out of Food in the Last Year: Never true  Transportation Needs: No Transportation Needs (09/15/2022)   PRAPARE - Administrator, Civil Service (Medical): No    Lack of Transportation (Non-Medical): No  Physical Activity: Not on file  Stress: Stress Concern Present (09/20/2022)   [employer] of Occupational Health - Occupational Stress Questionnaire    Feeling of Stress : To some extent  Social Connections: Not on file   Past  Surgical History:  Procedure Laterality Date   APPENDECTOMY     1997   CERVICAL SPINE SURGERY     C4-5 spinal fusion   RIGHT OOPHORECTOMY     scalenectomy     1985   Past Surgical History:  Procedure Laterality Date   APPENDECTOMY     1997   CERVICAL SPINE SURGERY     C4-5 spinal fusion   RIGHT OOPHORECTOMY     scalenectomy     1985   Past Medical History:  Diagnosis Date   Endometriosis    Hypertension    Migraines    Parkinson's disease (HCC)    Thyroid disease    There were no vitals taken for this visit.  Opioid Risk Score:   Fall Risk Score:  `1  Depression screen [facility] 2/9     07/13/2023    9:32 AM 06/05/2023    9:07 AM 01/29/2023   12:57 PM 11/01/2022   10:03  AM 09/15/2022   11:13 AM 09/06/2022   10:18 AM 08/02/2022    9:59 AM  Depression screen PHQ 2/9  Decreased Interest 3 1 3 3 1 1 1   Down, Depressed, Hopeless 3 1 2 2 1 1    PHQ - 2 Score 6 2 5 5 2 2 1   Altered sleeping    3 1  1   Tired, decreased energy    3 3    Change in appetite    1 0    Feeling bad or failure about yourself     2 0    Trouble concentrating    0 0    Moving slowly or fidgety/restless    2 0    Suicidal thoughts     0    PHQ-9 Score    16 6  2   Difficult doing work/chores     Somewhat difficult        Review of Systems  All other systems reviewed and are negative.      Objective:   Physical Exam Vitals and nursing note reviewed.  Constitutional:      Appearance: Normal appearance.  Neck:     Comments: Cervical Paraspinal Tenderness: C-5-C-6 Cardiovascular:     Rate and Rhythm: Normal rate and regular rhythm.     Pulses: Normal pulses.     Heart sounds: Normal heart sounds.  Pulmonary:     Effort: Pulmonary effort is normal.     Breath sounds: Normal breath sounds.  Musculoskeletal:     Comments: Normal Muscle Bulk and Muscle Testing Reveals:  Upper Extremities: Decreased ROM on Right; 90 Degrees and Left: 45 Degrees  and Muscle Strength  on Right 4/5 and Left 3/5 Left AC Joint Tenderness Thoracic Paraspinal Tenderness: T-1-t-4 Mainly Left Side  Lumbar Paraspinal Tenderness: L-4-L-5 Lower Extremities: Full ROM and Muscle Strength 5/5 Arises from Table slowly Narrow Based  Gait     Skin:    General: Skin is warm and dry.  Neurological:     Mental Status: She is alert and oriented to person, place, and time.  Psychiatric:        Mood and Affect: Mood normal.        Behavior: Behavior normal.         Assessment & Plan:  1.Cervicalgia/ Cervical Radiculitis:Continue HEP as Tolerated. Continue Pregabalin .Continue to monitor. 08/29/2023 Left Arm Radicular Pain: Continue Pregabalin. Continue to Monitor.08/29/2023 2. Chronic Pain Syndrome:  Continue Hydrocodone 10 mg/325 one tablet 5  times a day as needed  for pain #150.Refill sent to accommodate scheduled appointment.  We will continue the opioid monitoring program, this consists of regular clinic visits, examinations, urine drug screen, pill counts as well as use of [place] Controlled Substance Reporting system. A 12 month History has been reviewed on the [place] Controlled Substance Reporting System Today.   Continue current medication regimen. Continue to monitor. 08/29/2023 3. Left Hand Pain: Ortho Following. Continue to monitor. 08/29/2023 4. Chronic Thoracic Pain: No complaints today. Continue current medication regimen. Continue to monitor. 08/29/2023 5. Bilateral Greater Trochanter Bursitis: No complaints today.  Continue alternate Ice and Heat Therapy . Continue to monitor. 08/29/2023 6. Left Hand Tremor: Neurology Following. Continue to monitor. 08/29/2023 7. Fall: subsequent Encounter: Educated on Falls Prevention: She verbalizes understanding.    F/U in 1 month

## 2023-08-30 ENCOUNTER — Telehealth: Payer: Self-pay | Admitting: Registered Nurse

## 2023-08-30 DIAGNOSIS — M5412 Radiculopathy, cervical region: Secondary | ICD-10-CM

## 2023-08-30 DIAGNOSIS — M542 Cervicalgia: Secondary | ICD-10-CM

## 2023-08-30 NOTE — Telephone Encounter (Signed)
 Was in contact with Dr Wynn Banker:  He agrees with MRI:  MRI ordered , call placed to Ms. Ryback regarding the above, she verbalizes understanding.

## 2023-09-14 ENCOUNTER — Encounter: Payer: Self-pay | Admitting: Family Medicine

## 2023-09-24 ENCOUNTER — Telehealth: Payer: Self-pay | Admitting: Registered Nurse

## 2023-09-24 MED ORDER — HYDROCODONE-ACETAMINOPHEN 10-325 MG PO TABS: 1.0000 | ORAL_TABLET | Freq: Every day | ORAL | 0 refills | Status: DC | PRN

## 2023-09-24 NOTE — Telephone Encounter (Signed)
 Patient canceled her appt with Emilia Harbour on 5/6 because she was sick.  She said she needed a refill on pain medication.

## 2023-09-24 NOTE — Telephone Encounter (Signed)
PMP was Reviewed.  Hydrocodone e-scribed to pharmacy.

## 2023-09-25 ENCOUNTER — Encounter: Admitting: Registered Nurse

## 2023-09-26 ENCOUNTER — Telehealth: Payer: Self-pay | Admitting: Registered Nurse

## 2023-09-26 NOTE — Telephone Encounter (Signed)
 Pharmacy needs prior auth on medication.

## 2023-09-26 NOTE — Telephone Encounter (Signed)
 PA for Hydrocodone  10-325 MG sent approved 09/25/2023-03/23/2024. Notice faxed to the pharmacy. Aaron Aas

## 2023-10-02 ENCOUNTER — Ambulatory Visit
Admission: RE | Admit: 2023-10-02 | Discharge: 2023-10-02 | Disposition: A | Discharge status: Home / Self Care | Source: Ambulatory Visit | Attending: Registered Nurse | Admitting: Registered Nurse

## 2023-10-02 MED ORDER — GADOPICLENOL 0.5 MMOL/ML IV SOLN
7.5000 mL | Freq: Once | INTRAVENOUS | Status: AC | PRN
  Administered 2023-10-02: 7.5 mL via INTRAVENOUS

## 2023-10-08 ENCOUNTER — Encounter: Attending: Registered Nurse | Admitting: Registered Nurse

## 2023-10-08 ENCOUNTER — Encounter: Payer: Self-pay | Admitting: Registered Nurse

## 2023-10-08 VITALS — BP 121/79 | HR 76 | Ht 63.0 in | Wt 175.4 lb

## 2023-10-08 DIAGNOSIS — Z79891 Long term (current) use of opiate analgesic: Secondary | ICD-10-CM | POA: Insufficient documentation

## 2023-10-08 DIAGNOSIS — Z5181 Encounter for therapeutic drug level monitoring: Secondary | ICD-10-CM | POA: Diagnosis present

## 2023-10-08 DIAGNOSIS — M545 Low back pain, unspecified: Secondary | ICD-10-CM | POA: Diagnosis present

## 2023-10-08 DIAGNOSIS — M542 Cervicalgia: Secondary | ICD-10-CM | POA: Insufficient documentation

## 2023-10-08 DIAGNOSIS — M25512 Pain in left shoulder: Secondary | ICD-10-CM | POA: Diagnosis present

## 2023-10-08 DIAGNOSIS — G8929 Other chronic pain: Secondary | ICD-10-CM | POA: Insufficient documentation

## 2023-10-08 DIAGNOSIS — G894 Chronic pain syndrome: Secondary | ICD-10-CM | POA: Insufficient documentation

## 2023-10-08 DIAGNOSIS — M5412 Radiculopathy, cervical region: Secondary | ICD-10-CM | POA: Insufficient documentation

## 2023-10-08 DIAGNOSIS — M5416 Radiculopathy, lumbar region: Secondary | ICD-10-CM | POA: Insufficient documentation

## 2023-10-08 MED ORDER — HYDROCODONE-ACETAMINOPHEN 10-325 MG PO TABS: 1.0000 | ORAL_TABLET | Freq: Every day | ORAL | 0 refills | Status: DC | PRN

## 2023-10-08 NOTE — Progress Notes (Signed)
 Subjective:    Patient ID: Bianca Goodman, female    DOB: 08/01/1960, 63 y.o.   MRN: [id number]  Bianca Goodman is a 63 y.o. female who returns for follow up appointment for chronic pain and medication refill. She states her pain is located in her  neck radiating into her left shoulder, upper back mainly left side and lower back radiating into her left lower extremity. She rates her pain 9. Her current exercise regime is walking and performing stretching exercises.  Bianca Goodman was walking to the exam room became dizzy and weak. She was given wheelchair, orthostatic blood pressure was obtain. Bianca Goodman refuses ED or Urgent Care evaluation. She will keep a Blood Pressure log and F/U with her PCP. She verbalizes understanding.   Bianca Goodman Morphine equivalent is 50.00 MME.   Oral Swab was Performed Today.    Pain Inventory Average Pain 9 Pain Right Now 9 My pain is constant  In the last 24 hours, has pain interfered with the following? General activity 9 Relation with others 9 Enjoyment of life 9 What TIME of day is your pain at its worst? morning  and daytime Sleep (in general) Poor  Pain is worse with: walking, bending, and sitting Pain improves with: rest, heat/ice, and medication Relief from Meds: 5  Family History  Problem Relation Age of Onset   Cancer Mother    Heart disease Father    Heart disease Brother    Heart attack Brother        stent placement   Social History   Socioeconomic History   Marital status: Widowed    Spouse name: Not on file   Number of children: 1   Years of education: Not on file   Highest education level: Not on file  Occupational History   Not on file  Tobacco Use   Smoking status: Former    Current packs/day: 0.00    Types: Cigarettes    Quit date: 09/20/2019    Years since quitting: 4.0   Smokeless tobacco: Never  Vaping Use   Vaping status: Never Used  Substance and Sexual Activity   Alcohol use: Not Currently   Drug use: Never   Sexual  activity: Yes  Other Topics Concern   Not on file  Social History Narrative   Caffeine one cup daily, coffee with milk.   Lives with son.  Widow.  Education HS.     Social Drivers of Health   Financial Resource Strain: Medium Risk (09/25/2022)   Overall Financial Resource Strain (CARDIA)    Difficulty of Paying Living Expenses: Somewhat hard  Food Insecurity: No Food Insecurity (09/25/2022)   Hunger Vital Sign    Worried About Running Out of Food in the Last Year: Never true    Ran Out of Food in the Last Year: Never true  Transportation Needs: No Transportation Needs (09/15/2022)   PRAPARE - Administrator, Civil Service (Medical): No    Lack of Transportation (Non-Medical): No  Physical Activity: Not on file  Stress: Stress Concern Present (09/20/2022)   [employer] of Occupational Health - Occupational Stress Questionnaire    Feeling of Stress : To some extent  Social Connections: Not on file   Past Surgical History:  Procedure Laterality Date   APPENDECTOMY     1997   CERVICAL SPINE SURGERY     C4-5 spinal fusion   RIGHT OOPHORECTOMY     scalenectomy     1985   Past  Surgical History:  Procedure Laterality Date   APPENDECTOMY     1997   CERVICAL SPINE SURGERY     C4-5 spinal fusion   RIGHT OOPHORECTOMY     scalenectomy     1985   Past Medical History:  Diagnosis Date   Endometriosis    Hypertension    Migraines    Parkinson's disease (HCC)    Thyroid  disease    BP 108/70 (Cuff Size: Normal)   Pulse 81   SpO2 98%   Opioid Risk Score:   Fall Risk Score:  `1  Depression screen [facility] 2/9     10/08/2023   11:28 AM 08/29/2023   10:04 AM 07/13/2023    9:32 AM 06/05/2023    9:07 AM 01/29/2023   12:57 PM 11/01/2022   10:03 AM 09/15/2022   11:13 AM  Depression screen PHQ 2/9  Decreased Interest 1 3 3 1 3 3 1   Down, Depressed, Hopeless 1 3 3 1 2 2 1   PHQ - 2 Score 2 6 6 2 5 5 2   Altered sleeping      3 1  Tired, decreased energy      3 3  Change in  appetite      1 0  Feeling bad or failure about yourself       2 0  Trouble concentrating      0 0  Moving slowly or fidgety/restless      2 0  Suicidal thoughts       0  PHQ-9 Score      16 6  Difficult doing work/chores       Somewhat difficult     Review of Systems  Musculoskeletal:  Positive for back pain and neck pain.       Back and increase in neck pain  All other systems reviewed and are negative.      Objective:   Physical Exam Vitals and nursing note reviewed.  Constitutional:      Appearance: Normal appearance.  Neck:     Comments: Cervical Paraspinal Tenderness: C-5-C-6 Cardiovascular:     Rate and Rhythm: Normal rate and regular rhythm.     Pulses: Normal pulses.     Heart sounds: Normal heart sounds.  Pulmonary:     Effort: Pulmonary effort is normal.     Breath sounds: Normal breath sounds.  Musculoskeletal:     Comments: Normal Muscle Bulk and Muscle Testing Reveals:  Upper Extremities: Right: Full ROM and Muscle Strength 5/5 Left Upper Extremity: Decreased ROM 45 Degrees and Muscle Strength  4/5 Left AC Joint Tenderness  Thoracic Hypersensitivity: T-1-T-7 , Lumbar Paraspinal Tenderness: L-4-L-5 Bilateral Greater Trochanter Tenderness Lower Extremities: Full ROM and Muscle Strength 5/5 Arises from table slowly Narrow Based  Gait     Skin:    General: Skin is warm and dry.  Neurological:     Mental Status: She is alert and oriented to person, place, and time.  Psychiatric:        Mood and Affect: Mood normal.        Behavior: Behavior normal.           Assessment & Plan:  1.Cervicalgia/ Cervical Radiculitis:Continue HEP as Tolerated. Continue Pregabalin  .Continue to monitor. 10/08/2023 Left Arm Radicular Pain: Continue Pregabalin . Continue to Monitor.10/08/2023 2. Chronic Pain Syndrome: Continue Hydrocodone  10 mg/325 one tablet 5  times a day as needed for pain #150.Refill sent to accommodate scheduled appointment.  We will continue the opioid  monitoring program,  this consists of regular clinic visits, examinations, urine drug screen, pill counts as well as use of [place]  Controlled Substance Reporting system. A 12 month History has been reviewed on the   Controlled Substance Reporting System Today.   Continue current medication regimen. Continue to monitor. 10/08/2023 3. Left Hand Pain: Ortho Following. Continue to monitor. 10/08/2023 4. Chronic Thoracic Pain: No complaints today. Continue current medication regimen. Continue to monitor. 10/08/2023 5. Bilateral Greater Trochanter Bursitis: No complaints today.  Continue alternate Ice and Heat Therapy . Continue to monitor. 10/08/2023 6. Left Hand Tremor: Neurology Following. Continue to monitor. 10/08/2023 7. Fall: subsequent Encounter:Denies falls this month.  Educated on Falls Prevention: She verbalizes understanding.  8. Dizziness: Orthostatic Blood Pressure Obtain: She reuses ED or Urgent care Evaluation. She will keep a blood Pressure log nd F/U with her PCP. She verbalizes understanding. F/U in 1 month

## 2023-10-08 NOTE — Patient Instructions (Addendum)
 You Arrived  to office feeling Dizzy and Off balanced. You refused going to Urgent Care or Emergency Room.   Keep Blood Pressure Log and Bring Readings to Dr Therese Flash this week.   Do Not Take Lyrica  this week, send My-Chart message on Monday 10/15/2023, with update.

## 2023-10-10 ENCOUNTER — Other Ambulatory Visit (HOSPITAL_COMMUNITY): Payer: Self-pay

## 2023-10-10 ENCOUNTER — Encounter: Payer: Self-pay | Admitting: Family Medicine

## 2023-10-10 ENCOUNTER — Ambulatory Visit (INDEPENDENT_AMBULATORY_CARE_PROVIDER_SITE_OTHER): Payer: Medicaid Other | Admitting: Family Medicine

## 2023-10-10 ENCOUNTER — Ambulatory Visit: Payer: Self-pay | Admitting: Family Medicine

## 2023-10-10 ENCOUNTER — Encounter: Attending: Registered Nurse | Admitting: Registered Nurse

## 2023-10-10 VITALS — BP 126/80 | HR 55 | Temp 97.3°F | Ht 63.0 in | Wt 175.8 lb

## 2023-10-10 DIAGNOSIS — E039 Hypothyroidism, unspecified: Secondary | ICD-10-CM

## 2023-10-10 DIAGNOSIS — F321 Major depressive disorder, single episode, moderate: Secondary | ICD-10-CM

## 2023-10-10 DIAGNOSIS — I10 Essential (primary) hypertension: Secondary | ICD-10-CM

## 2023-10-10 DIAGNOSIS — E538 Deficiency of other specified B group vitamins: Secondary | ICD-10-CM

## 2023-10-10 DIAGNOSIS — Z1211 Encounter for screening for malignant neoplasm of colon: Secondary | ICD-10-CM

## 2023-10-10 DIAGNOSIS — R7303 Prediabetes: Secondary | ICD-10-CM

## 2023-10-10 DIAGNOSIS — G20A2 Parkinson's disease without dyskinesia, with fluctuations: Secondary | ICD-10-CM

## 2023-10-10 DIAGNOSIS — J4520 Mild intermittent asthma, uncomplicated: Secondary | ICD-10-CM

## 2023-10-10 DIAGNOSIS — M792 Neuralgia and neuritis, unspecified: Secondary | ICD-10-CM

## 2023-10-10 DIAGNOSIS — Z1231 Encounter for screening mammogram for malignant neoplasm of breast: Secondary | ICD-10-CM

## 2023-10-10 LAB — VITAMIN B12: Vitamin B-12: 155 pg/mL — ABNORMAL LOW (ref 211–911)

## 2023-10-10 LAB — HEMOGLOBIN A1C: Hgb A1c MFr Bld: 6.5 % (ref 4.6–6.5)

## 2023-10-10 LAB — GLUCOSE, RANDOM: Glucose, Bld: 110 mg/dL — ABNORMAL HIGH (ref 70–99)

## 2023-10-10 MED ORDER — ALBUTEROL SULFATE HFA 108 (90 BASE) MCG/ACT IN AERS: 2.0000 | INHALATION_SPRAY | Freq: Four times a day (QID) | RESPIRATORY_TRACT | 2 refills | Status: AC | PRN

## 2023-10-10 MED ORDER — VERAPAMIL HCL 120 MG PO TABS: 120.0000 mg | ORAL_TABLET | Freq: Two times a day (BID) | ORAL | 3 refills | Status: AC

## 2023-10-10 MED ORDER — FLUOXETINE HCL 10 MG PO CAPS
10.0000 mg | ORAL_CAPSULE | Freq: Every day | ORAL | 3 refills | Status: AC
Start: 2023-10-10 — End: ?

## 2023-10-10 MED ORDER — LEVOTHYROXINE SODIUM 88 MCG PO TABS: 88.0000 ug | ORAL_TABLET | Freq: Every day | ORAL | 3 refills | Status: DC

## 2023-10-10 MED ORDER — LOSARTAN POTASSIUM 50 MG PO TABS: 50.0000 mg | ORAL_TABLET | Freq: Every day | ORAL | 3 refills | Status: DC

## 2023-10-10 NOTE — Progress Notes (Signed)
 [facility] [address] [place] [address] Dept: [phone] Dept Fax: [phone]  Chronic Care Office Visit  Subjective:    Patient ID: Bianca Goodman, female    DOB: 1961-04-28, 63 y.o..   MRN: [id number]  Chief Complaint  Patient presents with   Hypertension    3 month f/u HTN. C/o having dizzy spells/loss of balance x 4 days.     History of Present Illness:  Patient is in today for reassessment of chronic medical issues.  Ms. Borak has a history of Parkinson's disease. She is managed by Dr. Omar Bibber (neurology). She is currently managed on pramipexole  1 mg TID and Sinemet  25-100 mg QID. She continues to note tremor, though she feels this internally more than she exhibits this externally. She still notes episodes of bradykinesia and postural instability. She ahs experienced two falls in the past month.   Ms. Wake continues to work with [facility] Andy Bannister) for her chronic pain management. She is managed on pregabalin  25 mg bid and Norco. She had an MRI scan of her neck recently, but the report is still pending.   Ms. Maduro has a history of hypertension. She is managed on losartan  50 mg daily and verapamil  120 mg bid.   Ms. Michelli has a history of hypothyroidism. She is managed on levothyroxine  88 mcg daily.    Ms. Mozingo has a history of prediabetes. She has had 2 HbA1c of 6.5% but with normal fasting glucose levels and no overt signs of diabetes. Her last A1c was 6.3%.   Ms. Rottman has had low Vitamin B12 levels. I have attempted to have her start on Vitamin B12 injections, but she has not followed through on this. She is on an oral supplement.  Past Medical History: Patient Active Problem List   Diagnosis Date Noted   Asthma, mild intermittent 11/13/2022   Vitamin D  deficiency 09/14/2022   Vitamin B12 deficiency 09/14/2022   Parkinson disease (HCC) 05/03/2021   Depression, major,  single episode, moderate (HCC) 05/03/2021   Atypical squamous cell changes of undetermined significance (ASCUS) on cervical cytology with negative high risk human papilloma virus (HPV) test result 11/09/2020   Essential hypertension 10/28/2020   Hypothyroidism 10/28/2020   History of endometriosis 10/28/2020   Prediabetes 10/28/2020   Radicular pain in left arm 09/14/2020   Past Surgical History:  Procedure Laterality Date   APPENDECTOMY     1997   CERVICAL SPINE SURGERY     C4-5 spinal fusion   RIGHT OOPHORECTOMY     scalenectomy     1985   Family History  Problem Relation Age of Onset   Cancer Mother    Heart disease Father    Heart disease Brother    Heart attack Brother        stent placement   Outpatient Medications Prior to Visit  Medication Sig Dispense Refill   carbidopa -levodopa  (SINEMET  IR) 25-100 MG tablet Take 1 tablet by mouth 4 (four) times daily. 8:30 AM, 12:30 PM, 4:30 PM, and 8:30 PM daily 360 tablet 3   cyanocobalamin  (VITAMIN B12) 1000 MCG/ML injection INJECT 1 ML INTRAMUSCULARLY ONCE A WEEK FOR FOUR WEEKS THEN 1ML EVERY 28 DAYS     HYDROcodone -acetaminophen  (NORCO) 10-325 MG tablet Take 1 tablet by mouth 5 (five) times daily as needed. 150 tablet 0   hydroxypropyl methylcellulose / hypromellose (ISOPTO TEARS / GONIOVISC) 2.5 % ophthalmic solution Place 1 drop into both eyes 3 (three) times  daily as needed for dry eyes. 15 mL 12   Polyvinyl Alcohol-Povidone (ARTIFICIAL TEARS) 5-6 MG/ML SOLN Place 1 drop into both eyes 3 (three) times daily as needed. 30 mL 5   pramipexole  (MIRAPEX ) 1 MG tablet Take 1 tablet (1 mg total) by mouth 3 (three) times daily. 270 tablet 3   pregabalin  (LYRICA ) 25 MG capsule Take 1 capsule (25 mg total) by mouth at bedtime. 30 capsule 3   Vitamin D , Ergocalciferol , (DRISDOL ) 1.25 MG (50000 UNIT) CAPS capsule Take 50,000 Units by mouth once a week.     albuterol  (VENTOLIN  HFA) 108 (90 Base) MCG/ACT inhaler Inhale 2 puffs into the lungs  every 6 (six) hours as needed for wheezing or shortness of breath. 6.7 g 2   levothyroxine  (SYNTHROID ) 88 MCG tablet Take 1 tablet (88 mcg total) by mouth daily. 90 tablet 1   losartan  (COZAAR ) 50 MG tablet Take 1 tablet (50 mg total) by mouth daily. 90 tablet 1   verapamil  (CALAN ) 120 MG tablet Take 1 tablet (120 mg total) by mouth 2 (two) times daily. 180 tablet 3   No facility-administered medications prior to visit.   Allergies  Allergen Reactions   Cortizone-10 [Hydrocortisone] Rash   Tramadol  Nausea Only   Objective:   Today's Vitals   10/10/23 0828  BP: 126/80  Pulse: (!) 55  Temp: (!) 97.3 F (36.3 C)  TempSrc: Temporal  SpO2: 100%  Weight: 175 lb 12.8 oz (79.7 kg)  Height: 5\' 3"  (1.6 m)   Body mass index is 31.14 kg/m.   General: Well developed, well nourished. No acute distress. Psych: Alert and oriented. Depressed affect and mild tearfulness.  Health Maintenance Due  Topic Date Due   Pneumococcal Vaccine 88-43 Years old (1 of 2 - PCV) Never done   Zoster Vaccines- Shingrix (1 of 2) Never done   Colonoscopy  Never done   MAMMOGRAM  Never done     Assessment & Plan:   Problem List Items Addressed This Visit       Cardiovascular and Mediastinum   Essential hypertension - Primary   Blood pressure is in good control. Continue losartan  50 mg daily and verapamil  120 mg bid.      Relevant Medications   losartan  (COZAAR ) 50 MG tablet   verapamil  (CALAN ) 120 MG tablet     Respiratory   Asthma, mild intermittent   Stable. I will renew her albuterol  inhaler.      Relevant Medications   albuterol  (VENTOLIN  HFA) 108 (90 Base) MCG/ACT inhaler     Endocrine   Hypothyroidism   TSH is at goal. Continue levothyroxine  88 mcg daily.      Relevant Medications   levothyroxine  (SYNTHROID ) 88 MCG tablet     Nervous and Auditory   Parkinson disease (HCC)   Tremor improved on pramipexole  1 mg TID and Sinemet  25-100 mg QID. She will continue to follow with  neurology.        Other   Depression, major, single episode, moderate (HCC)   Ms. Pennisi continues to exhibit excessive worry and tearfulness. I would like to start her on a daily antidepressant.      Relevant Medications   FLUoxetine  (PROZAC ) 10 MG capsule   Prediabetes   I will repeat her A1c and blood sugar today.      Relevant Orders   Glucose, random   Hemoglobin A1c   Radicular pain in left arm   Working with physiatry. Cervical MRI scan is pending.  Vitamin B12 deficiency   Ms. Okazaki's vitamin B12 levels have remained low. I will reassess her level today. I explained to Ms. Schubring about the how some patient's cannot absorb B12 from the intestinal tract and therefore need injections to achieve normal B12 levels. I do think this could have impacts on her tremors and unsteady gait. If her B12 remains low, I would want her to start on B12 injections. We can seek assistance with arranging for her rides to the clinic for these, as she does not have someone at home that can assist with this.       Relevant Orders   Vitamin B12   Other Visit Diagnoses       Screening for colon cancer       Relevant Orders   Cologuard     Encounter for screening mammogram for malignant neoplasm of breast       Relevant Orders   MM DIGITAL SCREENING BILATERAL       Return in about 3 months (around 01/10/2024) for Reassessment.   Graig Lawyer, MD

## 2023-10-10 NOTE — Assessment & Plan Note (Signed)
 Tremor improved on pramipexole 1 mg TID and Sinemet 25-100 mg QID. She will continue to follow with neurology.

## 2023-10-10 NOTE — Assessment & Plan Note (Signed)
Stable. I will renew her albuterol inhaler. 

## 2023-10-10 NOTE — Assessment & Plan Note (Signed)
Blood pressure is in good control. Continue losartan 50 mg daily and verapamil 120 mg bid.

## 2023-10-10 NOTE — Assessment & Plan Note (Signed)
 Working with physiatry. Cervical MRI scan is pending.

## 2023-10-10 NOTE — Assessment & Plan Note (Addendum)
 Bianca Goodman's vitamin B12 levels have remained low. I will reassess her level today. I explained to Bianca Goodman about the how some patient's cannot absorb B12 from the intestinal tract and therefore need injections to achieve normal B12 levels. I do think this could have impacts on her tremors and unsteady gait. If her B12 remains low, I would want her to start on B12 injections. We can seek assistance with arranging for her rides to the clinic for these, as she does not have someone at home that can assist with this.

## 2023-10-10 NOTE — Assessment & Plan Note (Signed)
 Ms. Thier continues to exhibit excessive worry and tearfulness. I would like to start her on a daily antidepressant.

## 2023-10-10 NOTE — Assessment & Plan Note (Signed)
I will repeat her A1c and blood sugar today.

## 2023-10-10 NOTE — Assessment & Plan Note (Signed)
 TSH is at goal. Continue levothyroxine 88 mcg daily.

## 2023-10-12 LAB — DRUG TOX MONITOR 1 W/CONF, ORAL FLD
Amphetamines: NEGATIVE ng/mL (ref <10)
Barbiturates: NEGATIVE ng/mL (ref <10)
Benzodiazepines: NEGATIVE ng/mL (ref <0.50)
Buprenorphine: NEGATIVE ng/mL (ref <0.10)
Cocaine: NEGATIVE ng/mL (ref <5.0)
Codeine: NEGATIVE ng/mL (ref <2.5)
Dihydrocodeine: 22.6 ng/mL — ABNORMAL HIGH (ref <2.5)
Fentanyl: NEGATIVE ng/mL (ref <0.10)
Heroin Metabolite: NEGATIVE ng/mL (ref <1.0)
Hydrocodone: >250 ng/mL — ABNORMAL HIGH (ref <2.5)
Hydromorphone: NEGATIVE ng/mL (ref <2.5)
MARIJUANA: NEGATIVE ng/mL (ref <2.5)
MDMA: NEGATIVE ng/mL (ref <10)
Meprobamate: NEGATIVE ng/mL (ref <2.5)
Methadone: NEGATIVE ng/mL (ref <5.0)
Morphine: NEGATIVE ng/mL (ref <2.5)
Nicotine Metabolite: NEGATIVE ng/mL (ref <5.0)
Norhydrocodone: 4.7 ng/mL — ABNORMAL HIGH (ref <2.5)
Noroxycodone: NEGATIVE ng/mL (ref <2.5)
Opiates: POSITIVE ng/mL — AB (ref <2.5)
Oxycodone: NEGATIVE ng/mL (ref <2.5)
Oxymorphone: NEGATIVE ng/mL (ref <2.5)
Phencyclidine: NEGATIVE ng/mL (ref <10)
Tapentadol: NEGATIVE ng/mL (ref <5.0)
Tramadol: NEGATIVE ng/mL (ref <5.0)
Zolpidem: NEGATIVE ng/mL (ref <5.0)

## 2023-10-12 LAB — DRUG TOX ALC METAB W/CON, ORAL FLD: Alcohol Metabolite: NEGATIVE ng/mL (ref <25)

## 2023-10-16 ENCOUNTER — Telehealth: Payer: Self-pay

## 2023-10-16 NOTE — Progress Notes (Signed)
 Complex Care Management Note Care Guide Note  10/16/2023 Name: Bianca Goodman MRN: [id number] DOB: [birth year]   Complex Care Management Outreach Attempts: An unsuccessful telephone outreach was attempted today to offer the patient information about available complex care management services.  Follow Up Plan:  Additional outreach attempts will be made to offer the patient complex care management information and services.   Encounter Outcome:  No Answer  [facility]  [facility], [facility] Health Care Management Assistant Direct Dial: [phone]  Fax: [phone]

## 2023-10-17 ENCOUNTER — Telehealth: Payer: Self-pay | Admitting: Adult Health

## 2023-10-17 ENCOUNTER — Ambulatory Visit: Payer: Medicaid Other | Admitting: Adult Health

## 2023-10-17 ENCOUNTER — Ambulatory Visit: Admitting: Neurology

## 2023-10-17 NOTE — Telephone Encounter (Signed)
 Transportation cx on pt, she has been r/s

## 2023-10-18 NOTE — Progress Notes (Signed)
 Complex Care Management Note Care Guide Note  10/18/2023 Name: Bianca Goodman MRN: [id number] DOB: [birth year]   Complex Care Management Outreach Attempts: A second unsuccessful outreach was attempted today to offer the patient with information about available complex care management services.  Follow Up Plan:  Additional outreach attempts will be made to offer the patient complex care management information and services.   Encounter Outcome:  No Answer  [facility]  [facility], [facility] Health Care Management Assistant Direct Dial: [phone]  Fax: [phone]

## 2023-10-22 NOTE — Progress Notes (Signed)
 Complex Care Management Note Care Guide Note  10/22/2023 Name: Bianca Goodman MRN: [id number] DOB: 21-Jun-1960   Complex Care Management Outreach Attempts: A second unsuccessful outreach was attempted today to offer the patient with information about available complex care management services.  Follow Up Plan:  No further outreach attempts will be made at this time. We have been unable to contact the patient to offer or enroll patient in complex care management services.  Encounter Outcome:  No Answer  [facility]  [facility], [facility] Health Care Management Assistant Direct Dial: [phone]  Fax: [phone]

## 2023-11-14 ENCOUNTER — Encounter: Attending: Registered Nurse | Admitting: Registered Nurse

## 2023-11-14 ENCOUNTER — Encounter: Payer: Self-pay | Admitting: Registered Nurse

## 2023-11-14 VITALS — BP 121/72 | HR 91 | Ht 63.0 in | Wt 175.8 lb

## 2023-11-14 DIAGNOSIS — G894 Chronic pain syndrome: Secondary | ICD-10-CM | POA: Diagnosis not present

## 2023-11-14 DIAGNOSIS — M545 Low back pain, unspecified: Secondary | ICD-10-CM | POA: Insufficient documentation

## 2023-11-14 DIAGNOSIS — Z5181 Encounter for therapeutic drug level monitoring: Secondary | ICD-10-CM | POA: Diagnosis not present

## 2023-11-14 DIAGNOSIS — M542 Cervicalgia: Secondary | ICD-10-CM | POA: Diagnosis not present

## 2023-11-14 DIAGNOSIS — G8929 Other chronic pain: Secondary | ICD-10-CM | POA: Insufficient documentation

## 2023-11-14 DIAGNOSIS — Z79891 Long term (current) use of opiate analgesic: Secondary | ICD-10-CM | POA: Insufficient documentation

## 2023-11-14 DIAGNOSIS — M5412 Radiculopathy, cervical region: Secondary | ICD-10-CM | POA: Diagnosis not present

## 2023-11-14 DIAGNOSIS — M25512 Pain in left shoulder: Secondary | ICD-10-CM | POA: Insufficient documentation

## 2023-11-14 MED ORDER — HYDROCODONE-ACETAMINOPHEN 10-325 MG PO TABS: 1.0000 | ORAL_TABLET | Freq: Every day | ORAL | 0 refills | Status: DC | PRN

## 2023-11-14 NOTE — Progress Notes (Signed)
 Subjective:    Patient ID: Bianca Goodman, female    DOB: 09/08/1960, 63 y.o.   MRN: [id number]  HPI: Bianca Goodman is a 63 y.o. female whose appointment was changed to telephone visit, Bianca Goodman called the office reporting she was ill.  I connected with  Bianca Goodman by telephone and verified that I am speaking with the correct person using two identifiers.  Location: Patient: In her Home Provider: In the Office    I discussed the limitations, risks, security and privacy concerns of performing an evaluation and management service by telephone and the availability of in person appointments. I also discussed with the patient that there may be a patient responsible charge related to this service. The patient expressed understanding and agreed to proceed. She states her pain is located in her neck radiating into her left shoulder and mid- lower back. She rates her pain 9. Her current exercise regime is walking and performing stretching exercises.  Bianca Goodman Morphine equivalent is 50.00 MME.   Last Oral Swab was Performed on 10/08/2023, it was consistent.    Pain Inventory Average Pain 9 Pain Right Now 9 My pain is constant, sharp, and neck  In the last 24 hours, has pain interfered with the following? General activity 9 Relation with others 9 Enjoyment of life 9 What TIME of day is your pain at its worst? morning  Sleep (in general) Poor  Pain is worse with: standing and some activites Pain improves with: rest and medication Relief from Meds: 6  Family History  Problem Relation Age of Onset   Cancer Mother    Heart disease Father    Heart disease Brother    Heart attack Brother        stent placement   Social History   Socioeconomic History   Marital status: Widowed    Spouse name: Not on file   Number of children: 1   Years of education: Not on file   Highest education level: Not on file  Occupational History   Not on file  Tobacco Use   Smoking status: Former    Current  packs/day: 0.00    Types: Cigarettes    Quit date: 09/20/2019    Years since quitting: 4.1   Smokeless tobacco: Never  Vaping Use   Vaping status: Never Used  Substance and Sexual Activity   Alcohol use: Not Currently   Drug use: Never   Sexual activity: Yes  Other Topics Concern   Not on file  Social History Narrative   Caffeine one cup daily, coffee with milk.   Lives with son.  Widow.  Education HS.     Social Drivers of Health   Financial Resource Strain: Medium Risk (09/25/2022)   Overall Financial Resource Strain (CARDIA)    Difficulty of Paying Living Expenses: Somewhat hard  Food Insecurity: No Food Insecurity (09/25/2022)   Hunger Vital Sign    Worried About Running Out of Food in the Last Year: Never true    Ran Out of Food in the Last Year: Never true  Transportation Needs: No Transportation Needs (09/15/2022)   PRAPARE - Administrator, Civil Service (Medical): No    Lack of Transportation (Non-Medical): No  Physical Activity: Not on file  Stress: Stress Concern Present (09/20/2022)   [employer] of Occupational Health - Occupational Stress Questionnaire    Feeling of Stress : To some extent  Social Connections: Not on file   Past Surgical History:  Procedure  Laterality Date   APPENDECTOMY     1997   CERVICAL SPINE SURGERY     C4-5 spinal fusion   RIGHT OOPHORECTOMY     scalenectomy     1985   Past Surgical History:  Procedure Laterality Date   APPENDECTOMY     1997   CERVICAL SPINE SURGERY     C4-5 spinal fusion   RIGHT OOPHORECTOMY     scalenectomy     1985   Past Medical History:  Diagnosis Date   Endometriosis    Hypertension    Migraines    Parkinson's disease (HCC)    Thyroid  disease    BP 121/72   Pulse 91   Ht 5' 3 (1.6 m)   Wt 175 lb 12.8 oz (79.7 kg) Comment: last recorded  BMI 31.14 kg/m   Opioid Risk Score:   Fall Risk Score:  `1  Depression screen [facility] 2/9     11/14/2023    9:56 AM 10/10/2023    8:56 AM  10/08/2023   11:28 AM 08/29/2023   10:04 AM 07/13/2023    9:32 AM 06/05/2023    9:07 AM 01/29/2023   12:57 PM  Depression screen PHQ 2/9  Decreased Interest  3 1 3 3 1 3   Down, Depressed, Hopeless  2 1 3 3 1 2   PHQ - 2 Score  5 2 6 6 2 5   Altered sleeping 3 1       Tired, decreased energy 3 3       Change in appetite  1       Feeling bad or failure about yourself   0       Trouble concentrating  1       Moving slowly or fidgety/restless  1       Suicidal thoughts  0       PHQ-9 Score  12       Difficult doing work/chores  Somewhat difficult         Review of Systems  Musculoskeletal:  Positive for neck pain.  All other systems reviewed and are negative.       Objective:   Physical Exam Vitals and nursing note reviewed.  Musculoskeletal:     Comments: No Physical Exam Performed: Telephone Visit          Assessment & Plan:  1.Cervicalgia/ Cervical Radiculitis:Continue HEP as Tolerated. Continue Pregabalin  .Continue to monitor. 11/14/2023 Left Arm Radicular Pain: Continue Pregabalin . Continue to Monitor.11/14/2023 2. Chronic Pain Syndrome: Continue Hydrocodone  10 mg/325 one tablet 5  times a day as needed for pain #150.Refill sent to accommodate scheduled appointment.  We will continue the opioid monitoring program, this consists of regular clinic visits, examinations, urine drug screen, pill counts as well as use of [place]  Controlled Substance Reporting system. A 12 month History has been reviewed on the [place]  Controlled Substance Reporting System Today.   Continue current medication regimen. Continue to monitor. 11/14/2023 3. Left Hand Pain: Ortho Following. Continue to monitor. 11/14/2023 4. Chronic Thoracic Pain: No complaints today. Continue current medication regimen. Continue to monitor. 11/14/2023 5. Bilateral Greater Trochanter Bursitis: No complaints today.  Continue alternate Ice and Heat Therapy . Continue to monitor. 11/14/2023 6. Left Hand Tremor:  Neurology Following. Continue to monitor. 11/14/2023 7. Fall: subsequent Encounter:Denies falls this month.  Educated on Falls Prevention: She verbalizes understanding. 11/14/2023  F/U in 1 month  Telephone Visit Established Patient Location of Patient: In her Home Total Time Spent; 10 Minutes

## 2023-12-03 ENCOUNTER — Ambulatory Visit: Admitting: Neurology

## 2023-12-03 ENCOUNTER — Encounter: Payer: Self-pay | Admitting: Neurology

## 2023-12-03 VITALS — BP 137/84 | HR 92 | Ht 62.0 in | Wt 174.6 lb

## 2023-12-03 DIAGNOSIS — G4719 Other hypersomnia: Secondary | ICD-10-CM

## 2023-12-03 DIAGNOSIS — R443 Hallucinations, unspecified: Secondary | ICD-10-CM | POA: Diagnosis not present

## 2023-12-03 DIAGNOSIS — R269 Unspecified abnormalities of gait and mobility: Secondary | ICD-10-CM

## 2023-12-03 DIAGNOSIS — M542 Cervicalgia: Secondary | ICD-10-CM | POA: Diagnosis not present

## 2023-12-03 DIAGNOSIS — G479 Sleep disorder, unspecified: Secondary | ICD-10-CM

## 2023-12-03 DIAGNOSIS — G8929 Other chronic pain: Secondary | ICD-10-CM

## 2023-12-03 DIAGNOSIS — G20A2 Parkinson's disease without dyskinesia, with fluctuations: Secondary | ICD-10-CM | POA: Diagnosis not present

## 2023-12-03 DIAGNOSIS — G20C Parkinsonism, unspecified: Secondary | ICD-10-CM

## 2023-12-03 MED ORDER — CARBIDOPA-LEVODOPA 25-100 MG PO TABS: 1.0000 | ORAL_TABLET | Freq: Every day | ORAL | 3 refills | Status: AC

## 2023-12-03 MED ORDER — PRAMIPEXOLE DIHYDROCHLORIDE 1 MG PO TABS: 1.0000 mg | ORAL_TABLET | Freq: Three times a day (TID) | ORAL | 3 refills | Status: AC

## 2023-12-03 NOTE — Patient Instructions (Signed)
 We will increase your levodopa  to 1 pill 5 times a day: 8:30, 12:30, 4:30 PM, 8:30 PM and 11 PM.  I will prescribe a walker. We will send the order to [facility].  Talk to your primary care about increasing your antidepressant medication.  Follow up with pain management and talk to them about reducing your hydrocodone , as it can cause your daytime sleepiness and increase your risk for hallucinations.

## 2023-12-03 NOTE — Progress Notes (Signed)
 Subjective:    Patient ID: Bianca Goodman is a 63 y.o. female.  HPI    Interim history:   Bianca Goodman is a 63 year old right-handed woman with an underlying medical history of chronic neck pain, on chronic narcotic pain medication, hypertension, hypothyroidism, and mildly overweight state, who presents for follow-up consultation of her parkinsonism.  The patient is accompanied by a Spanish interpreter today.  She was last seen in our clinic by Harlene Bogaert, NP on 04/02/2023, at which time she was referred to neurorehab.  She was advised to continue with pramipexole  and increase Sinemet  to 1 pill 4 times a day.  A toilet safety rail was ordered.    Today, 12/03/2023: She reports feeling worse. She has more stiffness, tremor comes and goes, sometimes it is bad enough that she has to squeeze her hand in between her legs to make it less violent.  She has had intermittent visual hallucinations, these come and go and are not scary.  She does not sleep well at night, sleeps more during the day, she also has a tendency to snack or eat in the middle of the night and reports weight gain because of this.  She had a neck MRI in May and I reviewed the results and advised her that it showed degenerative changes.  She reports that she did not get the results from the ordering provider.  She has an appointment with her pain management provider who ordered the test this month on the 25th and patient is aware.  The patient's allergies, current medications, family history, past medical history, past social history, past surgical history and problem list were reviewed and updated as appropriate.    Previously:    04/02/2023 Bianca Bogaert, NP): <<HPI: Bianca Goodman is a 63 y.o. female who is routinely followed in office for left-sided predominance parkinsonism with symptom onset around 2021.  Initially started on pramipexole  in 2022 and started on carbidopa  levodopa  06/2022 with complaints of worsening tremor and stiffness.     At prior visit, increased carbidopa  levodopa  to 1 pill 4 times daily and continued on pramipexole  1 pill TID. Noted swallowing difficulty, planned to continue to monitor, consider swallowing study if needed.   Interval history:    Patient unaccompanied today, declined interpreter services. Overall stable since prior visit. She apparently did not increase Sinemet  dosage as previously advised, she continues to take 1 tab upon awakening, then 1pm and 7pm, takes pramipexole  at the same times. She does c/o increased stiffness and gait difficulty, essentially unchanged since prior visit.  She questions use of RW, currently without use of AD, denies any recent falls. She does have difficulty standing from seated position especially from places like her bed and toilet as they do not have hand rails. Continues imbalance greater when looking up or down such as drinking water. Swallowing difficulty persists but denies worsening. Her main concern today is in regards to continued neck pain, she is currently being followed by PMR for pain management but denies much improvement, neck pain greatly interferes with daily activity as well as sleep. >>   09/27/2022: She reports having had more stiffness and trembling in the left hand, but it is intermittent, some days are good and some days are significantly worse, no obvious rhyme or reason, has trigger finger in her left hand ring finger.  She has used an over-the-counter splint which seems to help.  Her blood pressure is elevated today but she reports headache and ongoing neck pain, she reports that  she was considered for neck injection but since she has an allergy to cortisone she cannot get it done.  She has fallen about a couple of months ago, she landed on her right side, no serious injury but had a bruise and she used an ice pack.  She feels like she could fall forward or backward if she bends over or if she leans back to drink water.  Sometimes she feels that there  is something stuck in her throat or it is hard to swallow but has had no choking episode except for 1 time where she felt she was choking, no coughing while drinking water.  She makes sure she is upright when she eats or drinks.  She tolerates the medication, takes the pramipexole  and levodopa  together 3 times a day, 8:30 AM, 3 PM and around 10 PM daily.  She would be willing to increase the levodopa .   She saw Harlene Bogaert, NP on 06/28/2022, at which time she reported intermittent freezing and worsening tremors.  She was advised to start Sinemet  25-100 mg strength half a pill 3 times daily with gradual increase to 1 pill 3 times daily in about a month.  She was advised to continue with her pramipexole  1 mg 3 times daily.  She was encouraged to follow-up with PCP for residual depression. I saw her on 02/14/2022, at which time she reported doing about the same, she was tolerating pramipexole  at 1-1/2 pills 3 times a day, taken at 7 AM, 2 PM and 9:30 or 10 PM.  She was also on pain medication and gabapentin . She was on Prozac . She was advised to increase her pramipexole  to 1 mg 3 times daily.  For residual depression, she was advised to follow-up with PCP.     I saw her on 06/29/2021, at which time she reported tolerating pramipexole  and low-dose.  She was taking 0.375 mg 3 times daily.  She had noticed some benefit with regards to improvement in her stiffness and fine motor control.  She was advised to increase her pramipexole  to 0.5 mg 3 times daily and eventually to 0.75 mg 3 times daily.     I first met her at the request of her pain management nurse practitioner on 02/21/2021, at which time she reported a several month history of a tremor affecting her left upper extremity.  In addition, she had noticed stiffness and pain in her hands, fine motor dyscontrol and overall slowness in her movements.  She had moved from [place]  recently.  She was noted to have parkinsonian features.  I asked her to start a  dopamine agonist, namely pramipexole  in low-dose with gradual titration.   The patient recently saw her pain management provider on 06/08/2021.  She has a follow-up this month.  She saw her primary care in December on 05/03/2021 and reported a modest reduction in her tremor in the left hand side. She was started on Prozac  for depression.     02/21/21: (She) reports an approximately 61-month history of tremors affecting her left upper extremity.  In addition, she has noticed stiffness and pain in her hands, inability to fully extend or close her hand, she has had slowness and changes in her walk, she feels slower, sometimes she feels stupid.  She moved from [place]  some 7 or 8 months ago with her son.  She lives with her only son and her sister.  She has a total of 1 sister and 3 brothers, none of whom have tremors, no  family history of Parkinson's disease.  She fell about a month ago in the parking lot at the doctor's office.  She hit her lower back, she did not hit her head or passed out thankfully.  She had fallen some years ago, she was still residing in [place]  at the time, she was on [place] at the time.  She has chronic neck pain, she has had neck problems since 2005, she is status post ACDF in 2005.   She had a recent cervical spine MRI without contrast on 11/13/2020 and I reviewed the results: IMPRESSION: 1. Disc bulge with uncovertebral spurring at C6-7 with resultant moderate left C7 foraminal stenosis. This is suspected to be the symptomatic level. 2. Degenerative disc bulge with uncovertebral spurring at C5-6 with resultant mild to moderate left and mild right C6 foraminal stenosis. 3. Broad-based disc bulge at C3-4 with resultant mild spinal stenosis. 4. Prior ACDF at C4-5 without residual or recurrent stenosis.   I reviewed your office note from 11/23/2020.    She is a non-smoker and does not drink alcohol, she limits her caffeine to 1 cup of coffee in the morning.  She hydrates well with  water.   She is widowed, she is not currently working.  She has worked in [employer] before.  She is always been independent.  She gets tearful towards the end of our visit as we discussed her possible diagnosis.  She indicates that her sister-in-law who is a physician in the [place], indicated to her that she may have Parkinson's disease and she did not believe her sister-in-law.    Her Past Medical History Is Significant For: Past Medical History:  Diagnosis Date   Endometriosis    Hypertension    Migraines    Parkinson's disease (HCC)    Thyroid  disease     Her Past Surgical History Is Significant For: Past Surgical History:  Procedure Laterality Date   APPENDECTOMY     1997   CERVICAL SPINE SURGERY     C4-5 spinal fusion   RIGHT OOPHORECTOMY     scalenectomy     1985    Her Family History Is Significant For: Family History  Problem Relation Age of Onset   Cancer Mother    Heart disease Father    Heart disease Brother    Heart attack Brother        stent placement   Parkinson's disease Neg Hx     Her Social History Is Significant For: Social History   Socioeconomic History   Marital status: Widowed    Spouse name: Not on file   Number of children: 1   Years of education: Not on file   Highest education level: Not on file  Occupational History   Not on file  Tobacco Use   Smoking status: Former    Current packs/day: 0.00    Types: Cigarettes    Quit date: 09/20/2019    Years since quitting: 4.2   Smokeless tobacco: Never  Vaping Use   Vaping status: Never Used  Substance and Sexual Activity   Alcohol use: Not Currently   Drug use: Never   Sexual activity: Yes  Other Topics Concern   Not on file  Social History Narrative   Caffeine one cup daily, coffee with milk.   Lives with son.  Widow.  Education HS.     Social Drivers of Health   Financial Resource Strain: Medium Risk (09/25/2022)   Overall Financial Resource Strain (CARDIA)  Difficulty of Paying Living Expenses: Somewhat hard  Food Insecurity: No Food Insecurity (09/25/2022)   Hunger Vital Sign    Worried About Running Out of Food in the Last Year: Never true    Ran Out of Food in the Last Year: Never true  Transportation Needs: No Transportation Needs (09/15/2022)   PRAPARE - Administrator, Civil Service (Medical): No    Lack of Transportation (Non-Medical): No  Physical Activity: Not on file  Stress: Stress Concern Present (09/20/2022)   [employer] of Occupational Health - Occupational Stress Questionnaire    Feeling of Stress : To some extent  Social Connections: Not on file    Her Allergies Are:  Allergies  Allergen Reactions   Cortizone-10 [Hydrocortisone] Rash   Tramadol  Nausea Only  :   Her Current Medications Are:  Outpatient Encounter Medications as of 12/03/2023  Medication Sig   albuterol  (VENTOLIN  HFA) 108 (90 Base) MCG/ACT inhaler Inhale 2 puffs into the lungs every 6 (six) hours as needed for wheezing or shortness of breath.   carbidopa -levodopa  (SINEMET  IR) 25-100 MG tablet Take 1 tablet by mouth 4 (four) times daily. 8:30 AM, 12:30 PM, 4:30 PM, and 8:30 PM daily   cyanocobalamin  (VITAMIN B12) 1000 MCG/ML injection INJECT 1 ML INTRAMUSCULARLY ONCE A WEEK FOR FOUR WEEKS THEN 1ML EVERY 28 DAYS   FLUoxetine  (PROZAC ) 10 MG capsule Take 1 capsule (10 mg total) by mouth daily.   HYDROcodone -acetaminophen  (NORCO) 10-325 MG tablet Take 1 tablet by mouth 5 (five) times daily as needed.   hydroxypropyl methylcellulose / hypromellose (ISOPTO TEARS / GONIOVISC) 2.5 % ophthalmic solution Place 1 drop into both eyes 3 (three) times daily as needed for dry eyes.   levothyroxine  (SYNTHROID ) 88 MCG tablet Take 1 tablet (88 mcg total) by mouth daily.   losartan  (COZAAR ) 50 MG tablet Take 1 tablet (50 mg total) by mouth daily.   Polyvinyl Alcohol-Povidone (ARTIFICIAL TEARS) 5-6 MG/ML SOLN Place 1 drop into both eyes 3 (three) times daily as  needed.   pramipexole  (MIRAPEX ) 1 MG tablet Take 1 tablet (1 mg total) by mouth 3 (three) times daily.   pregabalin  (LYRICA ) 25 MG capsule Take 1 capsule (25 mg total) by mouth at bedtime.   verapamil  (CALAN ) 120 MG tablet Take 1 tablet (120 mg total) by mouth 2 (two) times daily.   Vitamin D , Ergocalciferol , (DRISDOL ) 1.25 MG (50000 UNIT) CAPS capsule Take 50,000 Units by mouth once a week.   No facility-administered encounter medications on file as of 12/03/2023.  :  Review of Systems:  Out of a complete 14 point review of systems, all are reviewed and negative with the exception of these symptoms as listed below:   Review of Systems  Neurological:        Pt here for parkinson f/u Pt states parkinson worse . Pt states having freezing episodes, increased neck pain, gait is off and having issues getting in and out of bed Pt is very tearful due to increased symptoms Pt states had MR cervical spine 09/2023 and results were never reviewed with her.    Objective:  Neurological Exam  Physical Exam Physical Examination:   Vitals:   12/03/23 0945  BP: 137/84  Pulse: 92    General Examination: The patient is a very pleasant 63 y.o. female in no acute distress. She appears chronically ill and deconditioned, intermittently tearful during this visit.    HEENT: Normocephalic, atraumatic, pupils are equal, round and reactive to light, extraocular tracking  is fairly well-preserved, face is symmetric with moderate facial masking, moderate to severe nuchal rigidity, mild to moderate limitation to neck turns to both sides.  Hearing grossly intact.  She has no lip, neck or jaw tremor.  She has no significant hypophonia, no dysarthria or voice tremor.  Airway examination reveals mild mouth dryness, otherwise stable findings.  Tongue protrudes centrally and palate elevates symmetrically.  Removes her facemask for this exam part.   Chest: Clear to auscultation without wheezing, rhonchi or crackles noted.    Heart: S1+S2+0, regular and normal without murmurs, rubs or gallops noted.    Abdomen: Soft, non-tender and non-distended.   Extremities: There is nonpitting puffiness around the ankles and distal lower extremities bilaterally.  She is wearing puffy socks.      Skin: Warm and dry without trophic changes noted.    Musculoskeletal: exam reveals no obvious joint deformities, with the exception of limited range of motion in the neck, left hand trigger finger in the ring finger.     Neurologically:  Mental status: The patient is awake, alert and oriented in all 4 spheres. Her immediate and remote memory, attention, language skills and fund of knowledge are appropriate. There is no evidence of aphasia, agnosia, apraxia or anomia. Speech is clear with normal prosody and enunciation. Thought process is linear. Mood is constricted and affect is blunted.  Intermittently tearful today. Cranial nerves II - XII are as described above under HEENT exam.  Her right shoulder is mildly higher than left at baseline.     (On 02/21/2021: On Archimedes spiral drawing she was unable to do the task with the left hand, with the right hand there was no trembling.  Handwriting with the right hand was legible, not micrographic, not tremulous.)   Motor exam: Normal bulk, and strength is noted. She has an increased tone in the left more than right upper extremity with mild cogwheeling noted.  She has an intermittent mild resting tremor in the left and right upper extremity.  She has no significant postural or action tremor.  She does have difficulty extending her fingers and reports stiffness and pain in the left hand.  Romberg is not tested due to safety concerns.  On fine motor testing, she has moderate difficulty in both upper extremities and lower extremities.  Cerebellar testing: No dysmetria or intention tremor. There is no truncal or gait ataxia.  Sensory exam: intact to light touch in the upper and lower extremities.   Gait, station and balance: She stands stands with difficulty and requires a little assistance.  She has a moderately stooped posture which has become worse.  She has some started steps.  She walks with smaller steps and slower pace, no walking aid.   Start hesitation noted as well.  Balance is mildly impaired.     Assessment and plan:    In summary, Bianca Goodman is a very pleasant 63 year old female with an underlying medical history of chronic neck pain with degenerative neck disease, status post surgery in 2005, on chronic narcotic pain medication, followed by pain management, hypertension, hypothyroidism, and borderline obesity, who presents for follow-up consultation of her left-sided predominant parkinsonism disease of approximately 3-4 years duration. We started pramipexole  in low-dose in late 2022 and gradually increased it to her current dose of 1 mg 3 times daily, which we increased in September 2023.  She was started on levodopa  in January 2024.  She is tolerating has been on 1 pill 4 times a day for  the past 6 months.  She is advised to increase it further to 1 pill 5 times a day at this time.  We talked about the challenges of advancing Parkinson's disease including hallucinations, which may be in part because of the Parkinson's disease but also because of narcotic pain medication.  She is advised to follow-up with her pain management provider and discussed the possibility of scaling back on hydrocodone .  She is advised to continue with pramipexole  1 pill 3 times a day and increase levodopa  to 1 pill 5 times a day, 4 hourly, starting at 8:30 AM, last dose at bedtime.   She is advised to follow-up with her primary care to discuss the possibility of increasing her Prozac  as she has depressive symptoms.   We talked about the importance of maintaining a healthy lifestyle, good sleep hygiene and avoiding eating at night and avoiding naps during the day.   I will prescribe a 2 wheeled walker for her.   She is advised to follow-up routinely in this clinic to see the nurse practitioner in about 6 months, sooner if needed.  I answered all her questions today and she was in agreement.   I did advise her about her most recent neck MRI results from May 2025.  She is advised to follow-up with her provider as scheduled later this month. I spent 40 minutes in total face-to-face time and in reviewing records during pre-charting, more than 50% of which was spent in counseling and coordination of care, reviewing test results, reviewing medications and treatment regimen and/or in discussing or reviewing the diagnosis of PD, chronic neck pain, the prognosis and treatment options. Pertinent laboratory and imaging test results that were available during this visit with the patient were reviewed by me and considered in my medical decision making (see chart for details).

## 2023-12-05 ENCOUNTER — Telehealth: Payer: Self-pay

## 2023-12-05 ENCOUNTER — Other Ambulatory Visit (HOSPITAL_COMMUNITY): Payer: Self-pay

## 2023-12-05 NOTE — Telephone Encounter (Signed)
 Pharmacy Patient Advocate Encounter   Received notification from Fax that prior authorization for Carbidopa -levodopa  25-100mg  Tablet (Sinemet  IR) is required/requested.   Insurance verification completed.   The patient is insured through [facility] [place] .   Per test claim: Refill too soon. PA is not needed at this time. Medication was filled 12/03/2023. Next eligible fill date is 02/08/2024.

## 2023-12-13 NOTE — Progress Notes (Signed)
 Subjective:    Patient ID: Bianca Goodman, female    DOB: 04/27/1961, 63 y.o.   MRN: [id number]  HPI: Bianca Goodman is a 63 y.o. female who returns for follow up appointment for chronic pain and medication refill. She states her  pain is located in her  neck radiating into her left shoulder and mid- back. She rates her pain 6. Her current exercise regime is walking and performing stretching exercises.  Ms. Andrew Morphine equivalent is 50.00 MME.   Last Oral Swab was Performed on 10/08/2023, it was consistent.      Pain Inventory Average Pain 6 Pain Right Now 6 My pain is sharp and burning  In the last 24 hours, has pain interfered with the following? General activity 8 Relation with others 8 Enjoyment of life 8 What TIME of day is your pain at its worst? daytime Sleep (in general) Poor  Pain is worse with: walking Pain improves with: rest, heat/ice, and medication Relief from Meds: 8  Family History  Problem Relation Age of Onset   Cancer Mother    Heart disease Father    Heart disease Brother    Heart attack Brother        stent placement   Parkinson's disease Neg Hx    Social History   Socioeconomic History   Marital status: Widowed    Spouse name: Not on file   Number of children: 1   Years of education: Not on file   Highest education level: Not on file  Occupational History   Not on file  Tobacco Use   Smoking status: Former    Current packs/day: 0.00    Types: Cigarettes    Quit date: 09/20/2019    Years since quitting: 4.2   Smokeless tobacco: Never  Vaping Use   Vaping status: Never Used  Substance and Sexual Activity   Alcohol use: Not Currently   Drug use: Never   Sexual activity: Yes  Other Topics Concern   Not on file  Social History Narrative   Caffeine one cup daily, coffee with milk.   Lives with son.  Widow.  Education HS.     Social Drivers of Health   Financial Resource Strain: Medium Risk (09/25/2022)   Overall Financial Resource Strain  (CARDIA)    Difficulty of Paying Living Expenses: Somewhat hard  Food Insecurity: No Food Insecurity (09/25/2022)   Hunger Vital Sign    Worried About Running Out of Food in the Last Year: Never true    Ran Out of Food in the Last Year: Never true  Transportation Needs: No Transportation Needs (09/15/2022)   PRAPARE - Administrator, Civil Service (Medical): No    Lack of Transportation (Non-Medical): No  Physical Activity: Not on file  Stress: Stress Concern Present (09/20/2022)   [employer] of Occupational Health - Occupational Stress Questionnaire    Feeling of Stress : To some extent  Social Connections: Not on file   Past Surgical History:  Procedure Laterality Date   APPENDECTOMY     1997   CERVICAL SPINE SURGERY     C4-5 spinal fusion   RIGHT OOPHORECTOMY     scalenectomy     1985   Past Surgical History:  Procedure Laterality Date   APPENDECTOMY     1997   CERVICAL SPINE SURGERY     C4-5 spinal fusion   RIGHT OOPHORECTOMY     scalenectomy     1985   Past Medical History:  Diagnosis Date   Endometriosis    Hypertension    Migraines    Parkinson's disease (HCC)    Thyroid  disease    BP 113/76 (BP Location: Right Arm, Patient Position: Sitting, Cuff Size: Normal)   Pulse 97   Ht 5' 3 (1.6 m)   Wt 173 lb 6.4 oz (78.7 kg)   SpO2 98%   BMI 30.72 kg/m   Opioid Risk Score:   Fall Risk Score:  `1  Depression screen [facility] 2/9     12/14/2023   10:03 AM 11/14/2023    9:56 AM 10/10/2023    8:56 AM 10/08/2023   11:28 AM 08/29/2023   10:04 AM 07/13/2023    9:32 AM 06/05/2023    9:07 AM  Depression screen PHQ 2/9  Decreased Interest 3  3 1 3 3 1   Down, Depressed, Hopeless 3  2 1 3 3 1   PHQ - 2 Score 6  5 2 6 6 2   Altered sleeping  3 1      Tired, decreased energy  3 3      Change in appetite   1      Feeling bad or failure about yourself    0      Trouble concentrating   1      Moving slowly or fidgety/restless   1      Suicidal thoughts   0       PHQ-9 Score   12      Difficult doing work/chores   Somewhat difficult        Review of Systems  Musculoskeletal:        Neck Left arm back  Psychiatric/Behavioral:  Positive for dysphoric mood.   All other systems reviewed and are negative.      Objective:   Physical Exam Vitals and nursing note reviewed.  Constitutional:      Appearance: Normal appearance.  Neck:     Comments: Cervical Paraspinal Tenderness: C-5-C-6 Cardiovascular:     Rate and Rhythm: Normal rate and regular rhythm.  Pulmonary:     Effort: Pulmonary effort is normal.     Breath sounds: Normal breath sounds.  Musculoskeletal:     Comments: Normal Muscle Bulk and Muscle Testing Reveals:  Upper Extremities: Right: Decreased ROM 90 Degrees and Muscle Strength 4/5 Left Upper Extremity: Decreased ROM 30 Degrees and Muscle Strength 3/5 Left AC Joint Tenderness Thoracic Hypersensitivity: T-1-T-4 and T-6- T-8 Mainly on Left Side  Lower Extremities : Decreased ROM and Muscle Strength 5/5 Arises from Table slowly  Narrow Based Gait     Skin:    General: Skin is warm and dry.  Neurological:     Mental Status: She is alert and oriented to person, place, and time.  Psychiatric:        Mood and Affect: Mood normal.        Behavior: Behavior normal.          Assessment & Plan:  1.Cervicalgia/ Cervical Radiculitis:Continue HEP as Tolerated. Continue Pregabalin  .Continue to monitor. 12/14/2023 Left Arm Radicular Pain: Continue Pregabalin . Continue to Monitor.12/14/2023 2. Chronic Pain Syndrome: Continue Hydrocodone  10 mg/325 one tablet 5  times a day as needed for pain #150.Refill sent to accommodate scheduled appointment.  We will continue the opioid monitoring program, this consists of regular clinic visits, examinations, urine drug screen, pill counts as well as use of [place]  Controlled Substance Reporting system. A 12 month History has been reviewed on the [place]  Controlled Substance  Reporting System Today.   Continue current medication regimen. Continue to monitor. 12/14/2023 3. Left Hand Pain: Ortho Following. Continue to monitor. 12/14/2023 4. Chronic Thoracic Pain: No complaints today. Continue current medication regimen. Continue to monitor. 12/14/2023 5. Bilateral Greater Trochanter Bursitis: No complaints today.  Continue alternate Ice and Heat Therapy . Continue to monitor. 12/14/2023 6. Left Hand Tremor: Neurology Following. Continue to monitor. 12/14/2023 7. Fall: subsequent Encounter:Denies falls this month.  Educated on Falls Prevention: She verbalizes understanding. 12/14/2023   F/U in 1 month

## 2023-12-14 ENCOUNTER — Telehealth: Payer: Self-pay | Admitting: Registered Nurse

## 2023-12-14 ENCOUNTER — Encounter: Attending: Registered Nurse | Admitting: Registered Nurse

## 2023-12-14 ENCOUNTER — Encounter: Payer: Self-pay | Admitting: Registered Nurse

## 2023-12-14 VITALS — BP 113/76 | HR 97 | Ht 63.0 in | Wt 173.4 lb

## 2023-12-14 DIAGNOSIS — M542 Cervicalgia: Secondary | ICD-10-CM | POA: Insufficient documentation

## 2023-12-14 DIAGNOSIS — G894 Chronic pain syndrome: Secondary | ICD-10-CM | POA: Diagnosis present

## 2023-12-14 DIAGNOSIS — M25512 Pain in left shoulder: Secondary | ICD-10-CM | POA: Diagnosis present

## 2023-12-14 DIAGNOSIS — Z5181 Encounter for therapeutic drug level monitoring: Secondary | ICD-10-CM | POA: Diagnosis present

## 2023-12-14 DIAGNOSIS — G8929 Other chronic pain: Secondary | ICD-10-CM | POA: Insufficient documentation

## 2023-12-14 DIAGNOSIS — Z79891 Long term (current) use of opiate analgesic: Secondary | ICD-10-CM | POA: Diagnosis present

## 2023-12-14 DIAGNOSIS — M5412 Radiculopathy, cervical region: Secondary | ICD-10-CM | POA: Insufficient documentation

## 2023-12-14 DIAGNOSIS — M545 Low back pain, unspecified: Secondary | ICD-10-CM | POA: Insufficient documentation

## 2023-12-14 MED ORDER — HYDROCODONE-ACETAMINOPHEN 10-325 MG PO TABS: 1.0000 | ORAL_TABLET | Freq: Every day | ORAL | 0 refills | Status: DC | PRN

## 2023-12-14 NOTE — Telephone Encounter (Signed)
 Spoke with Dr Carilyn regarding Cervical MRI:  Recommended C7- T-1  ESI.  Called Bianca Goodman regarding Dr Carilyn recommendation, she refuses injection at this time.

## 2023-12-21 ENCOUNTER — Other Ambulatory Visit: Payer: Self-pay | Admitting: Family Medicine

## 2023-12-21 DIAGNOSIS — I10 Essential (primary) hypertension: Secondary | ICD-10-CM

## 2023-12-21 DIAGNOSIS — E039 Hypothyroidism, unspecified: Secondary | ICD-10-CM

## 2023-12-21 MED ORDER — LOSARTAN POTASSIUM 50 MG PO TABS: 50.0000 mg | ORAL_TABLET | Freq: Every day | ORAL | 3 refills | Status: AC

## 2023-12-21 MED ORDER — LEVOTHYROXINE SODIUM 88 MCG PO TABS: 88.0000 ug | ORAL_TABLET | Freq: Every day | ORAL | 3 refills | Status: AC

## 2023-12-21 NOTE — Telephone Encounter (Signed)
 Copied from CRM [id number]. Topic: Clinical - Medication Refill >> Dec 21, 2023  9:18 AM Franky GRADE wrote: Medication: losartan  (COZAAR ) 50 MG tablet [[id number]],$MzfnczAzqnmzIZPI_YTtXNlaaENxUzvmhhqBLaPnkcTxzZulY$$MzfnczAzqnmzIZPI_YTtXNlaaENxUzvmhhqBLaPnkcTxzZulY$  (SYNTHROID ) 88 MCG tablet [[id number]]  Has the patient contacted their pharmacy? No (Agent: If no, request that the patient contact the pharmacy for the refill. If patient does not wish to contact the pharmacy document the reason why and proceed with request.) (Agent: If yes, when and what did the pharmacy advise?)  This is the patient's preferred pharmacy:  [facility] [address] [place], [place] - [address] [place] [address] Phone: [phone] Fax: [phone]    Is this the correct pharmacy for this prescription? No If no, delete pharmacy and type the correct one.   Has the prescription been filled recently? No  Is the patient out of the medication? Yes  Has the patient been seen for an appointment in the last year OR does the patient have an upcoming appointment? Yes  Can we respond through MyChart? Yes  Agent: Please be advised that Rx refills may take up to 3 business days. We ask that you follow-up with your pharmacy.

## 2024-01-23 ENCOUNTER — Telehealth: Payer: Self-pay | Admitting: Family Medicine

## 2024-01-23 NOTE — Telephone Encounter (Signed)
 error

## 2024-02-14 ENCOUNTER — Telehealth: Payer: Self-pay | Admitting: Registered Nurse

## 2024-02-14 DIAGNOSIS — G894 Chronic pain syndrome: Secondary | ICD-10-CM

## 2024-02-14 DIAGNOSIS — G8929 Other chronic pain: Secondary | ICD-10-CM

## 2024-02-14 DIAGNOSIS — M542 Cervicalgia: Secondary | ICD-10-CM

## 2024-02-14 MED ORDER — HYDROCODONE-ACETAMINOPHEN 10-325 MG PO TABS: 1.0000 | ORAL_TABLET | Freq: Every day | ORAL | 0 refills | Status: DC | PRN

## 2024-02-14 NOTE — Telephone Encounter (Signed)
 Call placed to Bianca Goodman, she reports she has a fever, she will F/U with her PCP. PMP was reviewed Hydrocodone  e-scribed to pharmacy, she verbalizes understanding.

## 2024-02-14 NOTE — Telephone Encounter (Signed)
 Patient canceled her appointment tomorrow with Fidela, she said she was running a fever.  She wanted to be rescheduled 2 weeks out, but she needs her pain medication.  Please call patient.

## 2024-02-15 ENCOUNTER — Encounter: Admitting: Registered Nurse

## 2024-02-29 ENCOUNTER — Encounter: Payer: Self-pay | Admitting: Registered Nurse

## 2024-02-29 ENCOUNTER — Encounter: Attending: Registered Nurse | Admitting: Registered Nurse

## 2024-02-29 VITALS — BP 107/74 | HR 89 | Ht 62.0 in | Wt 175.0 lb

## 2024-02-29 DIAGNOSIS — M7062 Trochanteric bursitis, left hip: Secondary | ICD-10-CM | POA: Insufficient documentation

## 2024-02-29 DIAGNOSIS — M5412 Radiculopathy, cervical region: Secondary | ICD-10-CM | POA: Diagnosis present

## 2024-02-29 DIAGNOSIS — Z5181 Encounter for therapeutic drug level monitoring: Secondary | ICD-10-CM | POA: Diagnosis not present

## 2024-02-29 DIAGNOSIS — G894 Chronic pain syndrome: Secondary | ICD-10-CM | POA: Insufficient documentation

## 2024-02-29 DIAGNOSIS — M545 Low back pain, unspecified: Secondary | ICD-10-CM | POA: Diagnosis present

## 2024-02-29 DIAGNOSIS — M25512 Pain in left shoulder: Secondary | ICD-10-CM | POA: Insufficient documentation

## 2024-02-29 DIAGNOSIS — G8929 Other chronic pain: Secondary | ICD-10-CM | POA: Diagnosis present

## 2024-02-29 DIAGNOSIS — M542 Cervicalgia: Secondary | ICD-10-CM | POA: Insufficient documentation

## 2024-02-29 DIAGNOSIS — Z79891 Long term (current) use of opiate analgesic: Secondary | ICD-10-CM | POA: Insufficient documentation

## 2024-02-29 MED ORDER — HYDROCODONE-ACETAMINOPHEN 10-325 MG PO TABS: 1.0000 | ORAL_TABLET | Freq: Every day | ORAL | 0 refills | Status: DC | PRN

## 2024-02-29 NOTE — Progress Notes (Signed)
 Subjective:    Patient ID: Bianca Goodman, female    DOB: November 18, 1960, 63 y.o.   MRN: [id number]  HPI: Bianca Goodman is a 63 y.o. female who returns for follow up appointment for chronic pain and medication refill. She states her pain is located in her neck radiating into left shoulder and left hip pain.  She rates her pain 7. Her current exercise regime is walking short distances  and performing stretching exercises with bands.  [name] Morphine equivalent is 50.00 MME.   Oral Swab ws Performed today.    Pain Inventory Average Pain N/A Pain Right Now 7 My pain is sharp and burning  In the last 24 hours, has pain interfered with the following? General activity 7 Relation with others 7 Enjoyment of life 7 What TIME of day is your pain at its worst? morning  and daytime Sleep (in general) Poor  Pain is worse with: walking Pain improves with: rest, medication Relief from Meds: 6  Family History  Problem Relation Age of Onset   Cancer Mother    Heart disease Father    Heart disease Brother    Heart attack Brother        stent placement   Parkinson's disease Neg Hx    Social History   Socioeconomic History   Marital status: Widowed    Spouse name: Not on file   Number of children: 1   Years of education: Not on file   Highest education level: Not on file  Occupational History   Not on file  Tobacco Use   Smoking status: Former    Current packs/day: 0.00    Types: Cigarettes    Quit date: 09/20/2019    Years since quitting: 4.4   Smokeless tobacco: Never  Vaping Use   Vaping status: Never Used  Substance and Sexual Activity   Alcohol use: Not Currently   Drug use: Never   Sexual activity: Yes  Other Topics Concern   Not on file  Social History Narrative   Caffeine one cup daily, coffee with milk.   Lives with son.  Widow.  Education HS.     Social Drivers of Health   Financial Resource Strain: Medium Risk (09/25/2022)   Overall Financial Resource Strain (CARDIA)     Difficulty of Paying Living Expenses: Somewhat hard  Food Insecurity: No Food Insecurity (09/25/2022)   Hunger Vital Sign    Worried About Running Out of Food in the Last Year: Never true    Ran Out of Food in the Last Year: Never true  Transportation Needs: No Transportation Needs (09/15/2022)   PRAPARE - Administrator, Civil Service (Medical): No    Lack of Transportation (Non-Medical): No  Physical Activity: Not on file  Stress: Stress Concern Present (09/20/2022)   [employer] of Occupational Health - Occupational Stress Questionnaire    Feeling of Stress : To some extent  Social Connections: Not on file   Past Surgical History:  Procedure Laterality Date   APPENDECTOMY     1997   CERVICAL SPINE SURGERY     C4-5 spinal fusion   RIGHT OOPHORECTOMY     scalenectomy     1985   Past Surgical History:  Procedure Laterality Date   APPENDECTOMY     1997   CERVICAL SPINE SURGERY     C4-5 spinal fusion   RIGHT OOPHORECTOMY     scalenectomy     1985   Past Medical History:  Diagnosis  Date   Endometriosis    Hypertension    Migraines    Parkinson's disease (HCC)    Thyroid  disease    BP 107/74 (BP Location: Left Arm, Patient Position: Sitting, Cuff Size: Large)   Pulse 89   Ht 5' 2 (1.575 m)   Wt 175 lb (79.4 kg)   SpO2 95%   BMI 32.01 kg/m   Opioid Risk Score:   Fall Risk Score:  `1  Depression screen [facility] 2/9     12/14/2023   10:03 AM 11/14/2023    9:56 AM 10/10/2023    8:56 AM 10/08/2023   11:28 AM 08/29/2023   10:04 AM 07/13/2023    9:32 AM 06/05/2023    9:07 AM  Depression screen PHQ 2/9  Decreased Interest 3  3 1 3 3 1   Down, Depressed, Hopeless 3  2 1 3 3 1   PHQ - 2 Score 6  5 2 6 6 2   Altered sleeping  3 1      Tired, decreased energy  3 3      Change in appetite   1      Feeling bad or failure about yourself    0      Trouble concentrating   1      Moving slowly or fidgety/restless   1      Suicidal thoughts   0      PHQ-9 Score   12       Difficult doing work/chores   Somewhat difficult           Review of Systems  Musculoskeletal:  Positive for arthralgias, back pain, joint swelling and neck pain.       Neck, upper and lower back pain, left elbow pain  All other systems reviewed and are negative.      Objective:   Physical Exam Vitals and nursing note reviewed.  Constitutional:      Appearance: Normal appearance.  Neck:     Comments: Cervical Paraspinal Tenderness: C-5-C-6 Decrease Cervical ROM: Right and Left Rotation, Decreased Cervical Flexion and Extension  Cardiovascular:     Rate and Rhythm: Normal rate and regular rhythm.     Pulses: Normal pulses.     Heart sounds: Normal heart sounds.  Pulmonary:     Effort: Pulmonary effort is normal.     Breath sounds: Normal breath sounds.  Musculoskeletal:     Comments: Normal Muscle Bulk and Muscle Testing Reveals:  Upper Extremities: Decreased ROM and Muscle Strength 5/5 Left AC Joint Tenderness Thoracic Paraspinal Tenderness: T-1-T-4 Mainly Left Side   Lumbar Paraspinal Tenderness: L-4-L-5 Bilateral Greater Trochanter Tenderness Lower Extremities: Right: Full ROM and Muscle Strength 5/5 Left Lower Extremity: Decreased ROM and Muscle Strength 5/5 Left Lower Extremity Flexion Produces Pain into her her Left Patella Arises from Table slowly Antalgic  Gait     Skin:    General: Skin is warm and dry.  Neurological:     Mental Status: She is alert and oriented to person, place, and time.  Psychiatric:        Mood and Affect: Mood normal.        Behavior: Behavior normal.          Assessment & Plan:  1.Cervicalgia/ Cervical Radiculitis:Continue HEP as Tolerated. Continue Pregabalin  .Continue to monitor. 02/29/2024 Left Arm Radicular Pain: Continue Pregabalin . Continue to Monitor.02/29/2024 2. Chronic Pain Syndrome: Continue Hydrocodone  10 mg/325 one tablet 5  times a day as needed for pain #150.  We  will continue the opioid monitoring program,  this consists of regular clinic visits, examinations, urine drug screen, pill counts as well as use of [place]  Controlled Substance Reporting system. A 12 month History has been reviewed on the [place]  Controlled Substance Reporting System Today.   Continue current medication regimen. Continue to monitor. 02/29/2024 3. Left Hand Pain: Ortho Following. No complaints today. Continue to monitor. 02/29/2024 4. Chronic Thoracic Pain: No complaints today. Continue current medication regimen. Continue to monitor. 02/29/2024 5. Bilateral Greater Trochanter Bursitis: No complaints today.  Continue alternate Ice and Heat Therapy . Continue to monitor. 02/29/2024 6. Left Hand Tremor: Neurology Following. Continue to monitor. 02/29/2024 7. Fall: subsequent Encounter:Denies falls this month.  Educated on Falls Prevention: She verbalizes understanding. 02/29/2024   F/U in 1 month

## 2024-03-05 LAB — DRUG TOX MONITOR 1 W/CONF, ORAL FLD
Amphetamines: NEGATIVE ng/mL (ref <10)
Barbiturates: NEGATIVE ng/mL (ref <10)
Benzodiazepines: NEGATIVE ng/mL (ref <0.50)
Buprenorphine: NEGATIVE ng/mL (ref <0.10)
Cocaine: NEGATIVE ng/mL (ref <5.0)
Codeine: NEGATIVE ng/mL (ref <2.5)
Dihydrocodeine: 8.4 ng/mL — ABNORMAL HIGH (ref <2.5)
Fentanyl: NEGATIVE ng/mL (ref <0.10)
Heroin Metabolite: NEGATIVE ng/mL (ref <1.0)
Hydrocodone: 240.9 ng/mL — ABNORMAL HIGH (ref <2.5)
Hydromorphone: NEGATIVE ng/mL (ref <2.5)
MARIJUANA: NEGATIVE ng/mL (ref <2.5)
MDMA: NEGATIVE ng/mL (ref <10)
Meprobamate: NEGATIVE ng/mL (ref <2.5)
Methadone: NEGATIVE ng/mL (ref <5.0)
Morphine: NEGATIVE ng/mL (ref <2.5)
Nicotine Metabolite: NEGATIVE ng/mL (ref <5.0)
Norhydrocodone: 2.9 ng/mL — ABNORMAL HIGH (ref <2.5)
Noroxycodone: NEGATIVE ng/mL (ref <2.5)
Opiates: POSITIVE ng/mL — AB (ref <2.5)
Oxycodone: NEGATIVE ng/mL (ref <2.5)
Oxymorphone: NEGATIVE ng/mL (ref <2.5)
Phencyclidine: NEGATIVE ng/mL (ref <10)
Tapentadol: NEGATIVE ng/mL (ref <5.0)
Tramadol: NEGATIVE ng/mL (ref <5.0)
Zolpidem: NEGATIVE ng/mL (ref <5.0)

## 2024-03-05 LAB — DRUG TOX ALC METAB W/CON, ORAL FLD: Alcohol Metabolite: NEGATIVE ng/mL (ref <25)

## 2024-03-13 ENCOUNTER — Telehealth: Payer: Self-pay | Admitting: Registered Nurse

## 2024-03-13 NOTE — Telephone Encounter (Signed)
 Patient needs her pain medication filled today.  She can only get her son to pick it up today from pharmacy.  Pharmacy told her that you would need to call them in order for it to be filled today.

## 2024-03-13 NOTE — Telephone Encounter (Signed)
 PMP was Reviewed.  Pharmacy was called, Call placed to Bianca Goodman, no early refill , she verbalizes understanding.

## 2024-04-02 ENCOUNTER — Ambulatory Visit: Admitting: Nurse Practitioner

## 2024-04-10 ENCOUNTER — Telehealth: Payer: Self-pay | Admitting: Registered Nurse

## 2024-04-10 DIAGNOSIS — M545 Low back pain, unspecified: Secondary | ICD-10-CM

## 2024-04-10 DIAGNOSIS — M542 Cervicalgia: Secondary | ICD-10-CM

## 2024-04-10 DIAGNOSIS — G894 Chronic pain syndrome: Secondary | ICD-10-CM

## 2024-04-10 DIAGNOSIS — G8929 Other chronic pain: Secondary | ICD-10-CM

## 2024-04-10 MED ORDER — HYDROCODONE-ACETAMINOPHEN 10-325 MG PO TABS: 1.0000 | ORAL_TABLET | Freq: Every day | ORAL | 0 refills | Status: DC | PRN

## 2024-04-10 NOTE — Telephone Encounter (Signed)
 PDMP was Reviewed.  Hydrocodone  e-scribed to pharmacy, to accommodate scheduled appointment.

## 2024-04-10 NOTE — Telephone Encounter (Signed)
 Patient is canceling her appt with you tomorrow she is sick.  Her medication will run out.  When do you want to reschedule her to?

## 2024-04-11 ENCOUNTER — Encounter: Admitting: Registered Nurse

## 2024-04-11 ENCOUNTER — Telehealth: Payer: Self-pay

## 2024-04-11 NOTE — Telephone Encounter (Signed)
 PA FOR HYDROCODONE  SUBMITTED

## 2024-04-14 NOTE — Telephone Encounter (Signed)
 Hydrocodone  10/325 MG approved 04/11/2024 to 10/08/2024

## 2024-04-15 NOTE — Progress Notes (Unsigned)
 Subjective:    Patient ID: Bianca Goodman, female    DOB: 1961-01-30, 63 y.o.   MRN: [id number]  HPI: Bianca Goodman is a 63 y.o. female who returns for follow up appointment for chronic pain and medication refill. states *** pain is located in  ***. rates pain ***. current exercise regime is walking and performing stretching exercises.  [name] Morphine equivalent is *** MME.   Last Oral Swab was Performed on 02/29/2024, it was consistent.      Pain Inventory Average Pain 8 Pain Right Now 8 My pain is sharp, burning, and tingling  In the last 24 hours, has pain interfered with the following? General activity 7 Relation with others 7 Enjoyment of life 7 What TIME of day is your pain at its worst? morning  and daytime Sleep (in general) Poor  Pain is worse with: walking Pain improves with: rest, heat/ice, and medication Relief from Meds: 7  Family History  Problem Relation Age of Onset   Cancer Mother    Heart disease Father    Heart disease Brother    Heart attack Brother        stent placement   Parkinson's disease Neg Hx    Social History   Socioeconomic History   Marital status: Widowed    Spouse name: Not on file   Number of children: 1   Years of education: Not on file   Highest education level: Not on file  Occupational History   Not on file  Tobacco Use   Smoking status: Former    Current packs/day: 0.00    Types: Cigarettes    Quit date: 09/20/2019    Years since quitting: 4.5   Smokeless tobacco: Never  Vaping Use   Vaping status: Never Used  Substance and Sexual Activity   Alcohol use: Not Currently   Drug use: Never   Sexual activity: Yes  Other Topics Concern   Not on file  Social History Narrative   Caffeine one cup daily, coffee with milk.   Lives with son.  Widow.  Education HS.     Social Drivers of Health   Financial Resource Strain: Medium Risk (09/25/2022)   Overall Financial Resource Strain (CARDIA)    Difficulty of Paying Living  Expenses: Somewhat hard  Food Insecurity: No Food Insecurity (09/25/2022)   Hunger Vital Sign    Worried About Running Out of Food in the Last Year: Never true    Ran Out of Food in the Last Year: Never true  Transportation Needs: No Transportation Needs (09/15/2022)   PRAPARE - Administrator, Civil Service (Medical): No    Lack of Transportation (Non-Medical): No  Physical Activity: Not on file  Stress: Stress Concern Present (09/20/2022)   [employer] of Occupational Health - Occupational Stress Questionnaire    Feeling of Stress : To some extent  Social Connections: Not on file   Past Surgical History:  Procedure Laterality Date   APPENDECTOMY     1997   CERVICAL SPINE SURGERY     C4-5 spinal fusion   RIGHT OOPHORECTOMY     scalenectomy     1985   Past Surgical History:  Procedure Laterality Date   APPENDECTOMY     1997   CERVICAL SPINE SURGERY     C4-5 spinal fusion   RIGHT OOPHORECTOMY     scalenectomy     1985   Past Medical History:  Diagnosis Date   Endometriosis    Hypertension  Migraines    Parkinson's disease (HCC)    Thyroid  disease    There were no vitals taken for this visit.  Opioid Risk Score:   Fall Risk Score:  `1  Depression screen [facility] 2/9     12/14/2023   10:03 AM 11/14/2023    9:56 AM 10/10/2023    8:56 AM 10/08/2023   11:28 AM 08/29/2023   10:04 AM 07/13/2023    9:32 AM 06/05/2023    9:07 AM  Depression screen PHQ 2/9  Decreased Interest 3  3 1 3 3 1   Down, Depressed, Hopeless 3  2 1 3 3 1   PHQ - 2 Score 6  5 2 6 6 2   Altered sleeping  3 1      Tired, decreased energy  3 3      Change in appetite   1      Feeling bad or failure about yourself    0      Trouble concentrating   1      Moving slowly or fidgety/restless   1      Suicidal thoughts   0      PHQ-9 Score   12       Difficult doing work/chores   Somewhat difficult         Data saved with a previous flowsheet row definition    Review of Systems   Musculoskeletal:  Positive for back pain.       Pain in the left shoulder Left arm  All other systems reviewed and are negative.      Objective:   Physical Exam        Assessment & Plan:

## 2024-04-16 ENCOUNTER — Ambulatory Visit: Admitting: Family Medicine

## 2024-04-22 ENCOUNTER — Encounter: Payer: Self-pay | Admitting: Registered Nurse

## 2024-04-22 ENCOUNTER — Encounter: Attending: Registered Nurse | Admitting: Registered Nurse

## 2024-04-22 VITALS — BP 133/88 | HR 96 | Ht 62.0 in | Wt 173.0 lb

## 2024-04-22 DIAGNOSIS — G8929 Other chronic pain: Secondary | ICD-10-CM | POA: Diagnosis present

## 2024-04-22 DIAGNOSIS — Z5181 Encounter for therapeutic drug level monitoring: Secondary | ICD-10-CM | POA: Insufficient documentation

## 2024-04-22 DIAGNOSIS — M545 Low back pain, unspecified: Secondary | ICD-10-CM | POA: Diagnosis not present

## 2024-04-22 DIAGNOSIS — Z9181 History of falling: Secondary | ICD-10-CM | POA: Insufficient documentation

## 2024-04-22 DIAGNOSIS — M542 Cervicalgia: Secondary | ICD-10-CM | POA: Insufficient documentation

## 2024-04-22 DIAGNOSIS — Z79891 Long term (current) use of opiate analgesic: Secondary | ICD-10-CM | POA: Diagnosis present

## 2024-04-22 DIAGNOSIS — M546 Pain in thoracic spine: Secondary | ICD-10-CM | POA: Insufficient documentation

## 2024-04-22 DIAGNOSIS — M5416 Radiculopathy, lumbar region: Secondary | ICD-10-CM | POA: Insufficient documentation

## 2024-04-22 DIAGNOSIS — R251 Tremor, unspecified: Secondary | ICD-10-CM | POA: Insufficient documentation

## 2024-04-22 DIAGNOSIS — M25512 Pain in left shoulder: Secondary | ICD-10-CM | POA: Insufficient documentation

## 2024-04-22 DIAGNOSIS — M25542 Pain in joints of left hand: Secondary | ICD-10-CM | POA: Insufficient documentation

## 2024-04-22 DIAGNOSIS — M7061 Trochanteric bursitis, right hip: Secondary | ICD-10-CM | POA: Insufficient documentation

## 2024-04-22 DIAGNOSIS — M7062 Trochanteric bursitis, left hip: Secondary | ICD-10-CM | POA: Insufficient documentation

## 2024-04-22 DIAGNOSIS — G894 Chronic pain syndrome: Secondary | ICD-10-CM | POA: Insufficient documentation

## 2024-04-22 DIAGNOSIS — M5412 Radiculopathy, cervical region: Secondary | ICD-10-CM | POA: Diagnosis not present

## 2024-04-22 DIAGNOSIS — M25511 Pain in right shoulder: Secondary | ICD-10-CM | POA: Insufficient documentation

## 2024-04-22 MED ORDER — HYDROCODONE-ACETAMINOPHEN 10-325 MG PO TABS: 1.0000 | ORAL_TABLET | Freq: Every day | ORAL | 0 refills | Status: DC | PRN

## 2024-05-06 ENCOUNTER — Encounter: Payer: Self-pay | Admitting: Family Medicine

## 2024-05-06 ENCOUNTER — Ambulatory Visit: Admitting: Family Medicine

## 2024-05-06 VITALS — BP 123/80 | HR 98 | Temp 98.6°F | Ht 62.0 in | Wt 169.2 lb

## 2024-05-06 DIAGNOSIS — N368 Other specified disorders of urethra: Secondary | ICD-10-CM | POA: Diagnosis not present

## 2024-05-06 DIAGNOSIS — E039 Hypothyroidism, unspecified: Secondary | ICD-10-CM | POA: Diagnosis not present

## 2024-05-06 DIAGNOSIS — G20A2 Parkinson's disease without dyskinesia, with fluctuations: Secondary | ICD-10-CM

## 2024-05-06 DIAGNOSIS — I10 Essential (primary) hypertension: Secondary | ICD-10-CM | POA: Diagnosis not present

## 2024-05-06 DIAGNOSIS — E538 Deficiency of other specified B group vitamins: Secondary | ICD-10-CM

## 2024-05-06 MED ORDER — ALOE VESTA 2-N-1 PROTECTIVE EX OINT: TOPICAL_OINTMENT | Freq: Two times a day (BID) | CUTANEOUS | 0 refills | Status: AC | PRN

## 2024-05-06 MED ORDER — CYANOCOBALAMIN 1000 MCG/ML IJ SOLN: INTRAMUSCULAR | 3 refills | Status: AC

## 2024-05-06 MED ORDER — CYANOCOBALAMIN 1000 MCG/ML IJ SOLN
1000.0000 ug | Freq: Once | INTRAMUSCULAR | Status: AC
  Administered 2024-05-06: 16:00:00 1000 ug via INTRAMUSCULAR

## 2024-05-06 NOTE — Assessment & Plan Note (Signed)
Blood pressure is in good control. Continue losartan 50 mg daily and verapamil 120 mg bid.

## 2024-05-06 NOTE — Assessment & Plan Note (Signed)
 Bianca Goodman's vitamin B12 levels have remained low despite attempts at oral replacement. I will reassess her level today. I explained to Bianca Goodman about the how some patient's cannot absorb B12 from the intestinal tract and therefore need injections to achieve normal B12 levels. I do think this could have impacts on her tremors and unsteady gait. She seems unaware that we discussed this before. She does have transportation difficulties. I will have nursing provide her with a B12 injection today.  She feels she can give these injections at home. I will prescribe Vitamin B12 1,000 mcg weekly for 4 weeks and then once a month thereafter.

## 2024-05-06 NOTE — Progress Notes (Signed)
 [facility] [address] [place] [address] Dept: [phone] Dept Fax: [phone]  Office Visit  Subjective:    Patient ID: Bianca Goodman, female    DOB: 05/18/1961, 63 y.o..   MRN: [id number]  Chief Complaint  Patient presents with   Hypertension    F/u HTN and having balance issues.    History of Present Illness:  Patient is in today to discuss her losing balance due to parkinson's.   Ms. Boesch has a history of Parkinson's disease. She is managed by Dr. Buck (neurology). She is currently managed on pramipexole  1 mg TID and Sinemet  25-100 mg QID. She continues to note tremor, bradykinesia and postural instability. She also complains of sleep issues.   Ms. Hitz continues to work with [facility]) for her chronic pain management. She is managed on pregabalin  25 mg bid and Norco.   Ms. Mcclenney has a history of hypertension. She is managed on losartan  50 mg daily and verapamil  120 mg bid.   Ms. Akkerman has a history of hypothyroidism. She is managed on levothyroxine  88 mcg daily.    Ms. Slape has a history of prediabetes with A1cs bordering on diabetes.   Ms. Coggin has had low Vitamin B12 levels. I have attempted to have her start on Vitamin B12 injections, but she has not followed through on this. She is on an oral supplement, but her Vitamin B12 has remained low.  Past Medical History: Patient Active Problem List   Diagnosis Date Noted   Asthma, mild intermittent 11/13/2022   Vitamin D  deficiency 09/14/2022   Vitamin B12 deficiency 09/14/2022   Parkinson disease (HCC) 05/03/2021   Depression, major, single episode, moderate (HCC) 05/03/2021   Atypical squamous cell changes of undetermined significance (ASCUS) on cervical cytology with negative high risk human papilloma virus (HPV) test result 11/09/2020   Essential hypertension 10/28/2020   Hypothyroidism 10/28/2020    History of endometriosis 10/28/2020   Prediabetes 10/28/2020   Radicular pain in left arm 09/14/2020   Past Surgical History:  Procedure Laterality Date   APPENDECTOMY     1997   CERVICAL SPINE SURGERY     C4-5 spinal fusion   RIGHT OOPHORECTOMY     scalenectomy     1985   Family History  Problem Relation Age of Onset   Cancer Mother    Heart disease Father    Heart disease Brother    Heart attack Brother        stent placement   Parkinson's disease Neg Hx    Outpatient Medications Prior to Visit  Medication Sig Dispense Refill   albuterol  (VENTOLIN  HFA) 108 (90 Base) MCG/ACT inhaler Inhale 2 puffs into the lungs every 6 (six) hours as needed for wheezing or shortness of breath. 6.7 g 2   carbidopa -levodopa  (SINEMET  IR) 25-100 MG tablet Take 1 tablet by mouth 5 (five) times daily. 8:30, 12:30, 4:30 PM, 8:30 PM and 11 PM. 450 tablet 3   cyanocobalamin  (VITAMIN B12) 1000 MCG/ML injection INJECT 1 ML INTRAMUSCULARLY ONCE A WEEK FOR FOUR WEEKS THEN 1ML EVERY 28 DAYS     FLUoxetine  (PROZAC ) 10 MG capsule Take 1 capsule (10 mg total) by mouth daily. 90 capsule 3   HYDROcodone -acetaminophen  (NORCO) 10-325 MG tablet Take 1 tablet by mouth 5 (five) times daily as needed. Do not fill before 05/09/2024 150 tablet 0   hydroxypropyl methylcellulose / hypromellose (ISOPTO TEARS / GONIOVISC) 2.5 % ophthalmic solution Place  1 drop into both eyes 3 (three) times daily as needed for dry eyes. 15 mL 12   levothyroxine  (SYNTHROID ) 88 MCG tablet Take 1 tablet (88 mcg total) by mouth daily. 90 tablet 3   losartan  (COZAAR ) 50 MG tablet Take 1 tablet (50 mg total) by mouth daily. 90 tablet 3   Polyvinyl Alcohol-Povidone (ARTIFICIAL TEARS) 5-6 MG/ML SOLN Place 1 drop into both eyes 3 (three) times daily as needed. 30 mL 5   pramipexole  (MIRAPEX ) 1 MG tablet Take 1 tablet (1 mg total) by mouth 3 (three) times daily. 270 tablet 3   pregabalin  (LYRICA ) 25 MG capsule Take 1 capsule (25 mg total) by mouth at  bedtime. 30 capsule 3   verapamil  (CALAN ) 120 MG tablet Take 1 tablet (120 mg total) by mouth 2 (two) times daily. 180 tablet 3   Vitamin D , Ergocalciferol , (DRISDOL ) 1.25 MG (50000 UNIT) CAPS capsule Take 50,000 Units by mouth once a week.     No facility-administered medications prior to visit.   Allergies[1]   Objective:   Today's Vitals   05/06/24 1524  BP: 123/80  Pulse: 98  Temp: 98.6 F (37 C)  TempSrc: Temporal  SpO2: 99%  Weight: 169 lb 3.2 oz (76.7 kg)  Height: 5' 2 (1.575 m)   Body mass index is 30.95 kg/m.   General: Well developed, well nourished. No acute distress. Psych: Alert and oriented. Normal mood and affect.  Health Maintenance Due  Topic Date Due   Pneumococcal Vaccine: 50+ Years (1 of 2 - PCV) Never done   Mammogram  Never done   Colonoscopy  Never done   Zoster Vaccines- Shingrix (1 of 2) Never done   Influenza Vaccine  Never done   Lab Results Component Ref Range & Units (hover) 6 mo ago 10 mo ago 1 yr ago  Vitamin B-12 155 Low  107 Low  167 Low       Assessment & Plan:   Problem List Items Addressed This Visit       Cardiovascular and Mediastinum   Essential hypertension   Blood pressure is in good control. Continue losartan  50 mg daily and verapamil  120 mg bid.        Endocrine   Hypothyroidism   TSH is at goal. Continue levothyroxine  88 mcg daily.        Nervous and Auditory   Parkinson disease (HCC) - Primary   Stable. Continue pramipexole  1 mg TID and Sinemet  25-100 mg QID. She will continue to follow with neurology. I recommend she review her sleep issues with neurology. There is a risk for drug-drug interactions with her Parkinon's meds, pregabalin , and Vicodin. I would be cautious about taking additional sleep aids.        Other   Vitamin B12 deficiency   Ms. Spinola's vitamin B12 levels have remained low despite attempts at oral replacement. I will reassess her level today. I explained to Ms. Naraine about the how some  patient's cannot absorb B12 from the intestinal tract and therefore need injections to achieve normal B12 levels. I do think this could have impacts on her tremors and unsteady gait. She seems unaware that we discussed this before. She does have transportation difficulties. I will have nursing provide her with a B12 injection today.  She feels she can give these injections at home. I will prescribe Vitamin B12 1,000 mcg weekly for 4 weeks and then once a month thereafter.      Relevant Medications   cyanocobalamin  (  VITAMIN B12) 1000 MCG/ML injection   Other Relevant Orders   Vitamin B12   Other Visit Diagnoses       Urethral irritation       Ms. Goodnow notes irritation around her urethra. She rpeviously used a barrier cream and asks that this be renewed.   Relevant Medications   petrolatum-hydrophilic-aloe vera (ALOE VESTA) ointment       Return in about 3 months (around 08/04/2024) for Reassessment.    Garnette CHRISTELLA Simpler, MD  I,Emily Lagle,acting as a scribe for Garnette CHRISTELLA Simpler, MD.,have documented all relevant documentation on the behalf of Garnette CHRISTELLA Simpler, MD.  I, Garnette CHRISTELLA Simpler, MD, have reviewed all documentation for this visit. The documentation on 05/06/2024 for the exam, diagnosis, procedures, and orders are all accurate and complete.     [1]  Allergies Allergen Reactions   Cortizone-10 [Hydrocortisone] Rash   Tramadol  Nausea Only

## 2024-05-06 NOTE — Assessment & Plan Note (Addendum)
 Stable. Continue pramipexole  1 mg TID and Sinemet  25-100 mg QID. She will continue to follow with neurology. I recommend she review her sleep issues with neurology. There is a risk for drug-drug interactions with her Parkinon's meds, pregabalin , and Vicodin. I would be cautious about taking additional sleep aids.

## 2024-05-06 NOTE — Assessment & Plan Note (Signed)
 TSH is at goal. Continue levothyroxine 88 mcg daily.

## 2024-06-03 NOTE — Progress Notes (Unsigned)
 "  Subjective:    Patient ID: Bianca Goodman, female    DOB: 02-25-1961, 64 y.o.   MRN: [id number]  HPI   Pain Inventory Average Pain {NUMBERS; 0-10:5044} Pain Right Now {NUMBERS; 0-10:5044} My pain is {PAIN DESCRIPTION:21022940}  In the last 24 hours, has pain interfered with the following? General activity {NUMBERS; 0-10:5044} Relation with others {NUMBERS; 0-10:5044} Enjoyment of life {NUMBERS; 0-10:5044} What TIME of day is your pain at its worst? {time of day:24191} Sleep (in general) {BHH GOOD/FAIR/POOR:22877}  Pain is worse with: {ACTIVITIES:21022942} Pain improves with: {PAIN IMPROVES TPUY:78977056} Relief from Meds: {NUMBERS; 0-10:5044}  Family History  Problem Relation Age of Onset   Cancer Mother    Heart disease Father    Heart disease Brother    Heart attack Brother        stent placement   Parkinson's disease Neg Hx    Social History   Socioeconomic History   Marital status: Widowed    Spouse name: Not on file   Number of children: 1   Years of education: Not on file   Highest education level: Not on file  Occupational History   Not on file  Tobacco Use   Smoking status: Former    Current packs/day: 0.00    Types: Cigarettes    Quit date: 09/20/2019    Years since quitting: 4.7   Smokeless tobacco: Never  Vaping Use   Vaping status: Never Used  Substance and Sexual Activity   Alcohol use: Not Currently   Drug use: Never   Sexual activity: Yes  Other Topics Concern   Not on file  Social History Narrative   Caffeine one cup daily, coffee with milk.   Lives with son.  Widow.  Education HS.     Social Drivers of Health   Tobacco Use: Medium Risk (05/06/2024)   Patient History    Smoking Tobacco Use: Former    Smokeless Tobacco Use: Never    Passive Exposure: Not on file  Financial Resource Strain: Medium Risk (09/25/2022)   Overall Financial Resource Strain (CARDIA)    Difficulty of Paying Living Expenses: Somewhat hard  Food Insecurity: No Food  Insecurity (09/25/2022)   Hunger Vital Sign    Worried About Running Out of Food in the Last Year: Never true    Ran Out of Food in the Last Year: Never true  Transportation Needs: No Transportation Needs (09/15/2022)   PRAPARE - Administrator, Civil Service (Medical): No    Lack of Transportation (Non-Medical): No  Physical Activity: Not on file  Stress: Stress Concern Present (09/20/2022)   [employer] of Occupational Health - Occupational Stress Questionnaire    Feeling of Stress : To some extent  Social Connections: Not on file  Depression (PHQ2-9): Low Risk (04/22/2024)   Depression (PHQ2-9)    PHQ-2 Score: 2  Alcohol Screen: Not on file  Housing: Low Risk (09/25/2022)   Housing    Last Housing Risk Score: 0  Utilities: Not At Risk (09/25/2022)   AHC Utilities    Threatened with loss of utilities: No  Health Literacy: Not on file   Past Surgical History:  Procedure Laterality Date   APPENDECTOMY     1997   CERVICAL SPINE SURGERY     C4-5 spinal fusion   RIGHT OOPHORECTOMY     scalenectomy     1985   Past Surgical History:  Procedure Laterality Date   APPENDECTOMY     1997   CERVICAL SPINE  SURGERY     C4-5 spinal fusion   RIGHT OOPHORECTOMY     scalenectomy     1985   Past Medical History:  Diagnosis Date   Endometriosis    Hypertension    Migraines    Parkinson's disease (HCC)    Thyroid  disease    There were no vitals taken for this visit.  Opioid Risk Score:   Fall Risk Score:  `1  Depression screen [facility] 2/9     04/22/2024    3:00 PM 12/14/2023   10:03 AM 11/14/2023    9:56 AM 10/10/2023    8:56 AM 10/08/2023   11:28 AM 08/29/2023   10:04 AM 07/13/2023    9:32 AM  Depression screen PHQ 2/9  Decreased Interest 1 3  3 1 3 3   Down, Depressed, Hopeless 1 3  2 1 3 3   PHQ - 2 Score 2 6  5 2 6 6   Altered sleeping   3 1     Tired, decreased energy   3 3     Change in appetite    1     Feeling bad or failure about yourself     0     Trouble  concentrating    1     Moving slowly or fidgety/restless    1     Suicidal thoughts    0     PHQ-9 Score    12      Difficult doing work/chores    Somewhat difficult        Data saved with a previous flowsheet row definition    Review of Systems     Objective:   Physical Exam        Assessment & Plan:    "

## 2024-06-04 ENCOUNTER — Telehealth: Payer: Self-pay | Admitting: Registered Nurse

## 2024-06-04 ENCOUNTER — Encounter: Admitting: Registered Nurse

## 2024-06-04 DIAGNOSIS — G894 Chronic pain syndrome: Secondary | ICD-10-CM

## 2024-06-04 DIAGNOSIS — G8929 Other chronic pain: Secondary | ICD-10-CM

## 2024-06-04 DIAGNOSIS — M542 Cervicalgia: Secondary | ICD-10-CM

## 2024-06-04 DIAGNOSIS — M545 Low back pain, unspecified: Secondary | ICD-10-CM

## 2024-06-04 MED ORDER — HYDROCODONE-ACETAMINOPHEN 10-325 MG PO TABS: 1.0000 | ORAL_TABLET | Freq: Every day | ORAL | 0 refills | Status: DC | PRN

## 2024-06-04 NOTE — Telephone Encounter (Signed)
 Patient canceled her appt with Fidela today due to fever and sore throat.  She wanted to reschedule on 1/29.  She will be out of medication.  Please call patient.

## 2024-06-04 NOTE — Telephone Encounter (Signed)
 PDMP was Reviewed.  Hydrocodone  e-scribed to pharmacy.  Call placed to Ms. Sharpless regarding the above, she verbalizes understanding.

## 2024-06-10 NOTE — Progress Notes (Unsigned)
 " [facility] [address] [place]. [place] [address] [phone]       OFFICE FOLLOW UP NOTE  [name] Date of Birth:  May 19, 1961 Medical Record Number:  [id number]    Primary neurologist: Dr. Buck Reason for visit: Parkinsonism    SUBJECTIVE:   CHIEF COMPLAINT:  No chief complaint on file.  Follow-up visit:  Prior visit: 12/03/2023 with Dr. Buck   HPI:   Bianca Goodman is a 64 y.o. female who is routinely followed in office for left-sided predominance parkinsonism with symptom onset around 2021.  Initially started on pramipexole  in 2022 and started on carbidopa  levodopa  06/2022 with complaints of worsening tremor and stiffness.   At prior visit Dr. Buck, complained of worsening symptoms therefore increased carbidopa  levodopa  to 1 pill to 5 times daily and continued on pramipexole  1 pill TID.  Discussed intermittent hallucinations possibly due to PD vs narcotic pain medication use and advised follow-up with pain management provider.  Also discussed discussing increasing fluoxetine  with PCP for depressive symptoms.  Interval history:       Patient unaccompanied today, declined interpreter services. Overall stable since prior visit. She apparently did not increase Sinemet  dosage as previously advised, she continues to take 1 tab upon awakening, then 1pm and 7pm, takes pramipexole  at the same times. She does c/o increased stiffness and gait difficulty, essentially unchanged since prior visit.  She questions use of RW, currently without use of AD, denies any recent falls. She does have difficulty standing from seated position especially from places like her bed and toilet as they do not have hand rails. Continues imbalance greater when looking up or down such as drinking water. Swallowing difficulty persists but denies worsening. Her main concern today is in regards to continued neck pain, she is currently being followed by PMR for pain management but  denies much improvement, neck pain greatly interferes with daily activity as well as sleep.     ROS:   14 system review of systems performed and negative with exception of those listed in HPI  PMH:  Past Medical History:  Diagnosis Date   Endometriosis    Hypertension    Migraines    Parkinson's disease (HCC)    Thyroid  disease     PSH:  Past Surgical History:  Procedure Laterality Date   APPENDECTOMY     1997   CERVICAL SPINE SURGERY     C4-5 spinal fusion   RIGHT OOPHORECTOMY     scalenectomy     1985    Social History:  Social History   Socioeconomic History   Marital status: Widowed    Spouse name: Not on file   Number of children: 1   Years of education: Not on file   Highest education level: Not on file  Occupational History   Not on file  Tobacco Use   Smoking status: Former    Current packs/day: 0.00    Types: Cigarettes    Quit date: 09/20/2019    Years since quitting: 4.7   Smokeless tobacco: Never  Vaping Use   Vaping status: Never Used  Substance and Sexual Activity   Alcohol use: Not Currently   Drug use: Never   Sexual activity: Yes  Other Topics Concern   Not on file  Social History Narrative   Caffeine one cup daily, coffee with milk.   Lives with son.  Widow.  Education HS.     Social Drivers of Health   Tobacco Use: Medium Risk (  05/06/2024)   Patient History    Smoking Tobacco Use: Former    Smokeless Tobacco Use: Never    Passive Exposure: Not on file  Financial Resource Strain: Medium Risk (09/25/2022)   Overall Financial Resource Strain (CARDIA)    Difficulty of Paying Living Expenses: Somewhat hard  Food Insecurity: No Food Insecurity (09/25/2022)   Hunger Vital Sign    Worried About Running Out of Food in the Last Year: Never true    Ran Out of Food in the Last Year: Never true  Transportation Needs: No Transportation Needs (09/15/2022)   PRAPARE - Administrator, Civil Service (Medical): No    Lack of  Transportation (Non-Medical): No  Physical Activity: Not on file  Stress: Stress Concern Present (09/20/2022)   [employer] of Occupational Health - Occupational Stress Questionnaire    Feeling of Stress : To some extent  Social Connections: Not on file  Intimate Partner Violence: Not At Risk (09/25/2022)   Humiliation, Afraid, Rape, and Kick questionnaire    Fear of Current or Ex-Partner: No    Emotionally Abused: No    Physically Abused: No    Sexually Abused: No  Depression (PHQ2-9): Low Risk (04/22/2024)   Depression (PHQ2-9)    PHQ-2 Score: 2  Alcohol Screen: Not on file  Housing: Low Risk (09/25/2022)   Housing    Last Housing Risk Score: 0  Utilities: Not At Risk (09/25/2022)   AHC Utilities    Threatened with loss of utilities: No  Health Literacy: Not on file    Family History:  Family History  Problem Relation Age of Onset   Cancer Mother    Heart disease Father    Heart disease Brother    Heart attack Brother        stent placement   Parkinson's disease Neg Hx     Medications:   Current Outpatient Medications on File Prior to Visit  Medication Sig Dispense Refill   albuterol  (VENTOLIN  HFA) 108 (90 Base) MCG/ACT inhaler Inhale 2 puffs into the lungs every 6 (six) hours as needed for wheezing or shortness of breath. 6.7 g 2   carbidopa -levodopa  (SINEMET  IR) 25-100 MG tablet Take 1 tablet by mouth 5 (five) times daily. 8:30, 12:30, 4:30 PM, 8:30 PM and 11 PM. 450 tablet 3   cyanocobalamin  (VITAMIN B12) 1000 MCG/ML injection Inject 1 mL (1,000 mcg total) into the muscle once a week for 28 days, THEN 1 mL (1,000 mcg total) every 30 (thirty) days. 10 mL 3   FLUoxetine  (PROZAC ) 10 MG capsule Take 1 capsule (10 mg total) by mouth daily. 90 capsule 3   HYDROcodone -acetaminophen  (NORCO) 10-325 MG tablet Take 1 tablet by mouth 5 (five) times daily as needed. Do not fill before 06/05/2024 150 tablet 0   hydroxypropyl methylcellulose / hypromellose (ISOPTO TEARS / GONIOVISC)  2.5 % ophthalmic solution Place 1 drop into both eyes 3 (three) times daily as needed for dry eyes. 15 mL 12   levothyroxine  (SYNTHROID ) 88 MCG tablet Take 1 tablet (88 mcg total) by mouth daily. 90 tablet 3   losartan  (COZAAR ) 50 MG tablet Take 1 tablet (50 mg total) by mouth daily. 90 tablet 3   petrolatum-hydrophilic-aloe vera (ALOE VESTA) ointment Apply topically 2 (two) times daily as needed for wound care. 226 g 0   Polyvinyl Alcohol-Povidone (ARTIFICIAL TEARS) 5-6 MG/ML SOLN Place 1 drop into both eyes 3 (three) times daily as needed. 30 mL 5   pramipexole  (MIRAPEX ) 1 MG tablet  Take 1 tablet (1 mg total) by mouth 3 (three) times daily. 270 tablet 3   pregabalin  (LYRICA ) 25 MG capsule Take 1 capsule (25 mg total) by mouth at bedtime. 30 capsule 3   verapamil  (CALAN ) 120 MG tablet Take 1 tablet (120 mg total) by mouth 2 (two) times daily. 180 tablet 3   Vitamin D , Ergocalciferol , (DRISDOL ) 1.25 MG (50000 UNIT) CAPS capsule Take 50,000 Units by mouth once a week.     No current facility-administered medications on file prior to visit.    Allergies:   Allergies  Allergen Reactions   Cortizone-10 [Hydrocortisone] Rash   Tramadol  Nausea Only      OBJECTIVE:  Physical Exam  There were no vitals filed for this visit.  There is no height or weight on file to calculate BMI. No results found.  General: well developed, well nourished, very pleasant middle-aged female, seated, in no evident distress  Neurologic Exam Mental Status: Awake and fully alert.  Fluent speech and language.  Oriented to place and time. Recent and remote memory intact. Attention span, concentration and fund of knowledge appropriate. Mood and affect appropriate. Mild to moderate facial masking.  Cranial Nerves: Pupils equal, briskly reactive to light. Extraocular movements full without nystagmus. Visual fields full to confrontation. Hearing intact. Facial sensation intact. Face, tongue, palate moves normally and  symmetrically.  Motor: Full strength in all tested extremities.  Increased tone LUE>RUE with mild cogwheel rigidity, difficulty extending fingers with increased stiffness and pain L>R, and mild increased tone in LLE.  Intermittent mild resting tremor in LUE, no significant postural action tremor.  Moderately decreased fine motor LUE, mild on RUE. Sensory.: intact to touch , pinprick , position and vibratory sensation.  Gait and Station: Arises from chair with mild difficulty. Stance is hunched. Gait demonstrates decreased stride length and step height bilaterally with mild unsteadiness, decreased arm swing L>R.  Tandem walk and heel toe not attempted. Reflexes: 1+ and symmetric. Toes downgoing.       ASSESSMENT/PLAN: Bianca Goodman is a 64 y.o. year old female with left-sided predominance parkinsonism with symptoms starting around 2021.  Prior concern of gradual worsening of tremor, stiffness and freezing spells, previously recommended increasing Sinemet  dosage to 1 pill 4 times daily but remains on 1 pill 3 times daily. Continues to have issues with tremor, stiffness and freezing spells but denies worsening since prior visit.    -Recommend increasing Sinemet  to 1 pill 4 times daily as previously advised -Continue pramipexole  1 pill 3 times daily -Referral placed to PT for Parkinson's disease, they will determine if AD required, provided DME for toilet safety rails -Continue to follow with pain management, advised to discuss with PCP referral to different location for second opinion as she continues to have significant pain which interferes with daily functioning and sleep    Follow up in 6 months or call earlier if needed   CC:  PCP: Thedora Garnette HERO, MD       Harlene Bogaert, AGNP-BC  [facility] [address] [place], [place] [address]  Phone [phone] Fax [phone] Note: This document was prepared with digital dictation and possible smart  phrase technology. Any transcriptional errors that result from this process are unintentional.       "

## 2024-06-11 ENCOUNTER — Ambulatory Visit: Admitting: Adult Health

## 2024-06-11 ENCOUNTER — Encounter: Payer: Self-pay | Admitting: Adult Health

## 2024-06-11 VITALS — BP 103/66 | HR 91 | Ht 62.0 in | Wt 176.0 lb

## 2024-06-11 DIAGNOSIS — G479 Sleep disorder, unspecified: Secondary | ICD-10-CM

## 2024-06-11 DIAGNOSIS — G20A2 Parkinson's disease without dyskinesia, with fluctuations: Secondary | ICD-10-CM

## 2024-06-11 DIAGNOSIS — G4752 REM sleep behavior disorder: Secondary | ICD-10-CM

## 2024-06-11 NOTE — Patient Instructions (Addendum)
 Your Plan:  Start home health therapy - you will be called to get this set up  Increase Sinemet  to 1 tab every 4 hours to further help with your symptoms  Continue Mirapex  twice daily for now but after a couple weeks of increased Sinemet  dose, increase to 3 times daily if needed  Please discuss ongoing depression and adjustment of antidepressant medication with your PCP at your next visit  Continue to follow with your pain management provider as scheduled   Would recommend trying melatonin 1-2 hours prior to bedtime to help with sleep and REM sleep disorder. Start at 2mg  nightly and gradually increase every couple of weeks as needed to max of 12mg  nightly        Follow up in 6 months or call earlier if needed       Thank you for coming to see us  at [facility]. I hope we have been able to provide you high quality care today.  You may receive a patient satisfaction survey over the next few weeks. We would appreciate your feedback and comments so that we may continue to improve ourselves and the health of our patients.

## 2024-06-18 ENCOUNTER — Telehealth: Payer: Self-pay | Admitting: Registered Nurse

## 2024-06-18 NOTE — Telephone Encounter (Signed)
 Patient called and stated she is sick and can't make a appt tomorrow and wants to know is it okay to do a phone visit instead.

## 2024-06-19 ENCOUNTER — Encounter: Payer: Self-pay | Admitting: Registered Nurse

## 2024-06-19 ENCOUNTER — Encounter: Attending: Registered Nurse | Admitting: Registered Nurse

## 2024-06-19 DIAGNOSIS — M25512 Pain in left shoulder: Secondary | ICD-10-CM | POA: Insufficient documentation

## 2024-06-19 DIAGNOSIS — M5412 Radiculopathy, cervical region: Secondary | ICD-10-CM | POA: Diagnosis not present

## 2024-06-19 DIAGNOSIS — M542 Cervicalgia: Secondary | ICD-10-CM | POA: Insufficient documentation

## 2024-06-19 DIAGNOSIS — G894 Chronic pain syndrome: Secondary | ICD-10-CM | POA: Insufficient documentation

## 2024-06-19 DIAGNOSIS — M545 Low back pain, unspecified: Secondary | ICD-10-CM | POA: Diagnosis not present

## 2024-06-19 DIAGNOSIS — Z5181 Encounter for therapeutic drug level monitoring: Secondary | ICD-10-CM | POA: Insufficient documentation

## 2024-06-19 DIAGNOSIS — Z79891 Long term (current) use of opiate analgesic: Secondary | ICD-10-CM | POA: Diagnosis not present

## 2024-06-19 DIAGNOSIS — G8929 Other chronic pain: Secondary | ICD-10-CM | POA: Insufficient documentation

## 2024-06-19 MED ORDER — PREGABALIN 25 MG PO CAPS: 25.0000 mg | ORAL_CAPSULE | Freq: Every day | ORAL | 3 refills | Status: AC

## 2024-06-19 MED ORDER — HYDROCODONE-ACETAMINOPHEN 10-325 MG PO TABS: 1.0000 | ORAL_TABLET | Freq: Every day | ORAL | 0 refills | Status: AC | PRN

## 2024-06-19 NOTE — Progress Notes (Signed)
 "  Subjective:    Patient ID: Bianca Goodman, female    DOB: [birth year], 64 y.o.   MRN: [id number]  HPI: Bianca Goodman is a 64 y.o. female whose appointment was changed to telephone visit, she called the office reporting increase intensity of cervical radicular pain with weather change, her appointment was changed to a telephone visit.  I connected with Bianca Goodman  by telephone and verified that I am speaking with the correct person using two identifiers.  Location: Patient: In her Home  Provider: In the office    I discussed the limitations, risks, security and privacy concerns of performing an evaluation and management service by telephone and the availability of in person appointments. I also discussed with the patient that there may be a patient responsible charge related to this service. The patient expressed understanding and agreed to proceed.   She states her pain is located in her neck radiating into her left shoulder, mid- lower back. She rates her pain 6. Her current exercise regime is walking and performing stretching exercises.  Bianca Goodman is 50.00 MME.   Last oral swab was performed on 02/29/2024, it was consistent.    Pain Inventory Average Pain 6 Pain Right Now 6 My pain is constant, sharp, burning, stabbing, tingling, and aching  In the last 24 hours, has pain interfered with the following? General activity 9 Relation with others 9 Enjoyment of life 9 What TIME of day is your pain at its worst? morning , daytime, evening, and night Sleep (in general) Poor  Pain is worse with: walking, bending, sitting, standing, and some activites Pain improves with: rest and medication Relief from Meds: 6  Family History  Problem Relation Age of Onset   Cancer Mother    Heart disease Father    Heart disease Brother    Heart attack Brother        stent placement   Parkinson's disease Neg Hx    Social History   Socioeconomic History   Marital status: Widowed     Spouse name: Not on file   Number of children: 1   Years of education: Not on file   Highest education level: Not on file  Occupational History   Not on file  Tobacco Use   Smoking status: Former    Current packs/day: 0.00    Types: Cigarettes    Quit date: 09/20/2019    Years since quitting: 4.7   Smokeless tobacco: Never  Vaping Use   Vaping status: Never Used  Substance and Sexual Activity   Alcohol use: Not Currently   Drug use: Never   Sexual activity: Yes  Other Topics Concern   Not on file  Social History Narrative   Caffeine one cup daily, coffee with milk.   Lives with son.  Widow.  Education HS.     Social Drivers of Health   Tobacco Use: Medium Risk (06/11/2024)   Patient History    Smoking Tobacco Use: Former    Smokeless Tobacco Use: Never    Passive Exposure: Not on file  Financial Resource Strain: Medium Risk (09/25/2022)   Overall Financial Resource Strain (CARDIA)    Difficulty of Paying Living Expenses: Somewhat hard  Food Insecurity: No Food Insecurity (09/25/2022)   Hunger Vital Sign    Worried About Running Out of Food in the Last Year: Never true    Ran Out of Food in the Last Year: Never true  Transportation Needs: No Transportation Needs (09/15/2022)  PRAPARE - Administrator, Civil Service (Medical): No    Lack of Transportation (Non-Medical): No  Physical Activity: Not on file  Stress: Stress Concern Present (09/20/2022)   [employer] of Occupational Health - Occupational Stress Questionnaire    Feeling of Stress : To some extent  Social Connections: Not on file  Depression (PHQ2-9): Low Risk (04/22/2024)   Depression (PHQ2-9)    PHQ-2 Score: 2  Alcohol Screen: Not on file  Housing: Low Risk (09/25/2022)   Housing    Last Housing Risk Score: 0  Utilities: Not At Risk (09/25/2022)   [employer] Utilities    Threatened with loss of utilities: No  Health Literacy: Not on file   Past Surgical History:  Procedure Laterality Date    APPENDECTOMY     1997   CERVICAL SPINE SURGERY     C4-5 spinal fusion   RIGHT OOPHORECTOMY     scalenectomy     1985   Past Surgical History:  Procedure Laterality Date   APPENDECTOMY     1997   CERVICAL SPINE SURGERY     C4-5 spinal fusion   RIGHT OOPHORECTOMY     scalenectomy     1985   Past Medical History:  Diagnosis Date   Endometriosis    Hypertension    Migraines    Parkinson's disease (HCC)    Thyroid  disease    There were no vitals taken for this visit.  Opioid Risk Score:   Fall Risk Score:  `1  Depression screen [facility] 2/9     04/22/2024    3:00 PM 12/14/2023   10:03 AM 11/14/2023    9:56 AM 10/10/2023    8:56 AM 10/08/2023   11:28 AM 08/29/2023   10:04 AM 07/13/2023    9:32 AM  Depression screen PHQ 2/9  Decreased Interest 1 3  3 1 3 3   Down, Depressed, Hopeless 1 3  2 1 3 3   PHQ - 2 Score 2 6  5 2 6 6   Altered sleeping   3 1     Tired, decreased energy   3 3     Change in appetite    1     Feeling bad or failure about yourself     0     Trouble concentrating    1     Moving slowly or fidgety/restless    1     Suicidal thoughts    0     PHQ-9 Score    12      Difficult doing work/chores    Somewhat difficult        Data saved with a previous flowsheet row definition      Review of Systems  Musculoskeletal:  Positive for back pain and neck pain.       Patient reports neck pain and back pain  All other systems reviewed and are negative.      Objective:   Physical Exam Vitals and nursing note reviewed.  Musculoskeletal:     Comments: No Physical Exam Performed: Telephone Visit           Assessment & Plan:  1.Cervicalgia/ Cervical Radiculitis:Reports increase intensity of Cervical Radicular pain, will place referral to neurosurgery, she is in agreement and verbalizes understanding. Continue HEP as Tolerated. RX: Pregabalin  .Continue to monitor. 06/19/2024 Left Arm Radicular Pain: Continue Pregabalin . Continue to Monitor.06/19/2024 2.  Chronic Pain Syndrome: Continue Hydrocodone  10 mg/325 one tablet 5  times a day as needed for  pain #150.  Second script We will continue the opioid monitoring program, this consists of regular clinic visits, examinations, urine drug screen, pill counts as well as use of [place]  Controlled Substance Reporting system. A 12 month History has been reviewed on the [place]  Controlled Substance Reporting System Today.   Continue current medication regimen. Continue to monitor. 06/19/2024 3. Left Hand Pain: Ortho Following. No complaints today. Continue to monitor. 06/19/2024 4. Chronic Thoracic Pain: /Left Lumbar Radiculitis: Continue current medication regimen. Continue to monitor. 06/19/2024 5. Bilateral Greater Trochanter Bursitis: No complaints today.  Continue alternate Ice and Heat Therapy . Continue to monitor. 06/19/2024 6. Left Hand Tremor: Neurology Following. Continue to monitor. 06/19/2024 7. Fall: subsequent Encounter:Denies falls this month.  Educated on Falls Prevention: She verbalizes understanding. 06/19/2024   F/U in 1 month   Telephone encounter Established Patient Location of Patient: In her Home  Location of Provider: In the Office  Total Time spent: 20 minutes   "

## 2024-07-28 ENCOUNTER — Encounter: Admitting: Registered Nurse

## 2024-08-07 ENCOUNTER — Ambulatory Visit: Admitting: Family Medicine

## 2025-01-06 ENCOUNTER — Ambulatory Visit: Admitting: Adult Health
